# Patient Record
Sex: Female | Born: 1950 | Race: White | Hispanic: No | Smoking: Never smoker
Health system: Southern US, Community
[De-identification: ages and names within clinical notes are randomized; demographics above are authoritative.]

## PROBLEM LIST (undated history)

## (undated) DIAGNOSIS — J329 Chronic sinusitis, unspecified: Secondary | ICD-10-CM

## (undated) DIAGNOSIS — R079 Chest pain, unspecified: Secondary | ICD-10-CM

## (undated) DIAGNOSIS — E785 Hyperlipidemia, unspecified: Secondary | ICD-10-CM

## (undated) DIAGNOSIS — I050 Rheumatic mitral stenosis: Secondary | ICD-10-CM

## (undated) DIAGNOSIS — Z8719 Personal history of other diseases of the digestive system: Secondary | ICD-10-CM

## (undated) DIAGNOSIS — I77810 Thoracic aortic ectasia: Secondary | ICD-10-CM

## (undated) DIAGNOSIS — M797 Fibromyalgia: Secondary | ICD-10-CM

## (undated) DIAGNOSIS — N135 Crossing vessel and stricture of ureter without hydronephrosis: Secondary | ICD-10-CM

## (undated) DIAGNOSIS — M8580 Other specified disorders of bone density and structure, unspecified site: Secondary | ICD-10-CM

## (undated) DIAGNOSIS — M1990 Unspecified osteoarthritis, unspecified site: Secondary | ICD-10-CM

## (undated) DIAGNOSIS — I700 Atherosclerosis of aorta: Secondary | ICD-10-CM

## (undated) DIAGNOSIS — I6529 Occlusion and stenosis of unspecified carotid artery: Secondary | ICD-10-CM

## (undated) DIAGNOSIS — I350 Nonrheumatic aortic (valve) stenosis: Secondary | ICD-10-CM

## (undated) DIAGNOSIS — R011 Cardiac murmur, unspecified: Secondary | ICD-10-CM

## (undated) DIAGNOSIS — E039 Hypothyroidism, unspecified: Secondary | ICD-10-CM

## (undated) DIAGNOSIS — I38 Endocarditis, valve unspecified: Secondary | ICD-10-CM

## (undated) DIAGNOSIS — J189 Pneumonia, unspecified organism: Secondary | ICD-10-CM

## (undated) DIAGNOSIS — I351 Nonrheumatic aortic (valve) insufficiency: Secondary | ICD-10-CM

## (undated) DIAGNOSIS — I77819 Aortic ectasia, unspecified site: Secondary | ICD-10-CM

## (undated) DIAGNOSIS — Z87440 Personal history of urinary (tract) infections: Secondary | ICD-10-CM

## (undated) DIAGNOSIS — I10 Essential (primary) hypertension: Secondary | ICD-10-CM

## (undated) HISTORY — PX: OTHER SURGICAL HISTORY: SHX169

## (undated) HISTORY — DX: Occlusion and stenosis of unspecified carotid artery: I65.29

## (undated) HISTORY — DX: Nonrheumatic aortic (valve) insufficiency: I35.1

## (undated) HISTORY — DX: Thoracic aortic ectasia: I77.810

## (undated) HISTORY — PX: JOINT REPLACEMENT: SHX530

## (undated) HISTORY — DX: Aortic ectasia, unspecified site: I77.819

## (undated) HISTORY — PX: BUNIONECTOMY: SHX129

## (undated) HISTORY — DX: Other specified disorders of bone density and structure, unspecified site: M85.80

## (undated) HISTORY — PX: TUBAL LIGATION: SHX77

## (undated) HISTORY — DX: Rheumatic mitral stenosis: I05.0

## (undated) HISTORY — DX: Atherosclerosis of aorta: I70.0

## (undated) HISTORY — PX: CARDIAC CATHETERIZATION: SHX172

---
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
## 1976-09-04 HISTORY — PX: APPENDECTOMY: SHX54

## 2011-04-23 ENCOUNTER — Emergency Department (HOSPITAL_COMMUNITY)
Admission: EM | Admit: 2011-04-23 | Discharge: 2011-04-23 | Disposition: A | Discharge status: Home / Self Care | Payer: PRIVATE HEALTH INSURANCE | Attending: Emergency Medicine | Admitting: Emergency Medicine

## 2011-04-23 ENCOUNTER — Emergency Department (HOSPITAL_COMMUNITY): Payer: PRIVATE HEALTH INSURANCE

## 2011-04-23 DIAGNOSIS — IMO0002 Reserved for concepts with insufficient information to code with codable children: Secondary | ICD-10-CM | POA: Insufficient documentation

## 2011-04-23 DIAGNOSIS — M549 Dorsalgia, unspecified: Secondary | ICD-10-CM | POA: Insufficient documentation

## 2011-04-23 DIAGNOSIS — I10 Essential (primary) hypertension: Secondary | ICD-10-CM | POA: Insufficient documentation

## 2011-04-23 DIAGNOSIS — E039 Hypothyroidism, unspecified: Secondary | ICD-10-CM | POA: Insufficient documentation

## 2011-04-23 DIAGNOSIS — Z79899 Other long term (current) drug therapy: Secondary | ICD-10-CM | POA: Insufficient documentation

## 2011-04-23 DIAGNOSIS — E119 Type 2 diabetes mellitus without complications: Secondary | ICD-10-CM | POA: Insufficient documentation

## 2016-09-13 DIAGNOSIS — I10 Essential (primary) hypertension: Secondary | ICD-10-CM | POA: Diagnosis not present

## 2016-09-13 DIAGNOSIS — E785 Hyperlipidemia, unspecified: Secondary | ICD-10-CM | POA: Diagnosis not present

## 2016-09-13 DIAGNOSIS — M5412 Radiculopathy, cervical region: Secondary | ICD-10-CM | POA: Diagnosis not present

## 2016-09-13 DIAGNOSIS — Z6833 Body mass index (BMI) 33.0-33.9, adult: Secondary | ICD-10-CM | POA: Diagnosis not present

## 2016-09-13 DIAGNOSIS — E063 Autoimmune thyroiditis: Secondary | ICD-10-CM | POA: Diagnosis not present

## 2016-09-21 DIAGNOSIS — M5023 Other cervical disc displacement, cervicothoracic region: Secondary | ICD-10-CM | POA: Diagnosis not present

## 2016-09-21 DIAGNOSIS — M542 Cervicalgia: Secondary | ICD-10-CM | POA: Diagnosis not present

## 2016-09-22 ENCOUNTER — Other Ambulatory Visit: Payer: Self-pay | Admitting: Neurosurgery

## 2016-09-25 DIAGNOSIS — L821 Other seborrheic keratosis: Secondary | ICD-10-CM | POA: Diagnosis not present

## 2016-09-25 DIAGNOSIS — L219 Seborrheic dermatitis, unspecified: Secondary | ICD-10-CM | POA: Diagnosis not present

## 2016-09-25 DIAGNOSIS — L300 Nummular dermatitis: Secondary | ICD-10-CM | POA: Diagnosis not present

## 2016-09-25 DIAGNOSIS — R233 Spontaneous ecchymoses: Secondary | ICD-10-CM | POA: Diagnosis not present

## 2016-09-26 ENCOUNTER — Encounter (HOSPITAL_COMMUNITY): Payer: Self-pay | Admitting: *Deleted

## 2016-09-26 ENCOUNTER — Encounter (HOSPITAL_COMMUNITY)
Admission: RE | Admit: 2016-09-26 | Discharge: 2016-09-26 | Disposition: A | Discharge status: Home / Self Care | Payer: Medicare Other | Source: Ambulatory Visit | Attending: Neurosurgery | Admitting: Neurosurgery

## 2016-09-26 ENCOUNTER — Encounter (HOSPITAL_COMMUNITY): Payer: Self-pay | Admitting: Vascular Surgery

## 2016-09-26 ENCOUNTER — Other Ambulatory Visit: Payer: Self-pay | Admitting: Neurosurgery

## 2016-09-26 DIAGNOSIS — Z01812 Encounter for preprocedural laboratory examination: Secondary | ICD-10-CM | POA: Diagnosis not present

## 2016-09-26 DIAGNOSIS — Z01810 Encounter for preprocedural cardiovascular examination: Secondary | ICD-10-CM | POA: Diagnosis not present

## 2016-09-26 HISTORY — DX: Personal history of urinary (tract) infections: Z87.440

## 2016-09-26 HISTORY — DX: Hyperlipidemia, unspecified: E78.5

## 2016-09-26 HISTORY — DX: Cardiac murmur, unspecified: R01.1

## 2016-09-26 HISTORY — DX: Pneumonia, unspecified organism: J18.9

## 2016-09-26 HISTORY — DX: Unspecified osteoarthritis, unspecified site: M19.90

## 2016-09-26 HISTORY — DX: Essential (primary) hypertension: I10

## 2016-09-26 HISTORY — DX: Hypothyroidism, unspecified: E03.9

## 2016-09-26 LAB — CBC
HCT: 40.1 % (ref 36.0–46.0)
Hemoglobin: 14.1 g/dL (ref 12.0–15.0)
MCH: 29.6 pg (ref 26.0–34.0)
MCHC: 35.2 g/dL (ref 30.0–36.0)
MCV: 84.2 fL (ref 78.0–100.0)
PLATELETS: 151 10*3/uL (ref 150–400)
RBC: 4.76 MIL/uL (ref 3.87–5.11)
RDW: 12.5 % (ref 11.5–15.5)
WBC: 5.4 10*3/uL (ref 4.0–10.5)

## 2016-09-26 LAB — BASIC METABOLIC PANEL
Anion gap: 9 (ref 5–15)
BUN: 14 mg/dL (ref 6–20)
CALCIUM: 9.5 mg/dL (ref 8.9–10.3)
CO2: 25 mmol/L (ref 22–32)
CREATININE: 0.5 mg/dL (ref 0.44–1.00)
Chloride: 102 mmol/L (ref 101–111)
Glucose, Bld: 117 mg/dL — ABNORMAL HIGH (ref 65–99)
Potassium: 3.7 mmol/L (ref 3.5–5.1)
SODIUM: 136 mmol/L (ref 135–145)

## 2016-09-26 LAB — SURGICAL PCR SCREEN
MRSA, PCR: NEGATIVE
STAPHYLOCOCCUS AUREUS: POSITIVE — AB

## 2016-09-26 NOTE — Progress Notes (Signed)
Anesthesia chart review: Patient is a 66 year old female scheduled for C4-5, C5-6, C6-7 ACDF on 09/27/2016 by Dr. Saintclair Halsted.  History includes never smoker, hypertension, dyslipidemia, murmur, hypothyroidism, arthritis, appendectomy. She reported have right sided chest pressure "behind her heart, but in front of her shoulder blades since last year." This as associated with neck and right shoulder pain. She had an echo on January 2017 that showed normal LVEF without significant valvular abnormality. By August 2017 when visiting her daughter in [place], she was having worsening symptoms and they thought she initially had gallbladder disease, but ultrasound showed unremarkable gallbladder and common bile duct. There was a possible liver lesion with further evaluation recommended. (Of note, patient tells me they told her her spleen was enlarged and should eventually see hematology to rule out lymphoma. I do not see a CT scan reports, but her spleen was not documented as enlarged on U/S, although splenic granulomas noted. She is planning to get this further evaluated after recovery from this surgery.) Her right chest pain is constant since at least August and now associated with more severe neck pain with radiation down both arms (but still worse RUE) with associated paresthesias. Pain is not associated with activity. Mild relief with a heating pad. She is limited with activity due to arthritis in her knees, but was able to walk from the parking deck to the hospital. She denied SOB, palpitations, syncope, edema. She tells me that multiple physicians have told her that they do not feel that her symptoms are coming from her heart. Reportedly, Dr. Saintclair Halsted thinks this surgery will help. No stress test has been recommended. She denied known personal history of CAD, MI, DM, and smoking. There is a family history of CAD in both her parents after the age of 47.   Patient's phone number is [phone].  PCP is Dr. Wende Neighbors. I called  and spoke with Dr. Micheal Likens about surgery plans and symptoms. He knows that patient has had persistent right shoulder/upper chest pain. He did not feel the symptoms she described to him were worrisome for cardiac etiology. He thinks most prominent risk factor is dyslipidemia (reports LDL 254 in August). Other risk factors include HTN and family history. He had not planned on ordering a stress test, but if our anesthesiologist recommended further evaluation then he would be able to assist with arranging whatever was felt needed.   Meds include aspirin 81 mg (on hold), cod liver oil, levothyroxine, losartan-HCTZ, Toprol-XL, tramadol.  BP (!) 154/62   Pulse 60   Temp 36.5 C   Resp 18   Ht 5\' 3"  (1.6 m)   Wt 189 lb (85.7 kg)   SpO2 98%   BMI 33.48 kg/m   EKG 1/23/18P: NSR, cannot rule out inferior infarct (age undetermined). Currently, there are no comparison tracings.   Echo 09/30/15 ([place] [facility]; Care Everywhere): Summary Mild left ventricular hypertrophy. Normal left ventricular systolic function with no appreciable segmental abnormality. Ejection fraction is visually estimated at 55-60% There is mild aortic sclerosis noted, with no evidence of stenosis. Trace aortic regurgitation by color flow doppler examination. Mild mitral annular calcification. Trace mitral regurgitation by color flow doppler examination.  She reported a stress test > 5 years ago at [facility]. Patient says it was ordered more for routine purposes and also told she had a murmur. She was told it was normal except for a few "benign PVCs."   CXR 06/02/16 [facility]; PACS): Findings: The heart size and mediastinal contours are within  normal limits. Both lungs are clear. The visualized skeletal structures are unremarkable. Impression: No acute cardiopulmonary disease.  MRI C-spine 05/16/16 [facility]; PACS): Impression: 1. Multilevel spondylosis, most severe in acute at C6-7 where a central and  rightward disc extrusion along the caudally migrated fragment compresses the right C7 nerve root. 2. Similar less severe changes at C4-5 and C5-6, also worse to the right. 3. Severe facet arthropathy at C2-3 on the right without neural impingement. 4. If strong concern for compressive left-sided lesion, and further investigation desired, consider cervical myelogram and post monogram CT.  Gallbladder U/S 04/25/16 [facility] seen in [place]): FINDINGS: The aorta, IVC, pancreas, gallbladder, common bile duct, kidneys are within normal limits. Spleen with a few granulomas. The liver is heterogeneous. Lobular area seen in the right lobe posteriorly measures 4 cm.Impressions: Possible liver lesion. Recommend CT scan with contrast.  Preoperative labs noted.   Above reviewed with anesthesiologist Dr. Linna Caprice. Since patient has experienced chest symptoms and has multiple CAD risk factors, he would recommend pre-operative cardiology evaluation or stress stress test prior to elective surgery. I have notified Lorriane Shire at Dr. Windy Carina office. She will review with him. He can call Dr. Linna Caprice if he wishes to discuss further.   [facility]/Anesthesiology Phone [phone] 09/26/2016 3:13 PM

## 2016-09-26 NOTE — Progress Notes (Signed)
Allergy to pcn- ancef ordered Called office spoke with vanessa in office. Will order vanco

## 2016-09-26 NOTE — Pre-Procedure Instructions (Addendum)
Erica Calhoun  09/26/2016      [place], [place] [address] Phone: [phone] Fax: [phone]    Your procedure is scheduled on Wednesday, January 24.  Report to [facility] Admitting at 11:00 AM .              Your surgery or procedure is scheduled for 1:00 PM   Call this number if you have problems the morning of surgery: [phone]    Remember:  Do not eat food or drink liquids after midnight.  Take these medicines the morning of surgery with A SIP OF WATER :levothyroxine (SYNTHROID, LEVOTHROID), metoprolol succinate (TOPROL-XL).                Take if needed: traMADol Erica Calhoun).                 STOP taking Aspirin , Aspirin Products (Goody Powder, Excedrin Migraine), Ibuprofen (Advil), Naproxen (Aleve), all Vitamins and Herbal Products (ie Fish Oil)/all supplements                [place]- Preparing For Surgery  Before surgery, you can play an important role. Because skin is not sterile, your skin needs to be as free of germs as possible. You can reduce the number of germs on your skin by washing with CHG (chlorahexidine gluconate) Soap before surgery.  CHG is an antiseptic cleaner which kills germs and bonds with the skin to continue killing germs even after washing.  Please do not use if you have an allergy to CHG or antibacterial soaps. If your skin becomes reddened/irritated stop using the CHG.  Do not shave (including legs and underarms) for at least 48 hours prior to first CHG shower. It is OK to shave your face.  Please follow these instructions carefully.   1. Shower the NIGHT BEFORE SURGERY and the MORNING OF SURGERY with CHG.   2. If you chose to wash your hair, wash your hair first as usual with your normal shampoo.  3. After you shampoo, rinse your hair and body thoroughly to remove the shampoo.  4. Use CHG as you would any other liquid soap. You can apply CHG  directly to the skin and wash gently with a scrungie or a clean washcloth.   5. Apply the CHG Soap to your body ONLY FROM THE NECK DOWN.  Do not use on open wounds or open sores. Avoid contact with your eyes, ears, mouth and genitals (private parts). Wash genitals (private parts) with your normal soap.  6. Wash thoroughly, paying special attention to the area where your surgery will be performed.  7. Thoroughly rinse your body with warm water from the neck down.  8. DO NOT shower/wash with your normal soap after using and rinsing off the CHG Soap.  9. Pat yourself dry with a CLEAN TOWEL.   10. Wear CLEAN PAJAMAS   11. Place CLEAN SHEETS on your bed the night of your first shower and DO NOT SLEEP WITH PETS.  Day of Surgery: Do not apply any deodorants/lotions. Please wear clean clothes to the hospital/surgery center.   Do not wear jewelry, make-up or nail polish.  Do not wear lotions, powders, or perfumes, or deodorant.  Do not shave 48 hours prior to surgery.   Do not bring valuables to the hospital.  [facility] is not responsible for any belongings or valuables.  Contacts, dentures or bridgework may not be worn into surgery.  Leave your suitcase in the car.  After surgery it may be brought to your room.  For patients admitted to the hospital, discharge time will be determined by your treatment team.  Patients discharged the day of surgery will not be allowed to drive home.   Name and phone number of your driver:     Special instructions:  -  Please read over the  fact sheets that you were given:

## 2016-09-27 ENCOUNTER — Encounter (HOSPITAL_COMMUNITY): Admission: RE | Payer: Self-pay | Source: Ambulatory Visit

## 2016-09-27 ENCOUNTER — Inpatient Hospital Stay (HOSPITAL_COMMUNITY): Admission: RE | Admit: 2016-09-27 | Payer: Medicare Other | Source: Ambulatory Visit | Admitting: Neurosurgery

## 2016-09-27 SURGERY — ANTERIOR CERVICAL DECOMPRESSION/DISCECTOMY FUSION 3 LEVELS
Anesthesia: General

## 2016-09-28 DIAGNOSIS — E039 Hypothyroidism, unspecified: Secondary | ICD-10-CM | POA: Diagnosis not present

## 2016-09-28 DIAGNOSIS — I10 Essential (primary) hypertension: Secondary | ICD-10-CM | POA: Diagnosis not present

## 2016-09-28 DIAGNOSIS — E785 Hyperlipidemia, unspecified: Secondary | ICD-10-CM | POA: Diagnosis not present

## 2016-09-28 DIAGNOSIS — Z01810 Encounter for preprocedural cardiovascular examination: Secondary | ICD-10-CM | POA: Diagnosis not present

## 2016-10-02 ENCOUNTER — Inpatient Hospital Stay (HOSPITAL_COMMUNITY): Payer: Medicare Other

## 2016-10-02 ENCOUNTER — Encounter (HOSPITAL_COMMUNITY)
Admission: RE | Disposition: A | Discharge status: Home / Self Care | Payer: Self-pay | Source: Ambulatory Visit | Attending: Neurosurgery

## 2016-10-02 ENCOUNTER — Inpatient Hospital Stay (HOSPITAL_COMMUNITY): Payer: Medicare Other | Admitting: Vascular Surgery

## 2016-10-02 ENCOUNTER — Encounter (HOSPITAL_COMMUNITY): Payer: Self-pay | Admitting: *Deleted

## 2016-10-02 ENCOUNTER — Inpatient Hospital Stay (HOSPITAL_COMMUNITY)
Admission: RE | Admit: 2016-10-02 | Discharge: 2016-10-03 | DRG: 473 | Disposition: A | Discharge status: Home / Self Care | Payer: Medicare Other | Source: Ambulatory Visit | Attending: Neurosurgery | Admitting: Neurosurgery

## 2016-10-02 DIAGNOSIS — Z7982 Long term (current) use of aspirin: Secondary | ICD-10-CM

## 2016-10-02 DIAGNOSIS — M50222 Other cervical disc displacement at C5-C6 level: Secondary | ICD-10-CM | POA: Diagnosis not present

## 2016-10-02 DIAGNOSIS — Z886 Allergy status to analgesic agent status: Secondary | ICD-10-CM | POA: Diagnosis not present

## 2016-10-02 DIAGNOSIS — Z79899 Other long term (current) drug therapy: Secondary | ICD-10-CM

## 2016-10-02 DIAGNOSIS — E669 Obesity, unspecified: Secondary | ICD-10-CM | POA: Diagnosis present

## 2016-10-02 DIAGNOSIS — Z6833 Body mass index (BMI) 33.0-33.9, adult: Secondary | ICD-10-CM

## 2016-10-02 DIAGNOSIS — Z880 Allergy status to penicillin: Secondary | ICD-10-CM | POA: Diagnosis not present

## 2016-10-02 DIAGNOSIS — M5022 Other cervical disc displacement, mid-cervical region, unspecified level: Secondary | ICD-10-CM | POA: Diagnosis present

## 2016-10-02 DIAGNOSIS — M4802 Spinal stenosis, cervical region: Secondary | ICD-10-CM | POA: Diagnosis present

## 2016-10-02 DIAGNOSIS — I10 Essential (primary) hypertension: Secondary | ICD-10-CM | POA: Diagnosis present

## 2016-10-02 DIAGNOSIS — M50121 Cervical disc disorder at C4-C5 level with radiculopathy: Secondary | ICD-10-CM | POA: Diagnosis present

## 2016-10-02 DIAGNOSIS — M4722 Other spondylosis with radiculopathy, cervical region: Principal | ICD-10-CM | POA: Diagnosis present

## 2016-10-02 DIAGNOSIS — E039 Hypothyroidism, unspecified: Secondary | ICD-10-CM | POA: Diagnosis not present

## 2016-10-02 DIAGNOSIS — M50221 Other cervical disc displacement at C4-C5 level: Secondary | ICD-10-CM | POA: Diagnosis not present

## 2016-10-02 DIAGNOSIS — M4322 Fusion of spine, cervical region: Secondary | ICD-10-CM | POA: Diagnosis not present

## 2016-10-02 DIAGNOSIS — Z791 Long term (current) use of non-steroidal anti-inflammatories (NSAID): Secondary | ICD-10-CM

## 2016-10-02 DIAGNOSIS — Z885 Allergy status to narcotic agent status: Secondary | ICD-10-CM | POA: Diagnosis not present

## 2016-10-02 DIAGNOSIS — M4803 Spinal stenosis, cervicothoracic region: Secondary | ICD-10-CM | POA: Diagnosis not present

## 2016-10-02 DIAGNOSIS — E785 Hyperlipidemia, unspecified: Secondary | ICD-10-CM | POA: Diagnosis present

## 2016-10-02 DIAGNOSIS — Z91048 Other nonmedicinal substance allergy status: Secondary | ICD-10-CM | POA: Diagnosis not present

## 2016-10-02 DIAGNOSIS — Z419 Encounter for procedure for purposes other than remedying health state, unspecified: Secondary | ICD-10-CM

## 2016-10-02 DIAGNOSIS — M50223 Other cervical disc displacement at C6-C7 level: Secondary | ICD-10-CM | POA: Diagnosis not present

## 2016-10-02 DIAGNOSIS — M5020 Other cervical disc displacement, unspecified cervical region: Secondary | ICD-10-CM | POA: Diagnosis present

## 2016-10-02 HISTORY — PX: ANTERIOR CERVICAL DECOMP/DISCECTOMY FUSION: SHX1161

## 2016-10-02 SURGERY — ANTERIOR CERVICAL DECOMPRESSION/DISCECTOMY FUSION 3 LEVELS
Anesthesia: General

## 2016-10-02 MED ORDER — SODIUM CHLORIDE 0.9 % IV SOLN: 250.0000 mL | INTRAVENOUS | Status: DC

## 2016-10-02 MED ORDER — 0.9 % SODIUM CHLORIDE (POUR BTL) OPTIME
TOPICAL | Status: DC | PRN
  Administered 2016-10-02: 1000 mL

## 2016-10-02 MED ORDER — COD LIVER OIL 1000 MG PO CAPS: 1000.0000 mg | ORAL_CAPSULE | Freq: Two times a day (BID) | ORAL | Status: DC

## 2016-10-02 MED ORDER — FENTANYL CITRATE (PF) 100 MCG/2ML IJ SOLN
INTRAMUSCULAR | Status: DC | PRN
  Administered 2016-10-02 (×3): 50 ug via INTRAVENOUS
  Administered 2016-10-02: 100 ug via INTRAVENOUS

## 2016-10-02 MED ORDER — OXYCODONE-ACETAMINOPHEN 5-325 MG PO TABS
1.0000 | ORAL_TABLET | ORAL | Status: DC | PRN
  Administered 2016-10-02 – 2016-10-03 (×5): 2 via ORAL
  Filled 2016-10-02 (×4): qty 2

## 2016-10-02 MED ORDER — MIDAZOLAM HCL 5 MG/5ML IJ SOLN
INTRAMUSCULAR | Status: DC | PRN
  Administered 2016-10-02: 2 mg via INTRAVENOUS

## 2016-10-02 MED ORDER — SODIUM CHLORIDE 0.9% FLUSH: 3.0000 mL | Freq: Two times a day (BID) | INTRAVENOUS | Status: DC

## 2016-10-02 MED ORDER — ROCURONIUM BROMIDE 100 MG/10ML IV SOLN
INTRAVENOUS | Status: DC | PRN
Start: 2016-10-02 — End: 2016-10-02
  Administered 2016-10-02: 50 mg via INTRAVENOUS
  Administered 2016-10-02: 20 mg via INTRAVENOUS

## 2016-10-02 MED ORDER — ACETAMINOPHEN 325 MG PO TABS: 650.0000 mg | ORAL_TABLET | ORAL | Status: DC | PRN

## 2016-10-02 MED ORDER — FENTANYL CITRATE (PF) 100 MCG/2ML IJ SOLN
INTRAMUSCULAR | Status: AC
  Filled 2016-10-02: qty 2

## 2016-10-02 MED ORDER — HYPROMELLOSE (GONIOSCOPIC) 2.5 % OP SOLN
1.0000 [drp] | Freq: Three times a day (TID) | OPHTHALMIC | Status: DC | PRN
  Filled 2016-10-02: qty 15

## 2016-10-02 MED ORDER — SODIUM CHLORIDE 0.9% FLUSH: 3.0000 mL | INTRAVENOUS | Status: DC | PRN

## 2016-10-02 MED ORDER — THROMBIN 20000 UNITS EX SOLR
CUTANEOUS | Status: AC
  Filled 2016-10-02: qty 20000

## 2016-10-02 MED ORDER — LEVOTHYROXINE SODIUM 88 MCG PO TABS
88.0000 ug | ORAL_TABLET | Freq: Every day | ORAL | Status: DC
  Administered 2016-10-03: 88 ug via ORAL
  Filled 2016-10-02: qty 1

## 2016-10-02 MED ORDER — DEXTROSE 5 % IV SOLN
INTRAVENOUS | Status: DC | PRN
  Administered 2016-10-02: 25 ug/min via INTRAVENOUS

## 2016-10-02 MED ORDER — HYDROCHLOROTHIAZIDE 12.5 MG PO CAPS
12.5000 mg | ORAL_CAPSULE | Freq: Every day | ORAL | Status: DC
  Administered 2016-10-02 – 2016-10-03 (×2): 12.5 mg via ORAL
  Filled 2016-10-02 (×2): qty 1

## 2016-10-02 MED ORDER — PROPOFOL 10 MG/ML IV BOLUS
INTRAVENOUS | Status: AC
  Filled 2016-10-02: qty 20

## 2016-10-02 MED ORDER — ADULT MULTIVITAMIN W/MINERALS CH
1.0000 | ORAL_TABLET | Freq: Every day | ORAL | Status: DC
  Administered 2016-10-02: 1 via ORAL
  Filled 2016-10-02: qty 1

## 2016-10-02 MED ORDER — GLUCOSAMINE CHONDR 1500 COMPLX PO CAPS: 1.0000 | ORAL_CAPSULE | Freq: Two times a day (BID) | ORAL | Status: DC

## 2016-10-02 MED ORDER — EPHEDRINE SULFATE 50 MG/ML IJ SOLN
INTRAMUSCULAR | Status: DC | PRN
  Administered 2016-10-02 (×2): 5 mg via INTRAVENOUS

## 2016-10-02 MED ORDER — HYDROMORPHONE HCL 1 MG/ML IJ SOLN
0.2500 mg | INTRAMUSCULAR | Status: DC | PRN
  Administered 2016-10-02 (×4): 0.5 mg via INTRAVENOUS

## 2016-10-02 MED ORDER — THROMBIN 5000 UNITS EX SOLR
OROMUCOSAL | Status: DC | PRN
  Administered 2016-10-02 (×2): via TOPICAL

## 2016-10-02 MED ORDER — FENTANYL CITRATE (PF) 100 MCG/2ML IJ SOLN
INTRAMUSCULAR | Status: AC
  Filled 2016-10-02: qty 4

## 2016-10-02 MED ORDER — PANTOPRAZOLE SODIUM 40 MG IV SOLR
40.0000 mg | Freq: Every day | INTRAVENOUS | Status: DC
  Administered 2016-10-02: 40 mg via INTRAVENOUS
  Filled 2016-10-02: qty 40

## 2016-10-02 MED ORDER — ONDANSETRON HCL 4 MG/2ML IJ SOLN: 4.0000 mg | INTRAMUSCULAR | Status: DC | PRN

## 2016-10-02 MED ORDER — CYCLOBENZAPRINE HCL 10 MG PO TABS
ORAL_TABLET | ORAL | Status: AC
  Filled 2016-10-02: qty 1

## 2016-10-02 MED ORDER — LACTATED RINGERS IV SOLN
INTRAVENOUS | Status: DC
  Administered 2016-10-02 (×2): via INTRAVENOUS

## 2016-10-02 MED ORDER — ACETAMINOPHEN 650 MG RE SUPP: 650.0000 mg | RECTAL | Status: DC | PRN

## 2016-10-02 MED ORDER — LOSARTAN POTASSIUM 50 MG PO TABS
100.0000 mg | ORAL_TABLET | Freq: Every day | ORAL | Status: DC
  Administered 2016-10-02 – 2016-10-03 (×2): 100 mg via ORAL
  Filled 2016-10-02 (×2): qty 2

## 2016-10-02 MED ORDER — METOPROLOL SUCCINATE ER 25 MG PO TB24
25.0000 mg | ORAL_TABLET | Freq: Every day | ORAL | Status: DC
  Administered 2016-10-03: 25 mg via ORAL
  Filled 2016-10-02: qty 1

## 2016-10-02 MED ORDER — THROMBIN 20000 UNITS EX SOLR
OROMUCOSAL | Status: DC | PRN
  Administered 2016-10-02: 16:00:00 via TOPICAL

## 2016-10-02 MED ORDER — ONDANSETRON HCL 4 MG/2ML IJ SOLN
INTRAMUSCULAR | Status: AC
  Filled 2016-10-02: qty 2

## 2016-10-02 MED ORDER — VANCOMYCIN HCL IN DEXTROSE 1-5 GM/200ML-% IV SOLN
1000.0000 mg | Freq: Two times a day (BID) | INTRAVENOUS | Status: DC
  Administered 2016-10-03: 1000 mg via INTRAVENOUS
  Filled 2016-10-02: qty 200

## 2016-10-02 MED ORDER — HYDROMORPHONE HCL 1 MG/ML IJ SOLN: 0.5000 mg | INTRAMUSCULAR | Status: DC | PRN

## 2016-10-02 MED ORDER — MUPIROCIN 2 % EX OINT
1.0000 "application " | TOPICAL_OINTMENT | Freq: Two times a day (BID) | CUTANEOUS | Status: DC
  Administered 2016-10-02 – 2016-10-03 (×2): 1 via NASAL
  Filled 2016-10-02: qty 22

## 2016-10-02 MED ORDER — LOSARTAN POTASSIUM-HCTZ 100-12.5 MG PO TABS: 1.0000 | ORAL_TABLET | Freq: Every evening | ORAL | Status: DC

## 2016-10-02 MED ORDER — MIDAZOLAM HCL 2 MG/2ML IJ SOLN
INTRAMUSCULAR | Status: AC
  Filled 2016-10-02: qty 2

## 2016-10-02 MED ORDER — CYCLOBENZAPRINE HCL 10 MG PO TABS
10.0000 mg | ORAL_TABLET | Freq: Three times a day (TID) | ORAL | Status: DC | PRN
Start: 2016-10-02 — End: 2016-10-03
  Administered 2016-10-02 – 2016-10-03 (×2): 10 mg via ORAL
  Filled 2016-10-02: qty 1

## 2016-10-02 MED ORDER — PROMETHAZINE HCL 25 MG/ML IJ SOLN: 6.2500 mg | INTRAMUSCULAR | Status: DC | PRN

## 2016-10-02 MED ORDER — MENTHOL 3 MG MT LOZG: 1.0000 | LOZENGE | OROMUCOSAL | Status: DC | PRN

## 2016-10-02 MED ORDER — THROMBIN 5000 UNITS EX SOLR
CUTANEOUS | Status: AC
  Filled 2016-10-02: qty 5000

## 2016-10-02 MED ORDER — VANCOMYCIN HCL 1000 MG IV SOLR
INTRAVENOUS | Status: DC | PRN
  Administered 2016-10-02: 1000 mg via INTRAVENOUS

## 2016-10-02 MED ORDER — OXYCODONE-ACETAMINOPHEN 5-325 MG PO TABS
ORAL_TABLET | ORAL | Status: AC
  Filled 2016-10-02: qty 2

## 2016-10-02 MED ORDER — ASPIRIN EC 81 MG PO TBEC
81.0000 mg | DELAYED_RELEASE_TABLET | Freq: Every day | ORAL | Status: DC
  Administered 2016-10-03: 81 mg via ORAL
  Filled 2016-10-02: qty 1

## 2016-10-02 MED ORDER — ALUM & MAG HYDROXIDE-SIMETH 200-200-20 MG/5ML PO SUSP: 30.0000 mL | Freq: Four times a day (QID) | ORAL | Status: DC | PRN

## 2016-10-02 MED ORDER — HYDROMORPHONE HCL 1 MG/ML IJ SOLN
INTRAMUSCULAR | Status: AC
  Filled 2016-10-02: qty 1

## 2016-10-02 MED ORDER — PROPOFOL 10 MG/ML IV BOLUS
INTRAVENOUS | Status: DC | PRN
  Administered 2016-10-02: 200 mg via INTRAVENOUS

## 2016-10-02 MED ORDER — TRAMADOL HCL 50 MG PO TABS: 50.0000 mg | ORAL_TABLET | Freq: Three times a day (TID) | ORAL | Status: DC | PRN

## 2016-10-02 MED ORDER — DEXAMETHASONE SODIUM PHOSPHATE 4 MG/ML IJ SOLN
INTRAMUSCULAR | Status: DC | PRN
  Administered 2016-10-02: 10 mg via INTRAVENOUS

## 2016-10-02 MED ORDER — SUGAMMADEX SODIUM 200 MG/2ML IV SOLN
INTRAVENOUS | Status: DC | PRN
  Administered 2016-10-02: 200 mg via INTRAVENOUS

## 2016-10-02 MED ORDER — LIDOCAINE HCL (CARDIAC) 20 MG/ML IV SOLN
INTRAVENOUS | Status: DC | PRN
  Administered 2016-10-02: 100 mg via INTRAVENOUS

## 2016-10-02 MED ORDER — ONDANSETRON HCL 4 MG/2ML IJ SOLN
INTRAMUSCULAR | Status: DC | PRN
  Administered 2016-10-02: 4 mg via INTRAVENOUS

## 2016-10-02 MED ORDER — PHENOL 1.4 % MT LIQD: 1.0000 | OROMUCOSAL | Status: DC | PRN

## 2016-10-02 MED ORDER — MEPERIDINE HCL 25 MG/ML IJ SOLN: 6.2500 mg | INTRAMUSCULAR | Status: DC | PRN

## 2016-10-02 MED ORDER — SODIUM CHLORIDE 0.9 % IR SOLN
Status: DC | PRN
  Administered 2016-10-02: 16:00:00

## 2016-10-02 SURGICAL SUPPLY — 66 items
ADH SKN CLS APL DERMABOND .7 (GAUZE/BANDAGES/DRESSINGS) ×1
ALLOGRAFT CA 6X14X11 (Bone Implant) ×2 IMPLANT
ALLOGRAFT CA 6X14X11MM (Bone Implant) ×2 IMPLANT
APL SKNCLS STERI-STRIP NONHPOA (GAUZE/BANDAGES/DRESSINGS) ×1
BAG DECANTER FOR FLEXI CONT (MISCELLANEOUS) ×3 IMPLANT
BENZOIN TINCTURE PRP APPL 2/3 (GAUZE/BANDAGES/DRESSINGS) ×3 IMPLANT
BIT DRILL 13 (BIT) ×1 IMPLANT
BIT DRILL 13MM (BIT) ×1
BONE CA 8X14X11MM (Bone Implant) ×1 IMPLANT
BUR MATCHSTICK NEURO 3.0 LAGG (BURR) ×3 IMPLANT
CANISTER SUCT 3000ML PPV (MISCELLANEOUS) ×3 IMPLANT
CARTRIDGE OIL MAESTRO DRILL (MISCELLANEOUS) ×1 IMPLANT
CLOSURE WOUND 1/2 X4 (GAUZE/BANDAGES/DRESSINGS) ×1
DECANTER SPIKE VIAL GLASS SM (MISCELLANEOUS) ×3 IMPLANT
DERMABOND ADVANCED (GAUZE/BANDAGES/DRESSINGS) ×2
DERMABOND ADVANCED .7 DNX12 (GAUZE/BANDAGES/DRESSINGS) ×1 IMPLANT
DIFFUSER DRILL AIR PNEUMATIC (MISCELLANEOUS) ×3 IMPLANT
DRAIN SNY WOU 7FLT (WOUND CARE) ×4 IMPLANT
DRAPE C-ARM 42X72 X-RAY (DRAPES) ×6 IMPLANT
DRAPE LAPAROTOMY 100X72 PEDS (DRAPES) ×3 IMPLANT
DRAPE MICROSCOPE LEICA (MISCELLANEOUS) ×3 IMPLANT
DRAPE POUCH INSTRU U-SHP 10X18 (DRAPES) ×3 IMPLANT
DRSG OPSITE 4X5.5 SM (GAUZE/BANDAGES/DRESSINGS) ×2 IMPLANT
DRSG OPSITE POSTOP 4X6 (GAUZE/BANDAGES/DRESSINGS) ×2 IMPLANT
DURAPREP 6ML APPLICATOR 50/CS (WOUND CARE) ×3 IMPLANT
ELECT COATED BLADE 2.86 ST (ELECTRODE) ×3 IMPLANT
ELECT REM PT RETURN 9FT ADLT (ELECTROSURGICAL) ×3
ELECTRODE REM PT RTRN 9FT ADLT (ELECTROSURGICAL) ×1 IMPLANT
EVACUATOR SILICONE 100CC (DRAIN) ×2 IMPLANT
GAUZE SPONGE 4X4 12PLY STRL (GAUZE/BANDAGES/DRESSINGS) ×3 IMPLANT
GAUZE SPONGE 4X4 16PLY XRAY LF (GAUZE/BANDAGES/DRESSINGS) IMPLANT
GLOVE BIO SURGEON STRL SZ8 (GLOVE) ×3 IMPLANT
GLOVE EXAM NITRILE LRG STRL (GLOVE) IMPLANT
GLOVE EXAM NITRILE XL STR (GLOVE) IMPLANT
GLOVE EXAM NITRILE XS STR PU (GLOVE) IMPLANT
GLOVE INDICATOR 8.5 STRL (GLOVE) ×3 IMPLANT
GOWN STRL REUS W/ TWL LRG LVL3 (GOWN DISPOSABLE) IMPLANT
GOWN STRL REUS W/ TWL XL LVL3 (GOWN DISPOSABLE) ×1 IMPLANT
GOWN STRL REUS W/TWL 2XL LVL3 (GOWN DISPOSABLE) IMPLANT
GOWN STRL REUS W/TWL LRG LVL3 (GOWN DISPOSABLE)
GOWN STRL REUS W/TWL XL LVL3 (GOWN DISPOSABLE) ×12
GRAFT CORT CANC 14X8.25X11 5D (Bone Implant) ×1 IMPLANT
HALTER HD/CHIN CERV TRACTION D (MISCELLANEOUS) ×3 IMPLANT
HEMOSTAT POWDER KIT SURGIFOAM (HEMOSTASIS) ×2 IMPLANT
KIT BASIN OR (CUSTOM PROCEDURE TRAY) ×3 IMPLANT
KIT ROOM TURNOVER OR (KITS) ×3 IMPLANT
NDL SPNL 20GX3.5 QUINCKE YW (NEEDLE) ×1 IMPLANT
NEEDLE SPNL 20GX3.5 QUINCKE YW (NEEDLE) ×3 IMPLANT
NS IRRIG 1000ML POUR BTL (IV SOLUTION) ×3 IMPLANT
OIL CARTRIDGE MAESTRO DRILL (MISCELLANEOUS) ×3
PACK LAMINECTOMY NEURO (CUSTOM PROCEDURE TRAY) ×3 IMPLANT
PLATE ANT CERVICAL 52.5 (Plate) ×2 IMPLANT
RUBBERBAND STERILE (MISCELLANEOUS) ×6 IMPLANT
SCREW ST 13X4XST VA NS SPNE (Screw) IMPLANT
SCREW ST FIX 4 ATL 3120213 (Screw) ×12 IMPLANT
SCREW ST VAR 4 ATL (Screw) ×6 IMPLANT
SPONGE INTESTINAL PEANUT (DISPOSABLE) ×3 IMPLANT
SPONGE SURGIFOAM ABS GEL 100 (HEMOSTASIS) ×3 IMPLANT
STRIP CLOSURE SKIN 1/2X4 (GAUZE/BANDAGES/DRESSINGS) ×2 IMPLANT
SUT VIC AB 3-0 SH 8-18 (SUTURE) ×5 IMPLANT
SUT VICRYL 4-0 PS2 18IN ABS (SUTURE) ×3 IMPLANT
TAPE CLOTH 4X10 WHT NS (GAUZE/BANDAGES/DRESSINGS) ×2 IMPLANT
TOWEL OR 17X24 6PK STRL BLUE (TOWEL DISPOSABLE) ×3 IMPLANT
TOWEL OR 17X26 10 PK STRL BLUE (TOWEL DISPOSABLE) ×3 IMPLANT
TRAP SPECIMEN MUCOUS 40CC (MISCELLANEOUS) ×3 IMPLANT
WATER STERILE IRR 1000ML POUR (IV SOLUTION) ×3 IMPLANT

## 2016-10-02 NOTE — Transfer of Care (Signed)
Immediate Anesthesia Transfer of Care Note  Patient: Erica Calhoun  Procedure(s) Performed: Procedure(s): Anterior Cervical Diskectomy and Fusion - Cervical four- Cervical five - Cervical five-Cervical six - Cervical six -Cervical seven (N/A)  Patient Location: PACU  Anesthesia Type:General  Level of Consciousness: awake, alert  and oriented  Airway & Oxygen Therapy: Patient Spontanous Breathing and Patient connected to nasal cannula oxygen  Post-op Assessment: Report given to RN and Post -op Vital signs reviewed and stable  Post vital signs: Reviewed and stable  Last Vitals:  Vitals:   10/02/16 1114 10/02/16 1738  BP: (!) 146/57 (!) 146/80  Pulse: 65 77  Resp: 18 (!) 4  Temp: 36.7 C 36.1 C    Last Pain:  Vitals:   10/02/16 1738  TempSrc:   PainSc: 5       Patients Stated Pain Goal: 8 ([id number])  Complications: No apparent anesthesia complications

## 2016-10-02 NOTE — Anesthesia Procedure Notes (Signed)
Procedure Name: Intubation Date/Time: 10/02/2016 2:34 PM Performed by: Clearnce Sorrel Pre-anesthesia Checklist: Patient identified, Emergency Drugs available, Suction available, Patient being monitored and Timeout performed Patient Re-evaluated:Patient Re-evaluated prior to inductionOxygen Delivery Method: Circle system utilized Preoxygenation: Pre-oxygenation with 100% oxygen Intubation Type: IV induction Ventilation: Mask ventilation without difficulty and Oral airway inserted - appropriate to patient size Laryngoscope Size: Mac and 3 Grade View: Grade II Tube type: Oral Tube size: 7.0 mm Number of attempts: 1 Airway Equipment and Method: Stylet Placement Confirmation: ETT inserted through vocal cords under direct vision,  positive ETCO2 and breath sounds checked- equal and bilateral Secured at: 22 cm Tube secured with: Tape Dental Injury: Teeth and Oropharynx as per pre-operative assessment

## 2016-10-02 NOTE — Progress Notes (Signed)
Anesthesia Follow-up: See my anesthesia preoperative note from 09/26/16. Patient was scheduled for C4-5, C5-6, C6-7 ACDF on 09/27/2016 by Dr. Saintclair Halsted. Surgery was postponed to allow for either cardiology evaluation and/or a stress test. Patient was seen by cardiologist Dr. Wynonia Lawman on 09/28/16. He wrote, "At this point I think she is stable to undergo the planned surgery. EKG today personally reviewed by me shows an lV conduction delay, there is a QS pattern in lead 3 but she has a normal echocardiogram at the same time that this was done so I don't really see any change in her EKG or anything that I think needs further testing. I would be interested to see if her symptoms persist or improve after her cervical disc surgery. She should continue her antihypertensive medicines during the time of surgery. She may discontinue aspirin or other anticoagulants if needed in preparation for surgery."  George Hugh [facility]/Anesthesiology Phone [phone] 10/02/2016 11:35 AM

## 2016-10-02 NOTE — H&P (Signed)
Erica Calhoun is an 66 y.o. female.   Chief Complaint: Neck and bilateral shoulder and arm pain right greater than left HPI: 66 year old female with long-standing neck pain bilateral shoulder and arm pain with radiation to her thumb and the first fingers of both hands worse on the right workup has revealed severe severe cervical spondylosis and stenosis C4-5, C5-6, C6-7. And due to patient's failure conservative treatment imaging findings and progressive clinical syndrome I recommended anterior cervical discectomies and fusion at those levels. I extensively reviewed the risks and benefits of the operation the patient as well as perioperative course expectations of outcome and alternatives of surgery and she understood and agreed to proceed forward.  Past Medical History:  Diagnosis Date  . Anginal pain ([place])    cp wkup echo 09/30/15 hp in epic  . Arthritis   . Dyslipidemia   . Heart murmur   . History of recurrent UTIs   . Hypertension   . Hypothyroidism   . Pneumonia    hx/1/17    Past Surgical History:  Procedure Laterality Date  . APPENDECTOMY  1978  . BUNIONECTOMY Bilateral   . TUBAL LIGATION    . w[place] teeth      History reviewed. No pertinent family history. Social History:  reports that she has never smoked. She has never used smokeless tobacco. She reports that she does not drink alcohol or use drugs.  Allergies:  Allergies  Allergen Reactions  . Penicillins Shortness Of Breath, Swelling and Rash    Has patient had a PCN reaction causing immediate rash, facial/tongue/throat swelling, SOB or lightheadedness with hypotension:Yes Has patient had a PCN reaction causing severe rash involving mucus membranes or skin necrosis: No Has patient had a PCN reaction that required hospitalization No Has patient had a PCN reaction occurring within the last 10 years: No If all of the above answers are "NO", then may proceed with Cephalosporin use.   . Adhesive [Tape]     Blisters  skin  MUST USE PAPER TAPE  . Aspirin Other (See Comments)    Ears rings   . Codeine Other (See Comments)    headache  . Morphine Other (See Comments)    headache    Medications Prior to Admission  Medication Sig Dispense Refill  . aspirin EC 81 MG tablet Take 81 mg by mouth daily.    . carboxymethylcellulose (REFRESH PLUS) 0.5 % SOLN Apply 1-2 drops to eye 3 (three) times daily as needed (for dry eyes).    Marland Kitchen Cod Liver Oil 1000 MG CAPS Take 1,000 mg by mouth 2 (two) times daily.    . Glucosamine-Chondroit-Vit C-Mn (GLUCOSAMINE CHONDR 1500 COMPLX) CAPS Take 1 capsule by mouth 2 (two) times daily.    Marland Kitchen ibuprofen (ADVIL,MOTRIN) 200 MG tablet Take 400 mg by mouth daily.    Marland Kitchen levothyroxine (SYNTHROID, LEVOTHROID) 88 MCG tablet Take 88 mcg by mouth daily before breakfast.  12  . losartan-hydrochlorothiazide (HYZAAR) 100-12.5 MG tablet Take 1 tablet by mouth every evening.  3  . metoprolol succinate (TOPROL-XL) 25 MG 24 hr tablet Take 25 mg by mouth daily.    . Multiple Vitamin (MULTIVITAMIN WITH MINERALS) TABS tablet Take 1 tablet by mouth daily.    Marland Kitchen OVER THE COUNTER MEDICATION THYROID ENERGY (vitamin b-6/folate/vitamin b-12/iodine/zinc/selenium/copper)    . traMADol (ULTRAM) 50 MG tablet Take 50 mg by mouth 3 (three) times daily as needed for pain.    Marland Kitchen triamcinolone cream (KENALOG) 0.1 % Apply 1 application topically daily.  No results found for this or any previous visit (from the past 48 hour(s)). No results found.  Review of Systems  Musculoskeletal: Positive for joint pain, myalgias and neck pain.    Blood pressure (!) 146/57, pulse 65, temperature 98 F (36.7 C), temperature source Oral, resp. rate 18, height 5\' 3"  (1.6 m), weight 85.7 kg (189 lb), SpO2 97 %. Physical Exam  Constitutional: She is oriented to person, place, and time. She appears well-developed and well-nourished.  HENT:  Head: Normocephalic.  Eyes: Pupils are equal, round, and reactive to light.  Neck:  Normal range of motion.  Cardiovascular: Normal rate.   Respiratory: Effort normal.  GI: Soft.  Musculoskeletal: Normal range of motion.  Neurological: She is oriented to person, place, and time. She has normal strength. GCS eye subscore is 4. GCS verbal subscore is 5. GCS motor subscore is 6.  She has right-sided chronic tricep weakness at 4+ out of 5 otherwise 5 out of 5 deltoid, bicep, tricep, wrist flexion, wrist extension, hand intrinsics.  Skin: Skin is warm and dry.     Assessment/Plan 66 year old female presents for an ACDF C4-C7  Damarri Rampy P, MD 10/02/2016, 1:49 PM

## 2016-10-02 NOTE — Progress Notes (Signed)
Assessment completed at 1340

## 2016-10-02 NOTE — Anesthesia Preprocedure Evaluation (Signed)
Anesthesia Evaluation  Patient identified by MRN, date of birth, ID band Patient awake    Reviewed: Allergy & Precautions, NPO status , Patient's Chart, lab work & pertinent test results  Airway Mallampati: II       Dental no notable dental hx.    Pulmonary    Pulmonary exam normal breath sounds clear to auscultation       Cardiovascular hypertension, Pt. on medications and Pt. on home beta blockers Normal cardiovascular exam Rhythm:Regular Rate:Normal     Neuro/Psych negative neurological ROS  negative psych ROS   GI/Hepatic negative GI ROS, Neg liver ROS,   Endo/Other  Hypothyroidism   Renal/GU negative Renal ROS  negative genitourinary   Musculoskeletal   Abdominal (+) + obese,   Peds negative pediatric ROS (+)  Hematology negative hematology ROS (+)   Anesthesia Other Findings preoperative note from 09/26/16. Patient was scheduled for C4-5, C5-6, C6-7 ACDF on 09/27/2016 by Dr. Saintclair Halsted. Surgery was postponed to allow for either cardiology evaluation and/or a stress test. Patient was seen by cardiologist Dr. Wynonia Lawman on 09/28/16. He wrote, "At this point I think she is stable to undergo the planned surgery. EKG today personally reviewed by me shows an lV conduction delay, there is a QS pattern in lead 3 but she has a normal echocardiogram at the same time that this was done so I don't really see any change in her EKG or anything that I think needs further testing. I would be interested to see if her symptoms persist or improve after her cervical disc surgery. She should continue her antihypertensive medicines during the time of surgery. She may discontinue aspirin or other anticoagulants if needed in preparation for surgery."  George Hugh [facility] Short Stay Center/Anesthesiology Phone [phone] 10/02/2016 11:35 AM  Reproductive/Obstetrics negative OB ROS                              Anesthesia Physical Anesthesia Plan  ASA: II  Anesthesia Plan: General   Post-op Pain Management:    Induction: Intravenous  Airway Management Planned: Oral ETT  Additional Equipment:   Intra-op Plan:   Post-operative Plan: Extubation in OR  Informed Consent: I have reviewed the patients History and Physical, chart, labs and discussed the procedure including the risks, benefits and alternatives for the proposed anesthesia with the patient or authorized representative who has indicated his/her understanding and acceptance.     Plan Discussed with: CRNA and Surgeon  Anesthesia Plan Comments:         Anesthesia Quick Evaluation

## 2016-10-02 NOTE — Progress Notes (Signed)
Pharmacy Antibiotic Note  Erica Calhoun is a 66 y.o. female admitted on 10/02/2016 with Anterior cervical discectomies and fusion at C4-5 C5-6 C6-7 utilizing allograft structural wedges and the [employer] Atlantis translational plating system.  Pharmacy has been consulted for vancomycin dosing for surgical prophylaxis while pt has drain in place. Wt 85.7 kg. AF. Vanc 1 gm given preop at 1436  Bacitracin irrigation given at 1544. Creat 0.5. MRSA PCR neg, SA positive. PCN allergy SOB, swelling, rash.   Plan: Vancomycin 1000 mg IV every 12 hours.  Goal trough 10-15 mcg/mL.  No levels planned as vancomycin to be stopped when drain out.  Height: 5\' 3"  (160 cm) Weight: 189 lb (85.7 kg) IBW/kg (Calculated) : 52.4  Temp (24hrs), Avg:97.5 F (36.4 C), Min:97 F (36.1 C), Max:98 F (36.7 C)   Recent Labs Lab 09/26/16 1032  WBC 5.4  CREATININE 0.50    Estimated Creatinine Clearance: 72.7 mL/min (by C-G formula based on SCr of 0.5 mg/dL).    Allergies  Allergen Reactions  . Penicillins Shortness Of Breath, Swelling and Rash    Has patient had a PCN reaction causing immediate rash, facial/tongue/throat swelling, SOB or lightheadedness with hypotension:Yes Has patient had a PCN reaction causing severe rash involving mucus membranes or skin necrosis: No Has patient had a PCN reaction that required hospitalization No Has patient had a PCN reaction occurring within the last 10 years: No If all of the above answers are "NO", then may proceed with Cephalosporin use.   . Adhesive [Tape]     Blisters skin  MUST USE PAPER TAPE  . Aspirin Other (See Comments)    Ears rings   . Codeine Other (See Comments)    headache  . Morphine Other (See Comments)    headache    Eudelia Bunch, Pharm.D. [id number] 10/02/2016 7:12 PM

## 2016-10-02 NOTE — Anesthesia Postprocedure Evaluation (Addendum)
Anesthesia Post Note  Patient: Erica Calhoun  Procedure(s) Performed: Procedure(s) (LRB): Anterior Cervical Diskectomy and Fusion - Cervical four- Cervical five - Cervical five-Cervical six - Cervical six -Cervical seven (N/A)  Patient location during evaluation: PACU Anesthesia Type: General Level of consciousness: awake and sedated Pain management: pain level controlled Vital Signs Assessment: post-procedure vital signs reviewed and stable Cardiovascular status: stable Postop Assessment: no signs of nausea or vomiting Anesthetic complications: no        Last Vitals:  Vitals:   10/02/16 1755 10/02/16 1810  BP: 139/84 (!) 144/64  Pulse: 69 69  Resp: 14 12  Temp:      Last Pain:  Vitals:   10/02/16 1802  TempSrc:   PainSc: 5    Pain Goal: Patients Stated Pain Goal: 8 (10/02/16 1134)               [place]

## 2016-10-02 NOTE — Op Note (Signed)
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  Postoperative diagnosis: Same  Procedure: Anterior cervical discectomies and fusion at C4-5 C5-6 C6-7 utilizing allograft structural wedges and the [employer] Atlantis translational plating system  Surgeon: Dominica Severin Kiernan Atkerson  Asst.: Jonni Sanger pool  Anesthesia: Gen.  EBL: Minimal  History of present illness: Patient is a very pleasant 66 year old female is a long-standing neck pain bilateral shoulder and arm pain worse in the right with radiation down the C7 and C6 nerve root pattern. Workup revealed severe spondylosis at C4-5 and C5-6 as well as spondylosis and a disc herniation at C6-7. Due to patient's failure conservative treatment imaging findings and progression of clinical syndrome I recommended anterior cervical discectomies and fusion at those levels. I extensively reviewed the risks and benefits of the operation with the patient as well as perioperative course expectations of outcome and alternatives of surgery and she understood and agreed to proceed forward.  Operative procedure: Patient brought into the or was induced under general anesthesia positioned supine the neck in slight extension in 5 pounds of halter traction the right-sided neck was prepped and draped in routine sterile fashion preoperative x-ray localize the appropriate level so a curvilinear into his incision was made just off midline to the anteversion, mastoid superficially platysmas dissected and divided longitudinally the avascular plane to sternomastoid and strap muscles was developed down to the prevertebral fascia and this was dissected away with Kitners. Interoperative x-ray confirmed the location appropriate level so than the longus close reflected laterally and self-retaining retractors were placed. Large anterior aspect of did not Leksell rongeur and a 3 mm Kerrison punch disc spaces were incised and all 3 disc space were  drilled down the posterior annulus and posterior complex. First working at C6-7 under microscopic illumination further drilling down the disc space allowed indication of posterior annulus and posterior lodged sling. There wasn't any kind annular rent and defect in the ligament with a large free fragment that migrated subligamentous causing severe thecal sac and spinal cord compression. This was teased with a nerve hook and pituitary rongeurs. Continued removal of posterior longitudinal limb and allow me to remove several other large fragments of disc the dura was markedly densely adherent and scarred to the chronic inflammatory process. However I freed all this up removed PLL aggressively under bitten both vertebral bodies marched laterally both C7 pedicles were identified and both C7 nerve roots were skeletonized flush with pedicle I disarticulated the uncinate process on the right ensure adequate decompression of the right C7 nerve root with a disc fragment was. Then identified at C5-6 and did a similar fashion drilled down the disc space aggressively under bitten both endplates and both C6 nerve roots were identified and skeletonized flush with pedicle. After adequate decompression been achieved at all 3 disc spaces and C4-5 and also then decompressed with removing large spur off the C4 vertebral body and removing soft disc herniation and adequate foraminal decompression also been achieved endplates were prepared to receive the allografts. 6 mm allografts were inserted at C4-5 and C5-6 8 mm C6-7 and a 52-1/2 Atlantis translational plate was placed. All screws excellent purchase locking mechanism was engaged the wounds and copiously irrigated meticulous hemostasis was maintained I placed a JP drain and closed with layers with interrupted Vicryl and a running 4 subcuticular Dermabond benzo and Steri-Strips and sterile dressings applied patient recovered in stable condition. At the end the case all needle counts  sponge counts were correct.

## 2016-10-03 ENCOUNTER — Encounter (HOSPITAL_COMMUNITY): Payer: Self-pay | Admitting: Neurosurgery

## 2016-10-03 MED ORDER — CYCLOBENZAPRINE HCL 10 MG PO TABS: 10.0000 mg | ORAL_TABLET | Freq: Three times a day (TID) | ORAL | 0 refills | Status: DC | PRN

## 2016-10-03 MED FILL — Thrombin For Soln 5000 Unit: CUTANEOUS | Qty: 5000 | Status: AC

## 2016-10-03 NOTE — Progress Notes (Signed)
Pt doing well. Pt and husband given D/C instructions with verbal understanding. Pt's incision is clean and dry with no sign of infection. Pt's IV and JP drain were removed prior to D/C. Pt D/C'd home via wheelchair @ 1040 per MD order. Pt is stable @ D/C and has no other needs at this time. Holli Humbles, RN

## 2016-10-03 NOTE — Discharge Summary (Signed)
Physician Discharge Summary  Patient ID: Erica Calhoun MRN: [id number] DOB/AGE: Nov 11, 1950 66 y.o.  Admit date: 10/02/2016 Discharge date: 10/03/2016  Admission Diagnoses:Cervical spondylosis and HNP C4-5 C5-6 C6-7  Discharge Diagnoses: Same Active Problems:   HNP (herniated nucleus pulposus), cervical   Discharged Condition: good  Hospital Course: She is also underwent ACDF C4-5 C5-6 C6-7 postoperatively patient did very well recovered in the floor on the floor was angling and voiding spontaneous he tolerating regular diet stable for discharge home.  Consults: Significant Diagnostic Studies: Treatments: ACDF C4-C7 Discharge Exam: Blood pressure 129/73, pulse 65, temperature 97.7 F (36.5 C), temperature source Oral, resp. rate 18, height 5\' 3"  (1.6 m), weight 85.7 kg (189 lb), SpO2 97 %. Strength out of 5 wound clean dry and intact  Disposition: Home   Allergies as of 10/03/2016      Reactions   Penicillins Shortness Of Breath, Swelling, Rash   Has patient had a PCN reaction causing immediate rash, facial/tongue/throat swelling, SOB or lightheadedness with hypotension:Yes Has patient had a PCN reaction causing severe rash involving mucus membranes or skin necrosis: No Has patient had a PCN reaction that required hospitalization No Has patient had a PCN reaction occurring within the last 10 years: No If all of the above answers are "NO", then may proceed with Cephalosporin use.   Adhesive [tape]    Blisters skin  MUST USE PAPER TAPE   Aspirin Other (See Comments)   Ears rings   Codeine Other (See Comments)   headache   Morphine Other (See Comments)   headache      Medication List    TAKE these medications   aspirin EC 81 MG tablet Take 81 mg by mouth daily.   carboxymethylcellulose 0.5 % Soln Commonly known as:  REFRESH PLUS Apply 1-2 drops to eye 3 (three) times daily as needed (for dry eyes).   Cod Liver Oil 1000 MG Caps Take 1,000 mg by mouth 2 (two)  times daily.   cyclobenzaprine 10 MG tablet Commonly known as:  FLEXERIL Take 1 tablet (10 mg total) by mouth 3 (three) times daily as needed for muscle spasms.   GLUCOSAMINE CHONDR 1500 COMPLX Caps Take 1 capsule by mouth 2 (two) times daily.   ibuprofen 200 MG tablet Commonly known as:  ADVIL,MOTRIN Take 400 mg by mouth daily.   levothyroxine 88 MCG tablet Commonly known as:  SYNTHROID, LEVOTHROID Take 88 mcg by mouth daily before breakfast.   losartan-hydrochlorothiazide 100-12.5 MG tablet Commonly known as:  HYZAAR Take 1 tablet by mouth every evening.   metoprolol succinate 25 MG 24 hr tablet Commonly known as:  TOPROL-XL Take 25 mg by mouth daily.   multivitamin with minerals Tabs tablet Take 1 tablet by mouth daily.   OVER THE COUNTER MEDICATION THYROID ENERGY (vitamin b-6/folate/vitamin b-12/iodine/zinc/selenium/copper)   traMADol 50 MG tablet Commonly known as:  ULTRAM Take 50 mg by mouth 3 (three) times daily as needed for pain.   triamcinolone cream 0.1 % Commonly known as:  KENALOG Apply 1 application topically daily.      Follow-up Information    Brogan England P, MD Follow up in 2 day(s).   Specialty:  Neurosurgery Contact information: [address] [place] [address] [phone]        Elaina Hoops, MD .   Specialty:  Neurosurgery Contact information: [address] [place] [address] [place] [address] [phone]           Signed: Elaina Hoops 10/03/2016, 8:10 AM

## 2016-10-03 NOTE — Discharge Instructions (Signed)
No lifting no bending no twisting no driving a riding a car older to come back and forth and see me. Keep the incision clean dry and intact. May remove the outer dressing in 2-3 days leave the Steri-Strips on and intact.  Wound Care Keep incision covered and dry for 3 days.  If you shower prior to then, cover incision with plastic wrap after dressing comes off.  You may remove outer bandage after 3 days and shower.  Do not put any creams, lotions, or ointments on incision. Leave steri-strips on neck.  They will fall off by themselves. Activity Walk each and every day, increasing distance each day. No lifting greater than 5 lbs.  Avoid excessive neck motion. No driving for 2 weeks; may ride as a passenger locally. Wear neck brace at all times except when showering or otherwise instructed. Diet Resume your normal diet.  Return to Work Will be discussed at you follow up appointment. Call Your Doctor If Any of These Occur Redness, drainage, or swelling at the wound.  Temperature greater than 101 degrees. Severe pain not relieved by pain medication. Increased difficulty swallowing.  Incision starts to come apart. Follow Up Appt Call today for appointment in 1-2 weeks [id number]) or for problems.  If you have any hardware placed in your spine, you will need an x-ray before your appointment.

## 2016-10-30 DIAGNOSIS — R0789 Other chest pain: Secondary | ICD-10-CM | POA: Diagnosis not present

## 2016-10-30 DIAGNOSIS — Z01810 Encounter for preprocedural cardiovascular examination: Secondary | ICD-10-CM | POA: Diagnosis not present

## 2016-10-30 DIAGNOSIS — E668 Other obesity: Secondary | ICD-10-CM | POA: Diagnosis not present

## 2016-10-30 DIAGNOSIS — E785 Hyperlipidemia, unspecified: Secondary | ICD-10-CM | POA: Diagnosis not present

## 2016-10-30 DIAGNOSIS — I10 Essential (primary) hypertension: Secondary | ICD-10-CM | POA: Diagnosis not present

## 2016-10-30 DIAGNOSIS — E039 Hypothyroidism, unspecified: Secondary | ICD-10-CM | POA: Diagnosis not present

## 2016-11-06 DIAGNOSIS — M5137 Other intervertebral disc degeneration, lumbosacral region: Secondary | ICD-10-CM | POA: Diagnosis not present

## 2016-11-06 DIAGNOSIS — M542 Cervicalgia: Secondary | ICD-10-CM | POA: Diagnosis not present

## 2016-11-06 DIAGNOSIS — M5416 Radiculopathy, lumbar region: Secondary | ICD-10-CM | POA: Diagnosis not present

## 2016-11-09 DIAGNOSIS — M5416 Radiculopathy, lumbar region: Secondary | ICD-10-CM | POA: Diagnosis not present

## 2016-11-09 DIAGNOSIS — M545 Low back pain: Secondary | ICD-10-CM | POA: Diagnosis not present

## 2016-11-16 DIAGNOSIS — E668 Other obesity: Secondary | ICD-10-CM | POA: Diagnosis not present

## 2016-11-16 DIAGNOSIS — E039 Hypothyroidism, unspecified: Secondary | ICD-10-CM | POA: Diagnosis not present

## 2016-11-16 DIAGNOSIS — R0789 Other chest pain: Secondary | ICD-10-CM | POA: Diagnosis not present

## 2016-11-16 DIAGNOSIS — I10 Essential (primary) hypertension: Secondary | ICD-10-CM | POA: Diagnosis not present

## 2016-11-16 DIAGNOSIS — E785 Hyperlipidemia, unspecified: Secondary | ICD-10-CM | POA: Diagnosis not present

## 2016-11-21 ENCOUNTER — Encounter: Payer: Self-pay | Admitting: Cardiology

## 2016-11-21 DIAGNOSIS — I10 Essential (primary) hypertension: Secondary | ICD-10-CM | POA: Diagnosis not present

## 2016-11-21 DIAGNOSIS — E668 Other obesity: Secondary | ICD-10-CM | POA: Diagnosis not present

## 2016-11-21 DIAGNOSIS — E039 Hypothyroidism, unspecified: Secondary | ICD-10-CM | POA: Diagnosis not present

## 2016-11-21 DIAGNOSIS — R0789 Other chest pain: Secondary | ICD-10-CM | POA: Diagnosis not present

## 2016-11-21 DIAGNOSIS — E785 Hyperlipidemia, unspecified: Secondary | ICD-10-CM | POA: Diagnosis not present

## 2016-11-21 NOTE — H&P (Addendum)
Erica Calhoun    Date of visit:  11/21/2016 DOB:  1951/05/10    Age:  66 yrs. Medical record number:  [id number]     Account number:  [id number] Primary Care Provider: Wende Neighbors ____________________________ CURRENT DIAGNOSES  1. Chest pain  2. Hyperlipidemia  3. Hypertensive heart disease without heart failure  4. Aortic valve stenosis  5. Hypothyroidism  6. Obesity ____________________________ ALLERGIES  Penicillins, Rash ____________________________ MEDICATIONS  1. tramadol 50 mg tablet, 1 p.o. daily  2. losartan 100 mg-hydrochlorothiazide 12.5 mg tablet, 1 p.o. daily  3. metoprolol succinate ER 25 mg tablet,extended release 24 hr, 1 p.o. daily  4. levothyroxine 88 mcg tablet, 1 p.o. daily  5. triamcinolone acetonide 0.1 % topical cream, PRN ____________________________ CHIEF COMPLAINTS  discuss abnormal stress test ____________________________ HISTORY OF PRESENT ILLNESS Patient returns for cardiac followup. She was originally seen for preoperative evaluation for cervical disc surgery and was having good deal of chest pain at that time. She thought her chest pain was due to the cervical disc issues and she went ahead and tolerated the surgery well but has continued to have pressure-like feelings in her chest. She returned afterwards and had a myocardial perfusion scan on March 15 that showed a moderate reversible lateral wall defect consistent with ischemia. She also had jaw pain with Lexiscan infusion. She is fairly limited in what she can do because of low back pain. She has hyperlipidemia that she will not take statins for and in addition has a strong family history of heart disease as well as hypertension. A previous echo has shown moderate asymmetric septal hypertrophy with normal global wall motion and a mildly thickened aortic valve with mild aortic stenosis and mild-to-moderate aortic regurgitation. She was seen back today to further discuss her ongoing chest pain and potential  surgical evaluation for lumbar back surgery. ____________________________ PAST HISTORY  Past Medical Illnesses:  hypertension, hyperlipidemia, hypothyroidism, surgery on rt ureter, obesity;  Cardiovascular Illnesses:  aortic stenosis;  Infectious Diseases:  no previous history of significant infectious diseases;  Surgical Procedures:  appendectomy, tubal ligation, bunion surgery, surgery on right ureter, cervical laminectomy;  Trauma History:  no previous history of significant trauma;  NYHA Classification:  I;  Canadian Angina Classification:  Class 0: Asymptomatic;  Cardiology Procedures-Invasive:  no previous interventional or invasive cardiology procedures;  Cardiology Procedures-Noninvasive:  echocardiogram January 2017, echocardiogram March 2018, lexiscan Myoview March 2018;  Peripheral Vascular Procedures:  no previous invasive peripheral vascular procedures.;  LVEF of 65% documented via nuclear study on 11/16/2016,   ____________________________ CARDIO-PULMONARY TEST DATES EKG Date:  09/28/2016;   Cardiac Cath Date:  11/16/2016;  Echocardiography Date: 10/30/2016;   ____________________________ FAMILY HISTORY Brother -- Brother alive with problem, Hypertension Father -- Coronary Artery Disease, Father dead Mother -- Coronary Artery Disease, Mother dead Sister -- Sister alive and well Sister -- Sister alive with problem, Carcinoma of breast Sister -- Sister dead ____________________________ SOCIAL HISTORY Alcohol Use:  does not use alcohol;  Smoking:  never smoked;  Diet:  regular diet;  Lifestyle:  married;  Exercise:  no regular exercise;  Residence:  lives with husband;   ____________________________ REVIEW OF SYSTEMS General:  obesity  Integumentary:rash on right arm seen by dermatologist and treated with triamcinalone cream Eyes: wears eye glasses/contact lenses Respiratory: denies dyspnea, cough, wheezing or hemoptysis. Cardiovascular:  please review HPI Abdominal: denies dyspepsia,  GI bleeding, constipation, or diarrheaGenitourinary-Female: no dysuria, urgency, frequency, UTIs, or stress incontinence Musculoskeletal:  arthritis of the knees, chronic low back  pain Neurological:  headaches  ____________________________ PHYSICAL EXAMINATION VITAL SIGNS  Blood Pressure:  150/90 Sitting, Left arm, large cuff  , 150/80 Standing, Left arm and large cuff   Pulse:  72/min. Weight:  181.00 lbs. Height:  63"BMI: 32  Constitutional:  pleasant white female, in no acute distress, mildly obese Skin:  warm and dry to touch, no apparent skin lesions, or masses noted. Head:  normocephalic, normal hair pattern, no masses or tenderness Neck:  healed surgical scar anterior neck, no JVD, no bruits, no masses Chest:  normal symmetry, clear to auscultation. Cardiac:  regular rhythm, normal S1 and S2, no S3 or S4, grade 2/6 systolic murmur at aortic area radiating to neck Abdomen:  abdomen soft,non-tender, no masses, no hepatospenomegaly, or aneurysm noted Peripheral Pulses:  the femoral,dorsalis pedis, and posterior tibial pulses are full and equal bilaterally with no bruits auscultated. Extremities & Back:  no deformities, clubbing, cyanosis, erythema or edema observed. Normal muscle strength and tone. Neurological:  no gross motor or sensory deficits noted, affect appropriate, oriented x3. ___________________________ IMPRESSIONS/PLAN  1. Ongoing chest discomfort with some atypical features with multiple risk factors 2. Abnormal myocardial perfusion scan with lateral wall ischemia 3. Untreated hyperlipidemia 4. Mild aortic valve disease 5. Hypertension 6. Family history of cardiac disease 7. Significant lumbar disc disease  Recommendations:  I've recommended cardiac catheterization to the patient. Her husband was with her today and we discussed this extensively. Cardiac catheterization was discussed with the patient including risks of myocardial infarction, death, stroke, bleeding,  arrhythmia, dye allergy, or renal insufficiency. She understands and is willing to proceed. Possibility of percutaneous intervention at the same setting was also discussed with the patient including risks.  She understands that she will need to take dual antiplatelet therapy if she does have stenting and this may postpone any lumbar surgery. Her questions were fully answered today. ____________________________ TODAYS ORDERS  1. Comprehensive Metabolic Panel: Today  2. Complete Blood Count: Today  3. Draw PT/INR: Today  4. PTT: Today                       ____________________________ Cardiology Physician:  Kerry Hough MD [facility]

## 2016-11-28 ENCOUNTER — Encounter (HOSPITAL_COMMUNITY)
Admission: RE | Disposition: A | Discharge status: Home / Self Care | Payer: Self-pay | Source: Ambulatory Visit | Attending: Cardiovascular Disease

## 2016-11-28 ENCOUNTER — Encounter (HOSPITAL_COMMUNITY): Payer: Self-pay | Admitting: Cardiovascular Disease

## 2016-11-28 ENCOUNTER — Ambulatory Visit (HOSPITAL_COMMUNITY)
Admission: RE | Admit: 2016-11-28 | Discharge: 2016-11-28 | Disposition: A | Discharge status: Home / Self Care | Payer: Medicare Other | Source: Ambulatory Visit | Attending: Cardiovascular Disease | Admitting: Cardiovascular Disease

## 2016-11-28 DIAGNOSIS — E785 Hyperlipidemia, unspecified: Secondary | ICD-10-CM | POA: Insufficient documentation

## 2016-11-28 DIAGNOSIS — I25110 Atherosclerotic heart disease of native coronary artery with unstable angina pectoris: Secondary | ICD-10-CM | POA: Diagnosis not present

## 2016-11-28 DIAGNOSIS — Z880 Allergy status to penicillin: Secondary | ICD-10-CM | POA: Diagnosis not present

## 2016-11-28 DIAGNOSIS — E039 Hypothyroidism, unspecified: Secondary | ICD-10-CM | POA: Diagnosis not present

## 2016-11-28 DIAGNOSIS — G8929 Other chronic pain: Secondary | ICD-10-CM | POA: Insufficient documentation

## 2016-11-28 DIAGNOSIS — R9439 Abnormal result of other cardiovascular function study: Secondary | ICD-10-CM | POA: Diagnosis not present

## 2016-11-28 DIAGNOSIS — Z6832 Body mass index (BMI) 32.0-32.9, adult: Secondary | ICD-10-CM | POA: Insufficient documentation

## 2016-11-28 DIAGNOSIS — M519 Unspecified thoracic, thoracolumbar and lumbosacral intervertebral disc disorder: Secondary | ICD-10-CM | POA: Diagnosis not present

## 2016-11-28 DIAGNOSIS — I350 Nonrheumatic aortic (valve) stenosis: Secondary | ICD-10-CM | POA: Insufficient documentation

## 2016-11-28 DIAGNOSIS — E669 Obesity, unspecified: Secondary | ICD-10-CM | POA: Insufficient documentation

## 2016-11-28 DIAGNOSIS — Z8249 Family history of ischemic heart disease and other diseases of the circulatory system: Secondary | ICD-10-CM | POA: Insufficient documentation

## 2016-11-28 DIAGNOSIS — I119 Hypertensive heart disease without heart failure: Secondary | ICD-10-CM | POA: Insufficient documentation

## 2016-11-28 HISTORY — PX: LEFT HEART CATH AND CORONARY ANGIOGRAPHY: CATH118249

## 2016-11-28 SURGERY — LEFT HEART CATH AND CORONARY ANGIOGRAPHY
Anesthesia: LOCAL

## 2016-11-28 MED ORDER — SODIUM CHLORIDE 0.9 % WEIGHT BASED INFUSION: 1.0000 mL/kg/h | INTRAVENOUS | Status: DC

## 2016-11-28 MED ORDER — VERAPAMIL HCL 2.5 MG/ML IV SOLN
INTRAVENOUS | Status: DC | PRN
  Administered 2016-11-28: 10 mL via INTRA_ARTERIAL

## 2016-11-28 MED ORDER — SODIUM CHLORIDE 0.9 % IV SOLN: 250.0000 mL | INTRAVENOUS | Status: DC | PRN

## 2016-11-28 MED ORDER — ONDANSETRON HCL 4 MG/2ML IJ SOLN: 4.0000 mg | Freq: Four times a day (QID) | INTRAMUSCULAR | Status: DC | PRN

## 2016-11-28 MED ORDER — ACETAMINOPHEN 325 MG PO TABS: 650.0000 mg | ORAL_TABLET | ORAL | Status: DC | PRN

## 2016-11-28 MED ORDER — ASPIRIN 81 MG PO CHEW
81.0000 mg | CHEWABLE_TABLET | ORAL | Status: AC
  Administered 2016-11-28: 81 mg via ORAL

## 2016-11-28 MED ORDER — SODIUM CHLORIDE 0.9% FLUSH: 3.0000 mL | INTRAVENOUS | Status: DC | PRN

## 2016-11-28 MED ORDER — SODIUM CHLORIDE 0.9 % WEIGHT BASED INFUSION
3.0000 mL/kg/h | INTRAVENOUS | Status: AC
  Administered 2016-11-28: 3 mL/kg/h via INTRAVENOUS

## 2016-11-28 MED ORDER — SODIUM CHLORIDE 0.9% FLUSH: 3.0000 mL | Freq: Two times a day (BID) | INTRAVENOUS | Status: DC

## 2016-11-28 MED ORDER — MIDAZOLAM HCL 2 MG/2ML IJ SOLN
INTRAMUSCULAR | Status: AC
  Filled 2016-11-28: qty 2

## 2016-11-28 MED ORDER — FENTANYL CITRATE (PF) 100 MCG/2ML IJ SOLN
INTRAMUSCULAR | Status: AC
  Filled 2016-11-28: qty 2

## 2016-11-28 MED ORDER — IOPAMIDOL (ISOVUE-370) INJECTION 76%
INTRAVENOUS | Status: DC | PRN
  Administered 2016-11-28: 65 mL via INTRA_ARTERIAL

## 2016-11-28 MED ORDER — HEPARIN SODIUM (PORCINE) 1000 UNIT/ML IJ SOLN
INTRAMUSCULAR | Status: DC | PRN
  Administered 2016-11-28: 4000 [IU] via INTRAVENOUS

## 2016-11-28 MED ORDER — HEPARIN (PORCINE) IN NACL 2-0.9 UNIT/ML-% IJ SOLN
INTRAMUSCULAR | Status: AC
  Filled 2016-11-28: qty 1000

## 2016-11-28 MED ORDER — LIDOCAINE HCL (PF) 1 % IJ SOLN
INTRAMUSCULAR | Status: DC | PRN
  Administered 2016-11-28: 2 mL

## 2016-11-28 MED ORDER — VERAPAMIL HCL 2.5 MG/ML IV SOLN
INTRAVENOUS | Status: AC
  Filled 2016-11-28: qty 2

## 2016-11-28 MED ORDER — SODIUM CHLORIDE 0.9 % IV SOLN: INTRAVENOUS | Status: DC

## 2016-11-28 MED ORDER — ASPIRIN 81 MG PO CHEW
CHEWABLE_TABLET | ORAL | Status: AC
  Filled 2016-11-28: qty 1

## 2016-11-28 MED ORDER — FENTANYL CITRATE (PF) 100 MCG/2ML IJ SOLN
INTRAMUSCULAR | Status: DC | PRN
  Administered 2016-11-28 (×2): 25 ug via INTRAVENOUS

## 2016-11-28 MED ORDER — HEPARIN SODIUM (PORCINE) 1000 UNIT/ML IJ SOLN
INTRAMUSCULAR | Status: AC
  Filled 2016-11-28: qty 1

## 2016-11-28 MED ORDER — DIAZEPAM 5 MG PO TABS: 5.0000 mg | ORAL_TABLET | Freq: Four times a day (QID) | ORAL | Status: DC | PRN

## 2016-11-28 MED ORDER — HEPARIN (PORCINE) IN NACL 2-0.9 UNIT/ML-% IJ SOLN
INTRAMUSCULAR | Status: DC | PRN
  Administered 2016-11-28: 1000 mL

## 2016-11-28 MED ORDER — IOPAMIDOL (ISOVUE-370) INJECTION 76%
INTRAVENOUS | Status: AC
  Filled 2016-11-28: qty 100

## 2016-11-28 MED ORDER — LIDOCAINE HCL (PF) 1 % IJ SOLN
INTRAMUSCULAR | Status: AC
  Filled 2016-11-28: qty 30

## 2016-11-28 MED ORDER — MIDAZOLAM HCL 2 MG/2ML IJ SOLN
INTRAMUSCULAR | Status: DC | PRN
  Administered 2016-11-28: 1 mg via INTRAVENOUS
  Administered 2016-11-28: 2 mg via INTRAVENOUS

## 2016-11-28 SURGICAL SUPPLY — 12 items
CATH INFINITI 5FR ANG PIGTAIL (CATHETERS) ×1 IMPLANT
CATH INFINITI JR4 5F (CATHETERS) ×1 IMPLANT
CATH OPTITORQUE TIG 4.0 5F (CATHETERS) ×1 IMPLANT
DEVICE RAD COMP TR BAND LRG (VASCULAR PRODUCTS) ×1 IMPLANT
GLIDESHEATH SLEND SS 6F .021 (SHEATH) ×1 IMPLANT
GUIDEWIRE INQWIRE 1.5J.035X260 (WIRE) IMPLANT
INQWIRE 1.5J .035X260CM (WIRE) ×2
KIT HEART LEFT (KITS) ×2 IMPLANT
PACK CARDIAC CATHETERIZATION (CUSTOM PROCEDURE TRAY) ×2 IMPLANT
SYR MEDRAD MARK V 150ML (SYRINGE) ×2 IMPLANT
TRANSDUCER W/STOPCOCK (MISCELLANEOUS) ×2 IMPLANT
TUBING CIL FLEX 10 FLL-RA (TUBING) ×2 IMPLANT

## 2016-11-28 NOTE — Discharge Instructions (Signed)
Radial Site Care °Refer to this sheet in the next few weeks. These instructions provide you with information about caring for yourself after your procedure. Your health care provider may also give you more specific instructions. Your treatment has been planned according to current medical practices, but problems sometimes occur. Call your health care provider if you have any problems or questions after your procedure. °What can I expect after the procedure? °After your procedure, it is typical to have the following: °· Bruising at the radial site that usually fades within 1-2 weeks. °· Blood collecting in the tissue (hematoma) that may be painful to the touch. It should usually decrease in size and tenderness within 1-2 weeks. °Follow these instructions at home: °· Take medicines only as directed by your health care provider. °· You may shower 24-48 hours after the procedure or as directed by your health care provider. Remove the bandage (dressing) and gently wash the site with plain soap and water. Pat the area dry with a clean towel. Do not rub the site, because this may cause bleeding. °· Do not take baths, swim, or use a hot tub until your health care provider approves. °· Check your insertion site every day for redness, swelling, or drainage. °· Do not apply powder or lotion to the site. °· Do not flex or bend the affected arm for 24 hours or as directed by your health care provider. °· Do not push or pull heavy objects with the affected arm for 24 hours or as directed by your health care provider. °· Do not lift over 10 lb (4.5 kg) for 5 days after your procedure or as directed by your health care provider. °· Ask your health care provider when it is okay to: °¨ Return to work or school. °¨ Resume usual physical activities or sports. °¨ Resume sexual activity. °· Do not drive home if you are discharged the same day as the procedure. Have someone else drive you. °· You may drive 24 hours after the procedure  unless otherwise instructed by your health care provider. °· Do not operate machinery or power tools for 24 hours after the procedure. °· If your procedure was done as an outpatient procedure, which means that you went home the same day as your procedure, a responsible adult should be with you for the first 24 hours after you arrive home. °· Keep all follow-up visits as directed by your health care provider. This is important. °Contact a health care provider if: °· You have a fever. °· You have chills. °· You have increased bleeding from the radial site. Hold pressure on the site. °Get help right away if: °· You have unusual pain at the radial site. °· You have redness, warmth, or swelling at the radial site. °· You have drainage (other than a small amount of blood on the dressing) from the radial site. °· The radial site is bleeding, and the bleeding does not stop after 30 minutes of holding steady pressure on the site. °· Your arm or hand becomes pale, cool, tingly, or numb. °This information is not intended to replace advice given to you by your health care provider. Make sure you discuss any questions you have with your health care provider. °Document Released: 09/23/2010 Document Revised: 01/27/2016 Document Reviewed: 03/09/2014 °[employer] Interactive Patient Education © 2017 [employer] ° °

## 2016-11-28 NOTE — H&P (View-Only) (Signed)
Erica Calhoun    Date of visit:  11/21/2016 DOB:  1951-05-28    Age:  66 yrs. Medical record number:  [id number]     Account number:  [id number] Primary Care Provider: Wende Neighbors ____________________________ CURRENT DIAGNOSES  1. Chest pain  2. Hyperlipidemia  3. Hypertensive heart disease without heart failure  4. Aortic valve stenosis  5. Hypothyroidism  6. Obesity ____________________________ ALLERGIES  Penicillins, Rash ____________________________ MEDICATIONS  1. tramadol 50 mg tablet, 1 p.o. daily  2. losartan 100 mg-hydrochlorothiazide 12.5 mg tablet, 1 p.o. daily  3. metoprolol succinate ER 25 mg tablet,extended release 24 hr, 1 p.o. daily  4. levothyroxine 88 mcg tablet, 1 p.o. daily  5. triamcinolone acetonide 0.1 % topical cream, PRN ____________________________ CHIEF COMPLAINTS  discuss abnormal stress test ____________________________ HISTORY OF PRESENT ILLNESS Patient returns for cardiac followup. She was originally seen for preoperative evaluation for cervical disc surgery over 71 good deal of chest pain at that time. She thought her chest pain was due to the cervical disc issues and she went ahead and tolerated the surgery well but has continued to have pressure-like feelings in her chest. She returned after her words and had a myocardial perfusion scan on March 15 that showed a moderate reversible lateral wall defect consistent with ischemia. She also had jaw pain with Lexis scan infusion. She is fairly limited in what she can do because of low back pain. She has hyperlipidemia that she will take statins for and in addition has a strong family history of heart disease as well as hypertension. A previous echo has shown moderate asymmetric septal hypertrophy with normal global wall motion and a mildly thickened aortic valve with mild aortic stenosis and mild-to-moderate were regurgitation. She was seen back today to further discuss her ongoing chest pain and potential surgical  evaluation for lumbar back surgery. ____________________________ PAST HISTORY  Past Medical Illnesses:  hypertension, hyperlipidemia, hypothyroidism, surgery on rt ureter, obesity;  Cardiovascular Illnesses:  aortic stenosis;  Infectious Diseases:  no previous history of significant infectious diseases;  Surgical Procedures:  appendectomy, tubal ligation, bunion surgery, surgery on right ureter, cervical laminectomy;  Trauma History:  no previous history of significant trauma;  NYHA Classification:  I;  Canadian Angina Classification:  Class 0: Asymptomatic;  Cardiology Procedures-Invasive:  no previous interventional or invasive cardiology procedures;  Cardiology Procedures-Noninvasive:  echocardiogram January 2017, echocardiogram March 2018, lexiscan Myoview March 2018;  Peripheral Vascular Procedures:  no previous invasive peripheral vascular procedures.;  LVEF of 65% documented via nuclear study on 11/16/2016,   ____________________________ CARDIO-PULMONARY TEST DATES EKG Date:  09/28/2016;   Cardiac Cath Date:  11/16/2016;  Echocardiography Date: 10/30/2016;   ____________________________ FAMILY HISTORY Brother -- Brother alive with problem, Hypertension Father -- Coronary Artery Disease, Father dead Mother -- Coronary Artery Disease, Mother dead Sister -- Sister alive and well Sister -- Sister alive with problem, Carcinoma of breast Sister -- Sister dead ____________________________ SOCIAL HISTORY Alcohol Use:  does not use alcohol;  Smoking:  never smoked;  Diet:  regular diet;  Lifestyle:  married;  Exercise:  no regular exercise;  Residence:  lives with husband;   ____________________________ REVIEW OF SYSTEMS General:  obesity  Integumentary:rash on right arm seen by dermatologist and treated with triamcinalone cream Eyes: wears eye glasses/contact lenses Respiratory: denies dyspnea, cough, wheezing or hemoptysis. Cardiovascular:  please review HPI Abdominal: denies dyspepsia, GI  bleeding, constipation, or diarrheaGenitourinary-Female: no dysuria, urgency, frequency, UTIs, or stress incontinence Musculoskeletal:  arthritis of the knees, chronic low  back pain Neurological:  headaches  ____________________________ PHYSICAL EXAMINATION VITAL SIGNS  Blood Pressure:  150/90 Sitting, Left arm, large cuff  , 150/80 Standing, Left arm and large cuff   Pulse:  72/min. Weight:  181.00 lbs. Height:  63"BMI: 32  Constitutional:  pleasant white female, in no acute distress, mildly obese Skin:  warm and dry to touch, no apparent skin lesions, or masses noted. Head:  normocephalic, normal hair pattern, no masses or tenderness Neck:  healed surgical scar anterior neck, no JVD, no bruits, no masses Chest:  normal symmetry, clear to auscultation. Cardiac:  regular rhythm, normal S1 and S2, no S3 or S4, grade 2/6 systolic murmur at aortic area radiating to neck Abdomen:  abdomen soft,non-tender, no masses, no hepatospenomegaly, or aneurysm noted Peripheral Pulses:  the femoral,dorsalis pedis, and posterior tibial pulses are full and equal bilaterally with no bruits auscultated. Extremities & Back:  no deformities, clubbing, cyanosis, erythema or edema observed. Normal muscle strength and tone. Neurological:  no gross motor or sensory deficits noted, affect appropriate, oriented x3. ___________________________ IMPRESSIONS/PLAN  1. Ongoing chest discomfort with some atypical features with multiple risk factors 2. Abnormal myocardial perfusion scan with lateral wall ischemia 3. Untreated hyperlipidemia 4. Mild aortic valve disease 5. Hypertension 6. Family history of cardiac disease 7. Significant lumbar disc disease  Recommendations:  I've recommended cardiac catheterization to the patient. Her husband was with her today we discussed this extensively. Cardiac catheterization was discussed with the patient including risks of myocardial infarction, death, stroke, bleeding, arrhythmia,  dye allergy, or renal insufficiency. He understands and is willing to proceed. Possibility of percutaneous intervention at the same setting was also discussed with the patient including risks.  She understands that she will need to take dual antiplatelet therapy if she does have stenting and this may postpone any lumbar surgery. Her questions were fully answered today. ____________________________ TODAYS ORDERS  1. Comprehensive Metabolic Panel: Today  2. Complete Blood Count: Today  3. Draw PT/INR: Today  4. PTT: Today                       ____________________________ Cardiology Physician:  Kerry Hough MD [facility]

## 2016-11-28 NOTE — Interval H&P Note (Signed)
Cath Lab Visit (complete for each Cath Lab visit)  Clinical Evaluation Leading to the Procedure:   ACS: No.  Non-ACS:    Anginal Classification: CCS III  Anti-ischemic medical therapy: Minimal Therapy (1 class of medications)  Non-Invasive Test Results: Intermediate-risk stress test findings: cardiac mortality 1-3%/year  Prior CABG: No previous CABG      History and Physical Interval Note:  11/28/2016 7:49 AM  Erica Calhoun  has presented today for surgery, with the diagnosis of unstable angina  The various methods of treatment have been discussed with the patient and family. After consideration of risks, benefits and other options for treatment, the patient has consented to  Procedure(s): Left Heart Cath and Coronary Angiography (N/A) as a surgical intervention .  The patient's history has been reviewed, patient examined, no change in status, stable for surgery.  I have reviewed the patient's chart and labs.  Questions were answered to the patient's satisfaction.     Erica Calhoun

## 2016-11-30 DIAGNOSIS — M545 Low back pain: Secondary | ICD-10-CM | POA: Diagnosis not present

## 2016-11-30 DIAGNOSIS — M5126 Other intervertebral disc displacement, lumbar region: Secondary | ICD-10-CM | POA: Diagnosis not present

## 2016-12-13 DIAGNOSIS — E669 Obesity, unspecified: Secondary | ICD-10-CM | POA: Diagnosis not present

## 2016-12-13 DIAGNOSIS — E063 Autoimmune thyroiditis: Secondary | ICD-10-CM | POA: Diagnosis not present

## 2016-12-13 DIAGNOSIS — R590 Localized enlarged lymph nodes: Secondary | ICD-10-CM | POA: Diagnosis not present

## 2016-12-13 DIAGNOSIS — Z6831 Body mass index (BMI) 31.0-31.9, adult: Secondary | ICD-10-CM | POA: Diagnosis not present

## 2016-12-13 DIAGNOSIS — I10 Essential (primary) hypertension: Secondary | ICD-10-CM | POA: Diagnosis not present

## 2016-12-18 DIAGNOSIS — M5136 Other intervertebral disc degeneration, lumbar region: Secondary | ICD-10-CM | POA: Diagnosis not present

## 2016-12-18 DIAGNOSIS — M47817 Spondylosis without myelopathy or radiculopathy, lumbosacral region: Secondary | ICD-10-CM | POA: Diagnosis not present

## 2016-12-20 DIAGNOSIS — R590 Localized enlarged lymph nodes: Secondary | ICD-10-CM | POA: Diagnosis not present

## 2016-12-20 DIAGNOSIS — N134 Hydroureter: Secondary | ICD-10-CM | POA: Diagnosis not present

## 2016-12-20 DIAGNOSIS — D739 Disease of spleen, unspecified: Secondary | ICD-10-CM | POA: Diagnosis not present

## 2016-12-20 DIAGNOSIS — N1330 Unspecified hydronephrosis: Secondary | ICD-10-CM | POA: Diagnosis not present

## 2016-12-27 DIAGNOSIS — Z111 Encounter for screening for respiratory tuberculosis: Secondary | ICD-10-CM | POA: Diagnosis not present

## 2016-12-27 DIAGNOSIS — Z1231 Encounter for screening mammogram for malignant neoplasm of breast: Secondary | ICD-10-CM | POA: Diagnosis not present

## 2016-12-27 DIAGNOSIS — E063 Autoimmune thyroiditis: Secondary | ICD-10-CM | POA: Diagnosis not present

## 2016-12-27 DIAGNOSIS — E669 Obesity, unspecified: Secondary | ICD-10-CM | POA: Diagnosis not present

## 2016-12-27 DIAGNOSIS — Z6831 Body mass index (BMI) 31.0-31.9, adult: Secondary | ICD-10-CM | POA: Diagnosis not present

## 2016-12-27 DIAGNOSIS — R590 Localized enlarged lymph nodes: Secondary | ICD-10-CM | POA: Diagnosis not present

## 2016-12-27 DIAGNOSIS — I10 Essential (primary) hypertension: Secondary | ICD-10-CM | POA: Diagnosis not present

## 2016-12-27 DIAGNOSIS — M5412 Radiculopathy, cervical region: Secondary | ICD-10-CM | POA: Diagnosis not present

## 2017-01-01 DIAGNOSIS — M5137 Other intervertebral disc degeneration, lumbosacral region: Secondary | ICD-10-CM | POA: Diagnosis not present

## 2017-01-02 DIAGNOSIS — Z1231 Encounter for screening mammogram for malignant neoplasm of breast: Secondary | ICD-10-CM | POA: Diagnosis not present

## 2017-01-08 DIAGNOSIS — M5416 Radiculopathy, lumbar region: Secondary | ICD-10-CM | POA: Diagnosis not present

## 2017-01-08 DIAGNOSIS — M5136 Other intervertebral disc degeneration, lumbar region: Secondary | ICD-10-CM | POA: Diagnosis not present

## 2017-02-07 DIAGNOSIS — I10 Essential (primary) hypertension: Secondary | ICD-10-CM | POA: Diagnosis not present

## 2017-02-07 DIAGNOSIS — Z6831 Body mass index (BMI) 31.0-31.9, adult: Secondary | ICD-10-CM | POA: Diagnosis not present

## 2017-02-07 DIAGNOSIS — M545 Low back pain: Secondary | ICD-10-CM | POA: Diagnosis not present

## 2017-02-07 DIAGNOSIS — Z1389 Encounter for screening for other disorder: Secondary | ICD-10-CM | POA: Diagnosis not present

## 2017-02-07 DIAGNOSIS — E669 Obesity, unspecified: Secondary | ICD-10-CM | POA: Diagnosis not present

## 2017-02-07 DIAGNOSIS — E785 Hyperlipidemia, unspecified: Secondary | ICD-10-CM | POA: Diagnosis not present

## 2017-02-09 NOTE — Addendum Note (Signed)
Addendum  created 02/09/17 0937 by Nisaiah Bechtol, MD   Sign clinical note    

## 2017-02-12 DIAGNOSIS — M5440 Lumbago with sciatica, unspecified side: Secondary | ICD-10-CM | POA: Diagnosis not present

## 2017-04-10 DIAGNOSIS — M47817 Spondylosis without myelopathy or radiculopathy, lumbosacral region: Secondary | ICD-10-CM | POA: Diagnosis not present

## 2017-04-18 DIAGNOSIS — E785 Hyperlipidemia, unspecified: Secondary | ICD-10-CM | POA: Diagnosis not present

## 2017-04-18 DIAGNOSIS — I10 Essential (primary) hypertension: Secondary | ICD-10-CM | POA: Diagnosis not present

## 2017-04-18 DIAGNOSIS — E119 Type 2 diabetes mellitus without complications: Secondary | ICD-10-CM | POA: Diagnosis not present

## 2017-04-18 DIAGNOSIS — Z6830 Body mass index (BMI) 30.0-30.9, adult: Secondary | ICD-10-CM | POA: Diagnosis not present

## 2017-04-18 DIAGNOSIS — M818 Other osteoporosis without current pathological fracture: Secondary | ICD-10-CM | POA: Diagnosis not present

## 2017-04-18 DIAGNOSIS — E063 Autoimmune thyroiditis: Secondary | ICD-10-CM | POA: Diagnosis not present

## 2017-04-18 DIAGNOSIS — M545 Low back pain: Secondary | ICD-10-CM | POA: Diagnosis not present

## 2017-04-23 DIAGNOSIS — M47817 Spondylosis without myelopathy or radiculopathy, lumbosacral region: Secondary | ICD-10-CM | POA: Diagnosis not present

## 2017-05-08 DIAGNOSIS — M533 Sacrococcygeal disorders, not elsewhere classified: Secondary | ICD-10-CM | POA: Diagnosis not present

## 2017-05-08 DIAGNOSIS — R591 Generalized enlarged lymph nodes: Secondary | ICD-10-CM | POA: Diagnosis not present

## 2017-05-08 DIAGNOSIS — M47817 Spondylosis without myelopathy or radiculopathy, lumbosacral region: Secondary | ICD-10-CM | POA: Diagnosis not present

## 2017-05-16 DIAGNOSIS — D259 Leiomyoma of uterus, unspecified: Secondary | ICD-10-CM | POA: Diagnosis not present

## 2017-05-16 DIAGNOSIS — M533 Sacrococcygeal disorders, not elsewhere classified: Secondary | ICD-10-CM | POA: Diagnosis not present

## 2017-06-12 DIAGNOSIS — M47817 Spondylosis without myelopathy or radiculopathy, lumbosacral region: Secondary | ICD-10-CM | POA: Diagnosis not present

## 2017-06-26 ENCOUNTER — Other Ambulatory Visit (HOSPITAL_COMMUNITY): Payer: Self-pay | Admitting: Neurosurgery

## 2017-06-26 DIAGNOSIS — M533 Sacrococcygeal disorders, not elsewhere classified: Secondary | ICD-10-CM | POA: Diagnosis not present

## 2017-06-26 DIAGNOSIS — D259 Leiomyoma of uterus, unspecified: Secondary | ICD-10-CM | POA: Diagnosis not present

## 2017-07-04 ENCOUNTER — Ambulatory Visit (HOSPITAL_COMMUNITY)
Admission: RE | Admit: 2017-07-04 | Discharge: 2017-07-04 | Disposition: A | Discharge status: Home / Self Care | Payer: Medicare Other | Source: Ambulatory Visit | Attending: Neurosurgery | Admitting: Neurosurgery

## 2017-07-04 ENCOUNTER — Encounter (HOSPITAL_COMMUNITY)
Admission: RE | Admit: 2017-07-04 | Discharge: 2017-07-04 | Disposition: A | Discharge status: Home / Self Care | Payer: Medicare Other | Source: Ambulatory Visit | Attending: Neurosurgery | Admitting: Neurosurgery

## 2017-07-04 DIAGNOSIS — M47812 Spondylosis without myelopathy or radiculopathy, cervical region: Secondary | ICD-10-CM | POA: Insufficient documentation

## 2017-07-04 DIAGNOSIS — M47814 Spondylosis without myelopathy or radiculopathy, thoracic region: Secondary | ICD-10-CM | POA: Diagnosis not present

## 2017-07-04 DIAGNOSIS — M533 Sacrococcygeal disorders, not elsewhere classified: Secondary | ICD-10-CM

## 2017-07-04 DIAGNOSIS — M47816 Spondylosis without myelopathy or radiculopathy, lumbar region: Secondary | ICD-10-CM | POA: Insufficient documentation

## 2017-07-04 MED ORDER — TECHNETIUM TC 99M MEDRONATE IV KIT
25.0000 | PACK | Freq: Once | INTRAVENOUS | Status: AC | PRN
  Administered 2017-07-04: 25 via INTRAVENOUS

## 2017-07-13 DIAGNOSIS — M899 Disorder of bone, unspecified: Secondary | ICD-10-CM | POA: Diagnosis not present

## 2017-07-13 DIAGNOSIS — M5440 Lumbago with sciatica, unspecified side: Secondary | ICD-10-CM | POA: Diagnosis not present

## 2017-07-19 DIAGNOSIS — I10 Essential (primary) hypertension: Secondary | ICD-10-CM | POA: Diagnosis not present

## 2017-07-19 DIAGNOSIS — Z6832 Body mass index (BMI) 32.0-32.9, adult: Secondary | ICD-10-CM | POA: Diagnosis not present

## 2017-07-19 DIAGNOSIS — M47817 Spondylosis without myelopathy or radiculopathy, lumbosacral region: Secondary | ICD-10-CM | POA: Diagnosis not present

## 2017-07-23 DIAGNOSIS — E785 Hyperlipidemia, unspecified: Secondary | ICD-10-CM | POA: Diagnosis not present

## 2017-07-23 DIAGNOSIS — I10 Essential (primary) hypertension: Secondary | ICD-10-CM | POA: Diagnosis not present

## 2017-07-23 DIAGNOSIS — Z6832 Body mass index (BMI) 32.0-32.9, adult: Secondary | ICD-10-CM | POA: Diagnosis not present

## 2017-07-23 DIAGNOSIS — E063 Autoimmune thyroiditis: Secondary | ICD-10-CM | POA: Diagnosis not present

## 2017-07-23 DIAGNOSIS — Z79899 Other long term (current) drug therapy: Secondary | ICD-10-CM | POA: Diagnosis not present

## 2017-07-23 DIAGNOSIS — E559 Vitamin D deficiency, unspecified: Secondary | ICD-10-CM | POA: Diagnosis not present

## 2017-07-23 DIAGNOSIS — E119 Type 2 diabetes mellitus without complications: Secondary | ICD-10-CM | POA: Diagnosis not present

## 2017-07-23 DIAGNOSIS — M545 Low back pain: Secondary | ICD-10-CM | POA: Diagnosis not present

## 2017-07-24 ENCOUNTER — Other Ambulatory Visit: Payer: Self-pay | Admitting: Neurosurgery

## 2017-07-24 DIAGNOSIS — M899 Disorder of bone, unspecified: Secondary | ICD-10-CM

## 2017-07-30 DIAGNOSIS — D123 Benign neoplasm of transverse colon: Secondary | ICD-10-CM | POA: Diagnosis not present

## 2017-07-30 DIAGNOSIS — D126 Benign neoplasm of colon, unspecified: Secondary | ICD-10-CM | POA: Diagnosis not present

## 2017-07-30 DIAGNOSIS — Z1211 Encounter for screening for malignant neoplasm of colon: Secondary | ICD-10-CM | POA: Diagnosis not present

## 2017-07-30 DIAGNOSIS — D124 Benign neoplasm of descending colon: Secondary | ICD-10-CM | POA: Diagnosis not present

## 2017-07-30 DIAGNOSIS — K6389 Other specified diseases of intestine: Secondary | ICD-10-CM | POA: Diagnosis not present

## 2017-07-30 DIAGNOSIS — D128 Benign neoplasm of rectum: Secondary | ICD-10-CM | POA: Diagnosis not present

## 2017-08-01 ENCOUNTER — Ambulatory Visit
Admission: RE | Admit: 2017-08-01 | Discharge: 2017-08-01 | Disposition: A | Discharge status: Home / Self Care | Payer: Medicare Other | Source: Ambulatory Visit | Attending: Neurosurgery | Admitting: Neurosurgery

## 2017-08-01 DIAGNOSIS — R42 Dizziness and giddiness: Secondary | ICD-10-CM | POA: Diagnosis not present

## 2017-08-01 DIAGNOSIS — R51 Headache: Secondary | ICD-10-CM | POA: Diagnosis not present

## 2017-08-01 DIAGNOSIS — M899 Disorder of bone, unspecified: Secondary | ICD-10-CM

## 2017-08-09 DIAGNOSIS — M47817 Spondylosis without myelopathy or radiculopathy, lumbosacral region: Secondary | ICD-10-CM | POA: Diagnosis not present

## 2017-08-16 DIAGNOSIS — M47816 Spondylosis without myelopathy or radiculopathy, lumbar region: Secondary | ICD-10-CM | POA: Diagnosis not present

## 2017-09-06 DIAGNOSIS — M48062 Spinal stenosis, lumbar region with neurogenic claudication: Secondary | ICD-10-CM | POA: Diagnosis not present

## 2017-09-10 DIAGNOSIS — M47816 Spondylosis without myelopathy or radiculopathy, lumbar region: Secondary | ICD-10-CM | POA: Diagnosis not present

## 2017-09-14 DIAGNOSIS — Z1389 Encounter for screening for other disorder: Secondary | ICD-10-CM | POA: Diagnosis not present

## 2017-09-14 DIAGNOSIS — R140 Abdominal distension (gaseous): Secondary | ICD-10-CM | POA: Diagnosis not present

## 2017-09-14 DIAGNOSIS — R102 Pelvic and perineal pain: Secondary | ICD-10-CM | POA: Diagnosis not present

## 2017-09-14 DIAGNOSIS — R1909 Other intra-abdominal and pelvic swelling, mass and lump: Secondary | ICD-10-CM | POA: Diagnosis not present

## 2017-09-14 DIAGNOSIS — N858 Other specified noninflammatory disorders of uterus: Secondary | ICD-10-CM | POA: Diagnosis not present

## 2017-09-14 DIAGNOSIS — N840 Polyp of corpus uteri: Secondary | ICD-10-CM | POA: Diagnosis not present

## 2017-09-14 DIAGNOSIS — Z124 Encounter for screening for malignant neoplasm of cervix: Secondary | ICD-10-CM | POA: Diagnosis not present

## 2017-09-20 DIAGNOSIS — M47816 Spondylosis without myelopathy or radiculopathy, lumbar region: Secondary | ICD-10-CM | POA: Diagnosis not present

## 2017-10-16 DIAGNOSIS — M5440 Lumbago with sciatica, unspecified side: Secondary | ICD-10-CM | POA: Diagnosis not present

## 2017-10-23 ENCOUNTER — Other Ambulatory Visit: Payer: Self-pay | Admitting: Neurosurgery

## 2017-10-23 DIAGNOSIS — M47816 Spondylosis without myelopathy or radiculopathy, lumbar region: Secondary | ICD-10-CM | POA: Diagnosis not present

## 2017-10-23 DIAGNOSIS — M545 Low back pain: Secondary | ICD-10-CM | POA: Diagnosis not present

## 2017-10-23 DIAGNOSIS — M5440 Lumbago with sciatica, unspecified side: Secondary | ICD-10-CM | POA: Diagnosis not present

## 2017-10-23 DIAGNOSIS — M48062 Spinal stenosis, lumbar region with neurogenic claudication: Secondary | ICD-10-CM | POA: Diagnosis not present

## 2017-10-29 DIAGNOSIS — M545 Low back pain: Secondary | ICD-10-CM | POA: Diagnosis not present

## 2017-10-29 DIAGNOSIS — E669 Obesity, unspecified: Secondary | ICD-10-CM | POA: Diagnosis not present

## 2017-10-29 DIAGNOSIS — Z79899 Other long term (current) drug therapy: Secondary | ICD-10-CM | POA: Diagnosis not present

## 2017-10-29 DIAGNOSIS — E559 Vitamin D deficiency, unspecified: Secondary | ICD-10-CM | POA: Diagnosis not present

## 2017-10-29 DIAGNOSIS — I10 Essential (primary) hypertension: Secondary | ICD-10-CM | POA: Diagnosis not present

## 2017-10-29 DIAGNOSIS — E063 Autoimmune thyroiditis: Secondary | ICD-10-CM | POA: Diagnosis not present

## 2017-10-29 DIAGNOSIS — E119 Type 2 diabetes mellitus without complications: Secondary | ICD-10-CM | POA: Diagnosis not present

## 2017-10-29 DIAGNOSIS — Z6833 Body mass index (BMI) 33.0-33.9, adult: Secondary | ICD-10-CM | POA: Diagnosis not present

## 2017-10-31 NOTE — Pre-Procedure Instructions (Signed)
Erica Calhoun  10/31/2017      [place], [place] [address] Phone: [phone] Fax: [phone]    Your procedure is scheduled on : Monday, March 4th   Report to [facility] Admitting at  10:00 AM.             (posted surgery time 12n - 3:52p)   Call this number if you have problems the morning of surgery:  [phone]   Remember:               4-5 days prior to surgery, STOP TAKING any Blood thinners, Vitamins, Herbal Supplements, Anti-inflammatories.   Do not eat food or drink liquids after midnight, Sunday.   Take these medicines the morning of surgery with A SIP OF WATER : Levothyroxine, Metoprolol, Tramadol.   Do not wear jewelry, make-up or nail polish.  Do not wear lotions, powders, perfumes, or deodorant.  Do not shave 48 hours prior to surgery.   Do not bring valuables to the hospital.  [facility] is not responsible for any belongings or valuables.  Contacts, dentures or bridgework may not be worn into surgery.  Leave your suitcase in the car.  After surgery it may be brought to your room.  For patients admitted to the hospital, discharge time will be determined by your treatment team.  Please read over the following fact sheets that you were given. Pain Booklet, MRSA Information and Surgical Site Infection Prevention      [place]- Preparing For Surgery  Before surgery, you can play an important role. Because skin is not sterile, your skin needs to be as free of germs as possible. You can reduce the number of germs on your skin by washing with CHG (chlorahexidine gluconate) Soap before surgery.  CHG is an antiseptic cleaner which kills germs and bonds with the skin to continue killing germs even after washing.  Please do not use if you have an allergy to CHG or antibacterial soaps. If your skin becomes reddened/irritated stop using the CHG.  Do not shave  (including legs and underarms) for at least 48 hours prior to first CHG shower. It is OK to shave your face.  Please follow these instructions carefully.   1. Shower the NIGHT BEFORE SURGERY and the MORNING OF SURGERY with CHG.   2. If you chose to wash your hair, wash your hair first as usual with your normal shampoo.  3. After you shampoo, rinse your hair and body thoroughly to remove the shampoo.  4. Use CHG as you would any other liquid soap. You can apply CHG directly to the skin and wash gently with a scrungie or a clean washcloth.   5. Apply the CHG Soap to your body ONLY FROM THE NECK DOWN.  Do not use on open wounds or open sores. Avoid contact with your eyes, ears, mouth and genitals (private parts). Wash Face and genitals (private parts)  with your normal soap.  6. Wash thoroughly, paying special attention to the area where your surgery will be performed.  7. Thoroughly rinse your body with warm water from the neck down.  8. DO NOT shower/wash with your normal soap after using and rinsing off the CHG Soap.  9. Pat yourself dry with a CLEAN TOWEL.  10. Wear CLEAN PAJAMAS to bed the night before surgery, wear comfortable clothes the morning of surgery  11.  Place CLEAN SHEETS on your bed the night of your first shower and DO NOT SLEEP WITH PETS.    Day of Surgery: Do not apply any deodorants/lotions. Please wear clean clothes to the hospital/surgery center.

## 2017-10-31 NOTE — Pre-Procedure Instructions (Signed)
Erica Calhoun  10/31/2017      [place], [place] [address] Phone: [phone] Fax: [phone]    Your procedure is scheduled on  Monday March 4 .  Report to [facility] Admitting at 1000  A.M.  Call this number if you have problems the morning of surgery:  [phone]   Remember:  Do not eat food or drink liquids after midnight.  Take these medicines the morning of surgery with A SIP OF WATER Refresh eye drops if needed, Flonase spray if needed, Levothyroxine (Synthroid), Loratadine (Claritin) if needed, Metoprolol Succinate (Toprol-XL), Tramadol (Ultram) if needed  Stop taking aspirin as directed by your Dr.  Stop taking BC's, Goody's, herbal medications, Fish Oil, Ibuprofen, Advil, Motrin, Aleve, Vitamins    Do not wear jewelry, make-up or nail polish.  Do not wear lotions, powders, or perfumes, or deodorant.  Do not shave 48 hours prior to surgery.  Men may shave face and neck.  Do not bring valuables to the hospital.  [facility] is not responsible for any belongings or valuables.  Contacts, dentures or bridgework may not be worn into surgery.  Leave your suitcase in the car.  After surgery it may be brought to your room.  For patients admitted to the hospital, discharge time will be determined by your treatment team.  Patients discharged the day of surgery will not be allowed to drive home.   Special instructions:  [place] - Preparing for Surgery  Before surgery, you can play an important role.  Because skin is not sterile, your skin needs to be as free of germs as possible.  You can reduce the number of germs on you skin by washing with CHG (chlorahexidine gluconate) soap before surgery.  CHG is an antiseptic cleaner which kills germs and bonds with the skin to continue killing germs even after washing.  Please DO NOT use if you have an allergy to CHG or antibacterial  soaps.  If your skin becomes reddened/irritated stop using the CHG and inform your nurse when you arrive at Short Stay.  Do not shave (including legs and underarms) for at least 48 hours prior to the first CHG shower.  You may shave your face.  Please follow these instructions carefully:   1.  Shower with CHG Soap the night before surgery and the  morning of Surgery.  2.  If you choose to wash your hair, wash your hair first as usual with your   normal shampoo.  3.  After you shampoo, rinse your hair and body thoroughly to remove the Shampoo.  4.  Use CHG as you would any other liquid soap.  You can apply chg directly to the skin and wash gently with scrungie or a clean washcloth.  5.  Apply the CHG Soap to your body ONLY FROM THE NECK DOWN.  Do not use on open wounds or open sores.  Avoid contact with your eyes, ears, mouth and genitals (private parts).  Wash genitals (private parts) with your normal soap.  6.  Wash thoroughly, paying special attention to the area where your surgery will be performed.  7.  Thoroughly rinse your body with warm water from the neck down.  8.  DO NOT shower/wash with your normal soap after using and rinsing off   the CHG Soap.  9.  Pat yourself dry with a clean towel.  10.  Wear clean pajamas.            11.  Place clean sheets on your bed the night of your first shower and do not sleep with pets.  Day of Surgery  Do not apply any lotions/deoderants the morning of surgery.  Please wear clean clothes to the hospital/surgery center.     Please read over the following fact sheets that you were given. Pain Booklet, Coughing and Deep Breathing and Surgical Site Infection Prevention

## 2017-11-01 ENCOUNTER — Encounter (HOSPITAL_COMMUNITY)
Admission: RE | Admit: 2017-11-01 | Discharge: 2017-11-01 | Disposition: A | Discharge status: Home / Self Care | Payer: Medicare Other | Source: Ambulatory Visit | Attending: Neurosurgery | Admitting: Neurosurgery

## 2017-11-01 ENCOUNTER — Other Ambulatory Visit: Payer: Self-pay

## 2017-11-01 ENCOUNTER — Encounter (HOSPITAL_COMMUNITY): Payer: Self-pay

## 2017-11-01 DIAGNOSIS — Z01812 Encounter for preprocedural laboratory examination: Secondary | ICD-10-CM | POA: Insufficient documentation

## 2017-11-01 DIAGNOSIS — E039 Hypothyroidism, unspecified: Secondary | ICD-10-CM | POA: Insufficient documentation

## 2017-11-01 DIAGNOSIS — I2510 Atherosclerotic heart disease of native coronary artery without angina pectoris: Secondary | ICD-10-CM | POA: Diagnosis not present

## 2017-11-01 DIAGNOSIS — I10 Essential (primary) hypertension: Secondary | ICD-10-CM | POA: Diagnosis not present

## 2017-11-01 DIAGNOSIS — R011 Cardiac murmur, unspecified: Secondary | ICD-10-CM | POA: Diagnosis not present

## 2017-11-01 DIAGNOSIS — E785 Hyperlipidemia, unspecified: Secondary | ICD-10-CM | POA: Insufficient documentation

## 2017-11-01 HISTORY — DX: Crossing vessel and stricture of ureter without hydronephrosis: N13.5

## 2017-11-01 HISTORY — DX: Chest pain, unspecified: R07.9

## 2017-11-01 LAB — BASIC METABOLIC PANEL
Anion gap: 10 (ref 5–15)
BUN: 9 mg/dL (ref 6–20)
CHLORIDE: 101 mmol/L (ref 101–111)
CO2: 25 mmol/L (ref 22–32)
Calcium: 9.5 mg/dL (ref 8.9–10.3)
Creatinine, Ser: 0.56 mg/dL (ref 0.44–1.00)
Glucose, Bld: 105 mg/dL — ABNORMAL HIGH (ref 65–99)
POTASSIUM: 4.7 mmol/L (ref 3.5–5.1)
SODIUM: 136 mmol/L (ref 135–145)

## 2017-11-01 LAB — CBC
HCT: 40.1 % (ref 36.0–46.0)
HEMOGLOBIN: 14.5 g/dL (ref 12.0–15.0)
MCH: 30.9 pg (ref 26.0–34.0)
MCHC: 36.2 g/dL — ABNORMAL HIGH (ref 30.0–36.0)
MCV: 85.5 fL (ref 78.0–100.0)
PLATELETS: 194 10*3/uL (ref 150–400)
RBC: 4.69 MIL/uL (ref 3.87–5.11)
RDW: 12.6 % (ref 11.5–15.5)
WBC: 6.2 10*3/uL (ref 4.0–10.5)

## 2017-11-01 LAB — TYPE AND SCREEN
ABO/RH(D): O NEG
ANTIBODY SCREEN: NEGATIVE

## 2017-11-01 LAB — ABO/RH: ABO/RH(D): O NEG

## 2017-11-01 LAB — SURGICAL PCR SCREEN
MRSA, PCR: NEGATIVE
STAPHYLOCOCCUS AUREUS: POSITIVE — AB

## 2017-11-01 NOTE — Progress Notes (Signed)
H/O of heart murmur.  Denis any sob, cp at this time Has been off aspirin x 1 week so far. Did have other surgery back in 2018.  At that time, she went to see her cardio Dr. Thurman Coyer.  Had stress test & heart cath both in 2018.  LOV 11/2016.  Patient states that she was told that if she has any problems or concerns to go back to see him. PCP is Dr. Wende Neighbors  LOV 10/2017

## 2017-11-02 ENCOUNTER — Encounter (HOSPITAL_COMMUNITY): Payer: Self-pay

## 2017-11-02 NOTE — Progress Notes (Signed)
Anesthesia chart review: Patient is a 67 year old female scheduled for XLIF L2-3 lateral plate, left sided approach on 11/05/17 by Dr. Kary Kos.  History includes never smoker, hypertension, dyslipidemia, murmur (mild AS 11/2016), chest pain (s/p LHC showing minimal CAD 11/2016), hypothyroidism, arthritis, appendectomy '78, C4-7 ACDF 10/02/16.   - PCP is Dr. Wende Neighbors. - Cardiologist is Dr. Tollie Eth. She was initially sent to Dr. Wynonia Lawman prior to ACDF last January for preoperative evaluation given history of chest pain. Also with history of dyslipidemia and family history of CAD (parents with CAD, diagnosed > age 49 years). It was thought that he symptoms may be coming from her neck issues, so no further testing ordered at that time; however, following surgery she continued to have symptoms, so a myocardial scan on 11/16/16 was done and showed "a moderate reversible lateral wall defect consistent with ischemia." Cardiac cath was ordered which which showed only minimal CAD, mild AS. He cleared her for back surgery following 12/13/16 office visit. One year follow-up recommended.    Meds include aspirin 81 mg (holding X 1 week), on hold), Flonase, levothyroxine, Claritin, losartan-HCTZ, Toprol XL, tramadol.   BP (!) 152/72   Pulse (!) 58   Temp 36.5 C   Resp 20   Ht 5\' 2"  (1.575 m)   Wt 187 lb 14.4 oz (85.2 kg)   SpO2 99%   BMI 34.37 kg/m   EKG 11/28/16 (done prior to  [facility]): NSR, possible LAE, incomplete right BBB, cannot rule out inferior infarct (age undetermined).   Cardiac cath 11/28/16:  Mid LAD lesion, 10 %stenosed.  Dist LAD lesion, 10 %stenosed.  Mid Cx lesion, 10 %stenosed.  The left ventricular systolic function is normal.  LV end diastolic pressure is normal.  There is mild aortic valve stenosis. - Normal LV function with an ejection fraction of 60%.  There is evidence for mild LVH. - The aortic valve is not significantly calcified.  There is mild aortic stenosis with a  peak to peak gradient of 16 mm. - No significant coronary obstructive disease with a 10% narrowings in the mid LAD, and distal circumflex.  Normal dominant RCA. RECOMMENDATION: The present study does not demonstrate any significant coronary obstructive disease. There is very mild aortic valve stenosis.  The patient will return to the cardiology care of Dr. Wynonia Lawman.  Nuclear stress test 11/16/16 (Dr. Wynonia Lawman): Impression: 1.  Abnormal Lexiscan Myoview scan with evidence of lateral wall ischemia. 2.  Normal quantitative gated SPECT ejection fraction 73% with normal wall motion and wall thickening. Recommendations.  This is abnormal intermediate risk scan suggestive of ischemia.  Further evaluation for the extent of CAD is recommended. (s/p cath 11/28/16)  Echo 10/30/16 (Dr. Wynonia Lawman): Conclusion: 1.  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  Doppler evidence of grade 1 (impaired) diastolic dysfunction.  Estimated EF 60%. 2.  Mildly thickened aortic valve with mild aortic stenosis and mild to moderate regurgitation. AV pk vel 2.23 m/s, AV pk grad 19.9 mmHg, AV mean grad 10.0 mmHg, AVA Vmax 1.36 cm2. 3.  Mild tricuspid regurgitation.  Preoperative labs noted.   Patient had cardiac cath and echo ~ 1 year ago. She denied chest pain, SOB. Asked PAT RN to encourage her to continue routine follow-up with Dr. Wynonia Lawman, but with recent testing and no CV symptoms I anticipate that she can proceed as planned.  George Hugh [facility] Short Stay Center/Anesthesiology Phone [phone] 11/02/2017 2:51 PM

## 2017-11-05 ENCOUNTER — Inpatient Hospital Stay (HOSPITAL_COMMUNITY): Payer: Medicare Other | Admitting: Vascular Surgery

## 2017-11-05 ENCOUNTER — Encounter (HOSPITAL_COMMUNITY): Payer: Self-pay | Admitting: *Deleted

## 2017-11-05 ENCOUNTER — Inpatient Hospital Stay (HOSPITAL_COMMUNITY)
Admission: RE | Admit: 2017-11-05 | Discharge: 2017-11-06 | DRG: 460 | Disposition: A | Discharge status: Home / Self Care | Payer: Medicare Other | Source: Ambulatory Visit | Attending: Neurosurgery | Admitting: Neurosurgery

## 2017-11-05 ENCOUNTER — Inpatient Hospital Stay (HOSPITAL_COMMUNITY)
Admission: RE | Disposition: A | Discharge status: Home / Self Care | Payer: Self-pay | Source: Ambulatory Visit | Attending: Neurosurgery

## 2017-11-05 ENCOUNTER — Inpatient Hospital Stay (HOSPITAL_COMMUNITY): Payer: Medicare Other | Admitting: Anesthesiology

## 2017-11-05 ENCOUNTER — Observation Stay (HOSPITAL_COMMUNITY): Payer: Medicare Other

## 2017-11-05 ENCOUNTER — Other Ambulatory Visit: Payer: Self-pay

## 2017-11-05 DIAGNOSIS — Z880 Allergy status to penicillin: Secondary | ICD-10-CM | POA: Diagnosis not present

## 2017-11-05 DIAGNOSIS — Z886 Allergy status to analgesic agent status: Secondary | ICD-10-CM | POA: Diagnosis not present

## 2017-11-05 DIAGNOSIS — M419 Scoliosis, unspecified: Secondary | ICD-10-CM | POA: Diagnosis not present

## 2017-11-05 DIAGNOSIS — M4326 Fusion of spine, lumbar region: Secondary | ICD-10-CM | POA: Diagnosis not present

## 2017-11-05 DIAGNOSIS — E785 Hyperlipidemia, unspecified: Secondary | ICD-10-CM | POA: Diagnosis not present

## 2017-11-05 DIAGNOSIS — Z981 Arthrodesis status: Secondary | ICD-10-CM

## 2017-11-05 DIAGNOSIS — Z91048 Other nonmedicinal substance allergy status: Secondary | ICD-10-CM

## 2017-11-05 DIAGNOSIS — Z79890 Hormone replacement therapy (postmenopausal): Secondary | ICD-10-CM

## 2017-11-05 DIAGNOSIS — E039 Hypothyroidism, unspecified: Secondary | ICD-10-CM | POA: Diagnosis present

## 2017-11-05 DIAGNOSIS — M48061 Spinal stenosis, lumbar region without neurogenic claudication: Secondary | ICD-10-CM | POA: Diagnosis not present

## 2017-11-05 DIAGNOSIS — M1990 Unspecified osteoarthritis, unspecified site: Secondary | ICD-10-CM | POA: Diagnosis not present

## 2017-11-05 DIAGNOSIS — Z7982 Long term (current) use of aspirin: Secondary | ICD-10-CM | POA: Diagnosis not present

## 2017-11-05 DIAGNOSIS — I10 Essential (primary) hypertension: Secondary | ICD-10-CM | POA: Diagnosis not present

## 2017-11-05 DIAGNOSIS — M5136 Other intervertebral disc degeneration, lumbar region: Secondary | ICD-10-CM | POA: Diagnosis present

## 2017-11-05 DIAGNOSIS — Z6834 Body mass index (BMI) 34.0-34.9, adult: Secondary | ICD-10-CM

## 2017-11-05 DIAGNOSIS — R402142 Coma scale, eyes open, spontaneous, at arrival to emergency department: Secondary | ICD-10-CM | POA: Diagnosis present

## 2017-11-05 DIAGNOSIS — Z885 Allergy status to narcotic agent status: Secondary | ICD-10-CM

## 2017-11-05 DIAGNOSIS — Z419 Encounter for procedure for purposes other than remedying health state, unspecified: Secondary | ICD-10-CM

## 2017-11-05 DIAGNOSIS — M4186 Other forms of scoliosis, lumbar region: Secondary | ICD-10-CM | POA: Diagnosis not present

## 2017-11-05 DIAGNOSIS — M5020 Other cervical disc displacement, unspecified cervical region: Secondary | ICD-10-CM | POA: Diagnosis not present

## 2017-11-05 DIAGNOSIS — Z7951 Long term (current) use of inhaled steroids: Secondary | ICD-10-CM

## 2017-11-05 DIAGNOSIS — M4316 Spondylolisthesis, lumbar region: Secondary | ICD-10-CM | POA: Diagnosis not present

## 2017-11-05 DIAGNOSIS — R402362 Coma scale, best motor response, obeys commands, at arrival to emergency department: Secondary | ICD-10-CM | POA: Diagnosis present

## 2017-11-05 DIAGNOSIS — R402252 Coma scale, best verbal response, oriented, at arrival to emergency department: Secondary | ICD-10-CM | POA: Diagnosis present

## 2017-11-05 DIAGNOSIS — E669 Obesity, unspecified: Secondary | ICD-10-CM | POA: Diagnosis present

## 2017-11-05 DIAGNOSIS — M4180 Other forms of scoliosis, site unspecified: Secondary | ICD-10-CM | POA: Diagnosis present

## 2017-11-05 HISTORY — PX: ANTERIOR LAT LUMBAR FUSION: SHX1168

## 2017-11-05 SURGERY — ANTERIOR LATERAL LUMBAR FUSION 1 LEVEL
Anesthesia: General | Site: Spine Lumbar | Laterality: Left

## 2017-11-05 MED ORDER — HYDROMORPHONE HCL 1 MG/ML IJ SOLN
0.5000 mg | INTRAMUSCULAR | Status: DC | PRN
  Administered 2017-11-05: 0.5 mg via INTRAVENOUS
  Filled 2017-11-05: qty 0.5

## 2017-11-05 MED ORDER — ACETAMINOPHEN 10 MG/ML IV SOLN
1000.0000 mg | Freq: Once | INTRAVENOUS | Status: AC
  Administered 2017-11-05: 1000 mg via INTRAVENOUS

## 2017-11-05 MED ORDER — SODIUM CHLORIDE 0.9 % IR SOLN
Status: DC | PRN
  Administered 2017-11-05: 15:00:00

## 2017-11-05 MED ORDER — PROPOFOL 1000 MG/100ML IV EMUL
INTRAVENOUS | Status: AC
  Filled 2017-11-05: qty 100

## 2017-11-05 MED ORDER — CHLORHEXIDINE GLUCONATE CLOTH 2 % EX PADS: 6.0000 | MEDICATED_PAD | Freq: Once | CUTANEOUS | Status: DC

## 2017-11-05 MED ORDER — ONDANSETRON HCL 4 MG/2ML IJ SOLN
INTRAMUSCULAR | Status: AC
  Filled 2017-11-05: qty 2

## 2017-11-05 MED ORDER — PROPOFOL 10 MG/ML IV BOLUS
INTRAVENOUS | Status: DC | PRN
  Administered 2017-11-05: 150 mg via INTRAVENOUS

## 2017-11-05 MED ORDER — DEXAMETHASONE SODIUM PHOSPHATE 10 MG/ML IJ SOLN
INTRAMUSCULAR | Status: AC
  Administered 2017-11-05: 10 mg via INTRAVENOUS
  Filled 2017-11-05: qty 1

## 2017-11-05 MED ORDER — MIDAZOLAM HCL 2 MG/2ML IJ SOLN
INTRAMUSCULAR | Status: AC
  Filled 2017-11-05: qty 2

## 2017-11-05 MED ORDER — LIDOCAINE-EPINEPHRINE 1 %-1:100000 IJ SOLN
INTRAMUSCULAR | Status: DC | PRN
  Administered 2017-11-05: 9 mL

## 2017-11-05 MED ORDER — GLYCOPYRROLATE 0.2 MG/ML IJ SOLN
INTRAMUSCULAR | Status: DC | PRN
  Administered 2017-11-05: 0.2 mg via INTRAVENOUS

## 2017-11-05 MED ORDER — PROPOFOL 500 MG/50ML IV EMUL
INTRAVENOUS | Status: DC | PRN
  Administered 2017-11-05: 50 ug/kg/min via INTRAVENOUS
  Administered 2017-11-05: 75 ug/kg/min via INTRAVENOUS

## 2017-11-05 MED ORDER — ROCURONIUM BROMIDE 10 MG/ML (PF) SYRINGE
PREFILLED_SYRINGE | INTRAVENOUS | Status: AC
  Filled 2017-11-05: qty 5

## 2017-11-05 MED ORDER — OXYCODONE HCL 5 MG PO TABS
10.0000 mg | ORAL_TABLET | ORAL | Status: DC | PRN
  Administered 2017-11-05 – 2017-11-06 (×5): 10 mg via ORAL
  Filled 2017-11-05 (×5): qty 2

## 2017-11-05 MED ORDER — ACETAMINOPHEN 325 MG PO TABS
650.0000 mg | ORAL_TABLET | ORAL | Status: DC | PRN
  Administered 2017-11-05 – 2017-11-06 (×2): 650 mg via ORAL
  Filled 2017-11-05 (×2): qty 2

## 2017-11-05 MED ORDER — LIDOCAINE-EPINEPHRINE 1 %-1:100000 IJ SOLN
INTRAMUSCULAR | Status: AC
  Filled 2017-11-05: qty 1

## 2017-11-05 MED ORDER — SENNA 8.6 MG PO TABS
1.0000 | ORAL_TABLET | Freq: Two times a day (BID) | ORAL | Status: DC
  Administered 2017-11-05 – 2017-11-06 (×2): 8.6 mg via ORAL
  Filled 2017-11-05 (×2): qty 1

## 2017-11-05 MED ORDER — DEXAMETHASONE SODIUM PHOSPHATE 10 MG/ML IJ SOLN
10.0000 mg | INTRAMUSCULAR | Status: AC
  Administered 2017-11-05: 10 mg via INTRAVENOUS
  Filled 2017-11-05: qty 1

## 2017-11-05 MED ORDER — OXYCODONE HCL 5 MG/5ML PO SOLN: 5.0000 mg | Freq: Once | ORAL | Status: DC | PRN

## 2017-11-05 MED ORDER — LIDOCAINE 2% (20 MG/ML) 5 ML SYRINGE
INTRAMUSCULAR | Status: AC
  Filled 2017-11-05: qty 5

## 2017-11-05 MED ORDER — DEXAMETHASONE SODIUM PHOSPHATE 10 MG/ML IJ SOLN
INTRAMUSCULAR | Status: AC
  Filled 2017-11-05: qty 1

## 2017-11-05 MED ORDER — PANTOPRAZOLE SODIUM 40 MG IV SOLR: 40.0000 mg | Freq: Every day | INTRAVENOUS | Status: DC

## 2017-11-05 MED ORDER — POLYETHYLENE GLYCOL 3350 17 G PO PACK
17.0000 g | PACK | Freq: Every day | ORAL | Status: DC | PRN
  Administered 2017-11-05: 17 g via ORAL
  Filled 2017-11-05: qty 1

## 2017-11-05 MED ORDER — FENTANYL CITRATE (PF) 100 MCG/2ML IJ SOLN
INTRAMUSCULAR | Status: AC
  Administered 2017-11-05: 50 ug via INTRAVENOUS
  Filled 2017-11-05: qty 2

## 2017-11-05 MED ORDER — SUGAMMADEX SODIUM 200 MG/2ML IV SOLN
INTRAVENOUS | Status: AC
  Filled 2017-11-05: qty 2

## 2017-11-05 MED ORDER — ONDANSETRON HCL 4 MG PO TABS: 4.0000 mg | ORAL_TABLET | Freq: Four times a day (QID) | ORAL | Status: DC | PRN

## 2017-11-05 MED ORDER — FENTANYL CITRATE (PF) 100 MCG/2ML IJ SOLN
25.0000 ug | INTRAMUSCULAR | Status: DC | PRN
  Administered 2017-11-05 (×3): 50 ug via INTRAVENOUS

## 2017-11-05 MED ORDER — METHOCARBAMOL 500 MG PO TABS
500.0000 mg | ORAL_TABLET | Freq: Four times a day (QID) | ORAL | Status: DC | PRN
  Administered 2017-11-05 – 2017-11-06 (×2): 500 mg via ORAL
  Filled 2017-11-05 (×3): qty 1

## 2017-11-05 MED ORDER — PHENYLEPHRINE HCL 10 MG/ML IJ SOLN
INTRAVENOUS | Status: DC | PRN
  Administered 2017-11-05: 10 ug/min via INTRAVENOUS

## 2017-11-05 MED ORDER — DOCUSATE SODIUM 100 MG PO CAPS
100.0000 mg | ORAL_CAPSULE | Freq: Two times a day (BID) | ORAL | Status: DC
  Administered 2017-11-05 – 2017-11-06 (×2): 100 mg via ORAL
  Filled 2017-11-05 (×2): qty 1

## 2017-11-05 MED ORDER — FENTANYL CITRATE (PF) 250 MCG/5ML IJ SOLN
INTRAMUSCULAR | Status: AC
  Filled 2017-11-05: qty 5

## 2017-11-05 MED ORDER — LIDOCAINE 2% (20 MG/ML) 5 ML SYRINGE
INTRAMUSCULAR | Status: DC | PRN
  Administered 2017-11-05: 80 mg via INTRAVENOUS

## 2017-11-05 MED ORDER — DEXAMETHASONE SODIUM PHOSPHATE 10 MG/ML IJ SOLN
10.0000 mg | Freq: Once | INTRAMUSCULAR | Status: AC
  Administered 2017-11-05: 10 mg via INTRAVENOUS

## 2017-11-05 MED ORDER — THROMBIN (RECOMBINANT) 5000 UNITS EX SOLR
OROMUCOSAL | Status: DC | PRN
  Administered 2017-11-05: 15:00:00 via TOPICAL

## 2017-11-05 MED ORDER — SODIUM CHLORIDE 0.9 % IV SOLN: INTRAVENOUS | Status: DC

## 2017-11-05 MED ORDER — SUCCINYLCHOLINE CHLORIDE 20 MG/ML IJ SOLN
INTRAMUSCULAR | Status: AC
  Filled 2017-11-05: qty 1

## 2017-11-05 MED ORDER — MIDAZOLAM HCL 2 MG/2ML IJ SOLN
INTRAMUSCULAR | Status: DC | PRN
  Administered 2017-11-05: 2 mg via INTRAVENOUS

## 2017-11-05 MED ORDER — FENTANYL CITRATE (PF) 250 MCG/5ML IJ SOLN
INTRAMUSCULAR | Status: AC
Start: 2017-11-05 — End: 2017-11-05
  Filled 2017-11-05: qty 5

## 2017-11-05 MED ORDER — SUCCINYLCHOLINE CHLORIDE 200 MG/10ML IV SOSY
PREFILLED_SYRINGE | INTRAVENOUS | Status: DC | PRN
  Administered 2017-11-05: 120 mg via INTRAVENOUS

## 2017-11-05 MED ORDER — ACETAMINOPHEN 10 MG/ML IV SOLN
INTRAVENOUS | Status: AC
  Administered 2017-11-05: 1000 mg via INTRAVENOUS
  Filled 2017-11-05: qty 100

## 2017-11-05 MED ORDER — FENTANYL CITRATE (PF) 100 MCG/2ML IJ SOLN
50.0000 ug | INTRAMUSCULAR | Status: DC | PRN
  Administered 2017-11-05: 50 ug via INTRAVENOUS

## 2017-11-05 MED ORDER — SODIUM CHLORIDE 0.9% FLUSH: 3.0000 mL | Freq: Two times a day (BID) | INTRAVENOUS | Status: DC

## 2017-11-05 MED ORDER — 0.9 % SODIUM CHLORIDE (POUR BTL) OPTIME
TOPICAL | Status: DC | PRN
  Administered 2017-11-05: 1000 mL

## 2017-11-05 MED ORDER — ONDANSETRON HCL 4 MG/2ML IJ SOLN
INTRAMUSCULAR | Status: DC | PRN
  Administered 2017-11-05: 4 mg via INTRAVENOUS

## 2017-11-05 MED ORDER — SODIUM CHLORIDE 0.9 % IV SOLN: 250.0000 mL | INTRAVENOUS | Status: DC

## 2017-11-05 MED ORDER — ACETAMINOPHEN 650 MG RE SUPP: 650.0000 mg | RECTAL | Status: DC | PRN

## 2017-11-05 MED ORDER — MENTHOL 3 MG MT LOZG: 1.0000 | LOZENGE | OROMUCOSAL | Status: DC | PRN

## 2017-11-05 MED ORDER — METHOCARBAMOL 1000 MG/10ML IJ SOLN
1000.0000 mg | Freq: Once | INTRAVENOUS | Status: AC
  Administered 2017-11-05: 1000 mg via INTRAVENOUS
  Filled 2017-11-05: qty 10

## 2017-11-05 MED ORDER — LACTATED RINGERS IV SOLN
INTRAVENOUS | Status: DC
  Administered 2017-11-05 (×3): via INTRAVENOUS

## 2017-11-05 MED ORDER — PHENYLEPHRINE 40 MCG/ML (10ML) SYRINGE FOR IV PUSH (FOR BLOOD PRESSURE SUPPORT)
PREFILLED_SYRINGE | INTRAVENOUS | Status: AC
  Filled 2017-11-05: qty 10

## 2017-11-05 MED ORDER — VANCOMYCIN HCL 10 G IV SOLR
1500.0000 mg | Freq: Once | INTRAVENOUS | Status: AC
  Administered 2017-11-05: 1500 mg via INTRAVENOUS
  Filled 2017-11-05: qty 1500

## 2017-11-05 MED ORDER — ONDANSETRON HCL 4 MG/2ML IJ SOLN: 4.0000 mg | Freq: Four times a day (QID) | INTRAMUSCULAR | Status: DC | PRN

## 2017-11-05 MED ORDER — PHENYLEPHRINE 40 MCG/ML (10ML) SYRINGE FOR IV PUSH (FOR BLOOD PRESSURE SUPPORT)
PREFILLED_SYRINGE | INTRAVENOUS | Status: DC | PRN
  Administered 2017-11-05 (×2): 80 ug via INTRAVENOUS

## 2017-11-05 MED ORDER — ONDANSETRON HCL 4 MG/2ML IJ SOLN: 4.0000 mg | Freq: Once | INTRAMUSCULAR | Status: DC | PRN

## 2017-11-05 MED ORDER — THROMBIN 5000 UNITS EX SOLR
CUTANEOUS | Status: AC
  Filled 2017-11-05: qty 5000

## 2017-11-05 MED ORDER — SODIUM CHLORIDE 0.9% FLUSH: 3.0000 mL | INTRAVENOUS | Status: DC | PRN

## 2017-11-05 MED ORDER — METHOCARBAMOL 1000 MG/10ML IJ SOLN
500.0000 mg | Freq: Four times a day (QID) | INTRAVENOUS | Status: DC | PRN
  Filled 2017-11-05: qty 5

## 2017-11-05 MED ORDER — FENTANYL CITRATE (PF) 100 MCG/2ML IJ SOLN
INTRAMUSCULAR | Status: AC
  Filled 2017-11-05: qty 2

## 2017-11-05 MED ORDER — ALUM & MAG HYDROXIDE-SIMETH 200-200-20 MG/5ML PO SUSP
30.0000 mL | Freq: Four times a day (QID) | ORAL | Status: DC | PRN
Start: 2017-11-05 — End: 2017-11-06

## 2017-11-05 MED ORDER — PHENOL 1.4 % MT LIQD: 1.0000 | OROMUCOSAL | Status: DC | PRN

## 2017-11-05 MED ORDER — OXYCODONE HCL 5 MG PO TABS: 5.0000 mg | ORAL_TABLET | Freq: Once | ORAL | Status: DC | PRN

## 2017-11-05 MED ORDER — PANTOPRAZOLE SODIUM 40 MG PO TBEC
40.0000 mg | DELAYED_RELEASE_TABLET | Freq: Every day | ORAL | Status: DC
  Administered 2017-11-05: 40 mg via ORAL
  Filled 2017-11-05: qty 1

## 2017-11-05 MED ORDER — FENTANYL CITRATE (PF) 100 MCG/2ML IJ SOLN
INTRAMUSCULAR | Status: DC | PRN
  Administered 2017-11-05: 50 ug via INTRAVENOUS
  Administered 2017-11-05: 100 ug via INTRAVENOUS
  Administered 2017-11-05 (×4): 50 ug via INTRAVENOUS

## 2017-11-05 MED ORDER — VANCOMYCIN HCL IN DEXTROSE 1-5 GM/200ML-% IV SOLN
1000.0000 mg | INTRAVENOUS | Status: AC
  Administered 2017-11-05: 1000 mg via INTRAVENOUS
  Filled 2017-11-05: qty 200

## 2017-11-05 MED ORDER — HYDROCODONE-ACETAMINOPHEN 5-325 MG PO TABS: 1.0000 | ORAL_TABLET | ORAL | Status: DC | PRN

## 2017-11-05 SURGICAL SUPPLY — 61 items
ADH SKN CLS APL DERMABOND .7 (GAUZE/BANDAGES/DRESSINGS) ×1
APL SKNCLS STERI-STRIP NONHPOA (GAUZE/BANDAGES/DRESSINGS) ×1
BAG DECANTER FOR FLEXI CONT (MISCELLANEOUS) ×3 IMPLANT
BENZOIN TINCTURE PRP APPL 2/3 (GAUZE/BANDAGES/DRESSINGS) ×3 IMPLANT
BOLT 5.0X45 SPINAL (Bolt) ×4 IMPLANT
BOLT SPNL LRG 45X5.5XPLAT NS (Screw) IMPLANT
BONE MATRIX OSTEOCEL PRO MED (Bone Implant) ×2 IMPLANT
CARTRIDGE OIL MAESTRO DRILL (MISCELLANEOUS) ×1 IMPLANT
CLOSURE WOUND 1/2 X4 (GAUZE/BANDAGES/DRESSINGS) ×1
COVER BACK TABLE 24X17X13 BIG (DRAPES) ×2 IMPLANT
DERMABOND ADVANCED (GAUZE/BANDAGES/DRESSINGS) ×2
DERMABOND ADVANCED .7 DNX12 (GAUZE/BANDAGES/DRESSINGS) ×2 IMPLANT
DIFFUSER DRILL AIR PNEUMATIC (MISCELLANEOUS) ×3 IMPLANT
DRAPE C-ARM 42X72 X-RAY (DRAPES) ×5 IMPLANT
DRAPE C-ARMOR (DRAPES) ×3 IMPLANT
DRAPE LAPAROTOMY 100X72X124 (DRAPES) ×3 IMPLANT
DRAPE POUCH INSTRU U-SHP 10X18 (DRAPES) ×3 IMPLANT
DRAPE SURG 17X23 STRL (DRAPES) ×6 IMPLANT
DRSG OPSITE POSTOP 3X4 (GAUZE/BANDAGES/DRESSINGS) ×2 IMPLANT
DRSG OPSITE POSTOP 4X6 (GAUZE/BANDAGES/DRESSINGS) ×2 IMPLANT
ELECT BLADE 6.5 EXT (BLADE) ×2 IMPLANT
ELECT REM PT RETURN 9FT ADLT (ELECTROSURGICAL) ×3
ELECTRODE REM PT RTRN 9FT ADLT (ELECTROSURGICAL) ×1 IMPLANT
GAUZE SPONGE 4X4 16PLY XRAY LF (GAUZE/BANDAGES/DRESSINGS) IMPLANT
GLOVE BIO SURGEON STRL SZ7 (GLOVE) IMPLANT
GLOVE BIO SURGEON STRL SZ8 (GLOVE) ×5 IMPLANT
GLOVE BIOGEL PI IND STRL 7.0 (GLOVE) IMPLANT
GLOVE BIOGEL PI IND STRL 7.5 (GLOVE) IMPLANT
GLOVE BIOGEL PI INDICATOR 7.0 (GLOVE) ×4
GLOVE BIOGEL PI INDICATOR 7.5 (GLOVE) ×4
GLOVE INDICATOR 8.5 STRL (GLOVE) ×5 IMPLANT
GOWN STRL REUS W/ TWL LRG LVL3 (GOWN DISPOSABLE) IMPLANT
GOWN STRL REUS W/ TWL XL LVL3 (GOWN DISPOSABLE) ×1 IMPLANT
GOWN STRL REUS W/TWL 2XL LVL3 (GOWN DISPOSABLE) ×4 IMPLANT
GOWN STRL REUS W/TWL LRG LVL3 (GOWN DISPOSABLE) ×3
GOWN STRL REUS W/TWL XL LVL3 (GOWN DISPOSABLE) ×3
HEMOSTAT POWDER KIT SURGIFOAM (HEMOSTASIS) ×2 IMPLANT
KIT BASIN OR (CUSTOM PROCEDURE TRAY) ×3 IMPLANT
KIT DILATOR XLIF 5 (KITS) ×1 IMPLANT
KIT ROOM TURNOVER OR (KITS) ×3 IMPLANT
KIT SURGICAL ACCESS MAXCESS 4 (KITS) ×2 IMPLANT
KIT XLIF (KITS) ×1
MODULE EMG NDL SSEP NVM5 (NEEDLE) IMPLANT
MODULE EMG NEEDLE SSEP NVM5 (NEEDLE) ×3 IMPLANT
MODULE NVM5 NEXT GEN EMG (NEEDLE) ×2 IMPLANT
MODULUS XLW 8X22X45MM 10 DEG (Cage) ×2 IMPLANT
NDL HYPO 25X1 1.5 SAFETY (NEEDLE) ×1 IMPLANT
NEEDLE HYPO 25X1 1.5 SAFETY (NEEDLE) ×3 IMPLANT
NS IRRIG 1000ML POUR BTL (IV SOLUTION) ×3 IMPLANT
OIL CARTRIDGE MAESTRO DRILL (MISCELLANEOUS) ×3
PACK LAMINECTOMY NEURO (CUSTOM PROCEDURE TRAY) ×3 IMPLANT
PLATE DECADE 4H SZ8 LUMBAR (Plate) ×2 IMPLANT
SCREW 45MM (Screw) ×6 IMPLANT
SPONGE LAP 4X18 X RAY DECT (DISPOSABLE) IMPLANT
STRIP CLOSURE SKIN 1/2X4 (GAUZE/BANDAGES/DRESSINGS) ×2 IMPLANT
SUT VIC AB 2-0 CT1 18 (SUTURE) ×3 IMPLANT
SUT VICRYL 4-0 PS2 18IN ABS (SUTURE) ×4 IMPLANT
TOWEL GREEN STERILE (TOWEL DISPOSABLE) ×3 IMPLANT
TOWEL GREEN STERILE FF (TOWEL DISPOSABLE) ×3 IMPLANT
TRAY FOLEY W/METER SILVER 16FR (SET/KITS/TRAYS/PACK) ×3 IMPLANT
WATER STERILE IRR 1000ML POUR (IV SOLUTION) ×3 IMPLANT

## 2017-11-05 NOTE — Progress Notes (Signed)
Pharmacy Antibiotic Note  Erica Calhoun is a 67 y.o. female admitted on 11/05/2017 with surgical prophylaxis.  Pharmacy has been consulted for vancomycin dosing. Patient received pre-op vancomycin 1g at 1004 this morning. No drain in place.  Plan: Vancomycin 1500mg  IV x1 to be given at 2130 Pharmacy to sign off  Height: 5\' 2"  (157.5 cm) Weight: 187 lb (84.8 kg) IBW/kg (Calculated) : 50.1  Temp (24hrs), Avg:97.7 F (36.5 C), Min:97.5 F (36.4 C), Max:97.8 F (36.6 C)  Recent Labs  Lab 11/01/17 0949  WBC 6.2  CREATININE 0.56    Estimated Creatinine Clearance: 69.9 mL/min (by C-G formula based on SCr of 0.56 mg/dL).    Allergies  Allergen Reactions  . Penicillins Shortness Of Breath, Swelling and Rash    Has patient had a PCN reaction causing immediate rash, facial/tongue/throat swelling, SOB or lightheadedness with hypotension:Yes Has patient had a PCN reaction causing severe rash involving mucus membranes or skin necrosis: No Has patient had a PCN reaction that required hospitalization No Has patient had a PCN reaction occurring within the last 10 years: No If all of the above answers are "NO", then may proceed with Cephalosporin use.   . Adhesive [Tape]     Blisters skin  MUST USE PAPER TAPE  . Aspirin Other (See Comments)    In high doses causes ears rings   . Codeine Other (See Comments)    headache  . Morphine Other (See Comments)    headache      Thank you for allowing pharmacy to be a part of this patient's care.  Erica Calhoun 11/05/2017 5:36 PM

## 2017-11-05 NOTE — Transfer of Care (Signed)
Immediate Anesthesia Transfer of Care Note  Patient: Erica Calhoun  Procedure(s) Performed: LUMBAR TWO-THREE ANTERIOR LATERAL LUMBAR FUSION WITH LATERAL PLATE (Left Spine Lumbar)  Patient Location: PACU  Anesthesia Type:General  Level of Consciousness: awake, alert  and oriented  Airway & Oxygen Therapy: Patient Spontanous Breathing and Patient connected to nasal cannula oxygen  Post-op Assessment: Report given to RN, Post -op Vital signs reviewed and stable and Patient moving all extremities X 4  Post vital signs: Reviewed and stable  Last Vitals:  Vitals:   11/05/17 0947  BP: (!) 164/70  Pulse: 64  Resp: 20  Temp: 36.5 C  SpO2: 97%    Last Pain:  Vitals:   11/05/17 1005  PainSc: 7       Patients Stated Pain Goal: 3 (43/88/87 5797)  Complications: No apparent anesthesia complications

## 2017-11-05 NOTE — Op Note (Signed)
Preoperative diagnosis: Degenerative scoliosis degenerative disc disease spinal stenosis and a grade 1 spondylolisthesis L2-3  Postoperative diagnosis: Same  Procedure: Anterior lateral interbody fusion L2-3 utilizing the new invasive titanium interbody cage packed with Osteocel pro utilizing intraoperative neural monitoring  #2 lateral plating utilizing the invasive 4 hole lateral plate with 366 mm fixed angle screws  Surgeon: Dominica Severin Erol Flanagin  Asst.: Kristeen Miss  Anesthesia: Gen.  EBL: Minimal  History of present illness: 67 year old female with long-standing back pain and bilateral hip and leg pain workup has revealed severe degenerative disease L2-3 as well as mild spondylosis at L5-S1. Most recent pathology was upper back radiating into her anterior medial thigh preoperative workup to include physical therapy and facet blocks and steroid injections all showed some symptomatic relief with facet blocks at L2-3. This was a level that had a significant spinal listhesis severe degenerative collapse and lateral listhesis as well. Due to patient's failure conservative treatment imaging findings and progression of clinical syndrome I recommended anterior lateral interbody fusion at L2-3. I extensively went over the risks and benefits of the operation with her as well as perioperative course expectations of outcome and alternatives of surgery and she understood and agreed to proceed forward.  Operative procedure: Patient brought into the or was induced under general anesthesia without muscle relaxation was positioned in the left lateral decubitus position left side up and bed was slightly flexed utilizing fluoroscopy I selected the entry point for 2 incisions to gain access and large director peroneal space at L2-3. And due to the 2 incision technique I guided a dilator down to would identified and fluoroscopy as the posterior one third aspect of the L2-3 disc space. Stimulated with the neuro monitor and  never achieved a rate reading less than 20. Place the Steinmann pin under fluoroscopy sequentially dilated and placed a retractor. Then under fluoroscopy position retractor appropriately opened up the both anterior and lateral arms of the retractor stimulated to 360 orientation both from the retractor as well as from direct visualization of the disc space. I then placed the shim under fluoroscopy open the retractor some more repositioned it orthogonal to the disc space. Then attention was begun with cleaning up the disc space cleaned out with due to rongeurs utilizing Cobb curettes I prepared the endplates and released the contralateral annulus. Prepared the disc space with rasps and upgoing curettes. Utilizing paddles I opened the disc space up and then with sequentially and 8 x 18 and 8 x 22 trial I selected an 8 x 22 implant. This was packed with the ostia cell and inserted under fluoroscopy. This confirmed good position of the 8 x 22 implant. This implant was also 45 mm in length. Then I selected a lateral plate had to reposition retractor and clean off some lateral osteophytes the plate with seat flush. Then under fluoroscopy cannulated the holes within all in place for 45 mm screws. Both AP and lateral fluoroscopy confirmed good position of all the implants. Wounds and cover see her get meticulous in space was maintained the wounds closed in layers with after Vicryl running 4 subcuticular in the skin and sterile dressings were applied patient recovered in stable condition. At the end the case all needle counts sponge counts were correct.

## 2017-11-05 NOTE — Anesthesia Preprocedure Evaluation (Addendum)
Anesthesia Evaluation  Patient identified by MRN, date of birth, ID band Patient awake    Reviewed: Allergy & Precautions, NPO status , Patient's Chart, lab work & pertinent test results  Airway Mallampati: III   Neck ROM: Full    Dental  (+) Dental Advisory Given, Teeth Intact   Pulmonary neg pulmonary ROS,    Pulmonary exam normal breath sounds clear to auscultation       Cardiovascular hypertension, Pt. on medications and Pt. on home beta blockers Normal cardiovascular exam+ Valvular Problems/Murmurs AS and AI  Rhythm:Regular Rate:Normal + Systolic murmurs '18 Cath - Mid LAD lesion, 10 %stenosed. Dist LAD lesion, 10 %stenosed. Mid Cx lesion, 10 %stenosed. The left ventricular systolic function is normal. LV end diastolic pressure is normal. There is mild aortic valve stenosis.  EKG - Inc RBBB  '17 TTE (care everywhere) - Mild left ventricular hypertrophy. EF is visually estimated at 55-60%. There is mild aortic sclerosis noted, with no evidence of stenosis. Trace AI by color flow doppler examination. Trace MR by color flow doppler examination.   Neuro/Psych negative neurological ROS  negative psych ROS   GI/Hepatic negative GI ROS, Neg liver ROS,   Endo/Other  Hypothyroidism Obesity; hx DM which resolved after discontinuing statins  Renal/GU negative Renal ROS  negative genitourinary   Musculoskeletal  (+) Arthritis ,   Abdominal (+) + obese,   Peds negative pediatric ROS (+)  Hematology negative hematology ROS (+)   Anesthesia Other Findings Anaphylaxis to PCN  Reproductive/Obstetrics negative OB ROS                           Anesthesia Physical  Anesthesia Plan  ASA: II  Anesthesia Plan: General   Post-op Pain Management:    Induction: Intravenous  PONV Risk Score and Plan: Treatment may vary due to age or medical condition, Ondansetron, Dexamethasone and  Midazolam  Airway Management Planned: Oral ETT  Additional Equipment: None  Intra-op Plan:   Post-operative Plan: Extubation in OR  Informed Consent: I have reviewed the patients History and Physical, chart, labs and discussed the procedure including the risks, benefits and alternatives for the proposed anesthesia with the patient or authorized representative who has indicated his/her understanding and acceptance.   Dental advisory given  Plan Discussed with: CRNA  Anesthesia Plan Comments:        Anesthesia Quick Evaluation

## 2017-11-05 NOTE — Anesthesia Procedure Notes (Signed)
Procedure Name: Intubation Date/Time: 11/05/2017 12:58 PM Performed by: Leonor Liv, CRNA Pre-anesthesia Checklist: Patient identified, Emergency Drugs available, Suction available and Patient being monitored Patient Re-evaluated:Patient Re-evaluated prior to induction Oxygen Delivery Method: Circle System Utilized Preoxygenation: Pre-oxygenation with 100% oxygen Induction Type: IV induction Ventilation: Mask ventilation without difficulty Laryngoscope Size: Mac and 3 Grade View: Grade II Tube type: Oral Tube size: 7.0 mm Number of attempts: 1 Airway Equipment and Method: Stylet and Oral airway Placement Confirmation: ETT inserted through vocal cords under direct vision,  positive ETCO2 and breath sounds checked- equal and bilateral Secured at: 21 cm Tube secured with: Tape Dental Injury: Teeth and Oropharynx as per pre-operative assessment

## 2017-11-05 NOTE — H&P (Signed)
Erica Calhoun is an 67 y.o. female.   Chief Complaint: Back and bilateral hip and leg pain HPI: 67 year old female with long-standing back and bilateral hip and leg pain workup has revealed severe degenerative collapse at L2-3 marked foraminal stenosis the L2 and L3 nerve roots from the degenerative collapse and the slight listhesis. Patient responded very well to facet blocks at L2-3 and epidural steroid injections at that level. Patient had no significant central canal stenosis most of it was all foraminal from collapse. And due to the anatomy clinical presentation failed conservative treatment I recommended anterior lateral interbody fusion at L2-3. I've extensively gone over the risks and benefits of that operation with her as well as perioperative course expectations of outcome and alternatives of surgery and she understands and agrees to proceed forward.  Past Medical History:  Diagnosis Date  . Arthritis   . Chest pain    s/p LHC 11/28/16: 10% mLAd, dLAD, mCx, EF 60%, very mild AS (peak grad 16) - cardiologist Dr. Wynonia Lawman  . Dyslipidemia   . Heart murmur    mild AS 11/2016 cath  . History of recurrent UTIs   . Hypertension   . Hypothyroidism   . Pneumonia    hx/1/17  . Ureter obstruction    right side, back in 1980's    No problems since    Past Surgical History:  Procedure Laterality Date  . ANTERIOR CERVICAL DECOMP/DISCECTOMY FUSION N/A 10/02/2016   Procedure: Anterior Cervical Diskectomy and Fusion - Cervical four- Cervical five - Cervical five-Cervical six - Cervical six -Cervical seven;  Surgeon: Kary Kos, MD;  Location: [facility];  Service: Neurosurgery;  Laterality: N/A;  . APPENDECTOMY  1978  . BUNIONECTOMY Bilateral   . CARDIAC CATHETERIZATION     2018 with Dr. Wynonia Lawman  . cyst on ovary     left ovary..  . LEFT HEART CATH AND CORONARY ANGIOGRAPHY N/A 11/28/2016   Procedure: Left Heart Cath and Coronary Angiography;  Surgeon: Troy Sine, MD;  Location: [facility];   Service: Cardiovascular;  Laterality: N/A;  . TUBAL LIGATION    . wisdon teeth      History reviewed. No pertinent family history. Social History:  reports that  has never smoked. she has never used smokeless tobacco. She reports that she does not drink alcohol or use drugs.  Allergies:  Allergies  Allergen Reactions  . Penicillins Shortness Of Breath, Swelling and Rash    Has patient had a PCN reaction causing immediate rash, facial/tongue/throat swelling, SOB or lightheadedness with hypotension:Yes Has patient had a PCN reaction causing severe rash involving mucus membranes or skin necrosis: No Has patient had a PCN reaction that required hospitalization No Has patient had a PCN reaction occurring within the last 10 years: No If all of the above answers are "NO", then may proceed with Cephalosporin use.   . Adhesive [Tape]     Blisters skin  MUST USE PAPER TAPE  . Aspirin Other (See Comments)    In high doses causes ears rings   . Codeine Other (See Comments)    headache  . Morphine Other (See Comments)    headache    Medications Prior to Admission  Medication Sig Dispense Refill  . Ascorbic Acid (VITAMIN C) 1000 MG tablet Take 1,000 mg by mouth 2 (two) times daily.    Marland Kitchen aspirin EC 81 MG tablet Take 81 mg by mouth every evening.     . Calcium Carbonate-Vitamin D (CALTRATE 600+D PO)  Take 1 tablet by mouth daily.    . carboxymethylcellulose (REFRESH PLUS) 0.5 % SOLN Apply 1-2 drops to eye 3 (three) times daily as needed (for dry eyes).    Marland Kitchen levothyroxine (SYNTHROID, LEVOTHROID) 88 MCG tablet Take 88 mcg by mouth daily before breakfast.  12  . loratadine (CLARITIN) 10 MG tablet Take 10 mg by mouth daily as needed for allergies.    Marland Kitchen losartan-hydrochlorothiazide (HYZAAR) 100-12.5 MG tablet Take 1 tablet by mouth every evening.  3  . metoprolol succinate (TOPROL-XL) 25 MG 24 hr tablet Take 25 mg by mouth daily.    . Multiple Vitamin (MULTIVITAMIN WITH MINERALS) TABS tablet Take 1  tablet by mouth daily.    . polyethylene glycol (MIRALAX / GLYCOLAX) packet Take 17 g by mouth daily.    . traMADol (ULTRAM) 50 MG tablet Take 50 mg by mouth every 6 (six) hours as needed for severe pain.     . Vitamin D, Ergocalciferol, (DRISDOL) 50000 units CAPS capsule Take 50,000 Units by mouth every Friday.    . Wheat Dextrin (BENEFIBER) POWD Take 1 Scoop by mouth daily. Mixed with miralax    . fluticasone (FLONASE) 50 MCG/ACT nasal spray Place 1 spray into both nostrils daily as needed for allergies or rhinitis.    Marland Kitchen triamcinolone cream (KENALOG) 0.1 % Apply 1 application topically daily as needed (rash).       No results found for this or any previous visit (from the past 48 hour(s)). No results found.  Review of Systems  Musculoskeletal: Positive for back pain and myalgias.  Neurological: Positive for tingling and sensory change.    Blood pressure (!) 164/70, pulse 64, temperature 97.7 F (36.5 C), resp. rate 20, height 5\' 2"  (1.575 m), weight 84.8 kg (187 lb), SpO2 97 %. Physical Exam  Constitutional: She is oriented to person, place, and time. She appears well-developed.  HENT:  Head: Normocephalic.  Eyes: Pupils are equal, round, and reactive to light.  Neck: Normal range of motion.  Respiratory: Effort normal.  GI: Soft. Bowel sounds are normal.  Neurological: She is alert and oriented to person, place, and time. She has normal strength. GCS eye subscore is 4. GCS verbal subscore is 5. GCS motor subscore is 6.  Strength is 5 of 5 iliopsoas, quads, hip she's, gastric, into tibialis, EHL.  Skin: Skin is warm and dry.     Assessment/Plan 67 year old gentleman presents for anterolateral interbody fusion L2-3.  Midori Dado P, MD 11/05/2017, 12:34 PM

## 2017-11-06 ENCOUNTER — Encounter (HOSPITAL_COMMUNITY): Payer: Self-pay | Admitting: Neurosurgery

## 2017-11-06 DIAGNOSIS — Z79890 Hormone replacement therapy (postmenopausal): Secondary | ICD-10-CM | POA: Diagnosis not present

## 2017-11-06 DIAGNOSIS — M1990 Unspecified osteoarthritis, unspecified site: Secondary | ICD-10-CM | POA: Diagnosis not present

## 2017-11-06 DIAGNOSIS — R402142 Coma scale, eyes open, spontaneous, at arrival to emergency department: Secondary | ICD-10-CM | POA: Diagnosis present

## 2017-11-06 DIAGNOSIS — E039 Hypothyroidism, unspecified: Secondary | ICD-10-CM | POA: Diagnosis not present

## 2017-11-06 DIAGNOSIS — Z981 Arthrodesis status: Secondary | ICD-10-CM | POA: Diagnosis not present

## 2017-11-06 DIAGNOSIS — I10 Essential (primary) hypertension: Secondary | ICD-10-CM | POA: Diagnosis not present

## 2017-11-06 DIAGNOSIS — Z885 Allergy status to narcotic agent status: Secondary | ICD-10-CM | POA: Diagnosis not present

## 2017-11-06 DIAGNOSIS — M4316 Spondylolisthesis, lumbar region: Secondary | ICD-10-CM | POA: Diagnosis present

## 2017-11-06 DIAGNOSIS — E669 Obesity, unspecified: Secondary | ICD-10-CM | POA: Diagnosis present

## 2017-11-06 DIAGNOSIS — R402252 Coma scale, best verbal response, oriented, at arrival to emergency department: Secondary | ICD-10-CM | POA: Diagnosis present

## 2017-11-06 DIAGNOSIS — Z7982 Long term (current) use of aspirin: Secondary | ICD-10-CM | POA: Diagnosis not present

## 2017-11-06 DIAGNOSIS — M5136 Other intervertebral disc degeneration, lumbar region: Secondary | ICD-10-CM | POA: Diagnosis present

## 2017-11-06 DIAGNOSIS — Z7951 Long term (current) use of inhaled steroids: Secondary | ICD-10-CM | POA: Diagnosis not present

## 2017-11-06 DIAGNOSIS — Z886 Allergy status to analgesic agent status: Secondary | ICD-10-CM | POA: Diagnosis not present

## 2017-11-06 DIAGNOSIS — E785 Hyperlipidemia, unspecified: Secondary | ICD-10-CM | POA: Diagnosis not present

## 2017-11-06 DIAGNOSIS — M4180 Other forms of scoliosis, site unspecified: Secondary | ICD-10-CM | POA: Diagnosis present

## 2017-11-06 DIAGNOSIS — Z91048 Other nonmedicinal substance allergy status: Secondary | ICD-10-CM | POA: Diagnosis not present

## 2017-11-06 DIAGNOSIS — M48061 Spinal stenosis, lumbar region without neurogenic claudication: Secondary | ICD-10-CM | POA: Diagnosis not present

## 2017-11-06 DIAGNOSIS — Z880 Allergy status to penicillin: Secondary | ICD-10-CM | POA: Diagnosis not present

## 2017-11-06 DIAGNOSIS — R402362 Coma scale, best motor response, obeys commands, at arrival to emergency department: Secondary | ICD-10-CM | POA: Diagnosis present

## 2017-11-06 DIAGNOSIS — Z6834 Body mass index (BMI) 34.0-34.9, adult: Secondary | ICD-10-CM | POA: Diagnosis not present

## 2017-11-06 DIAGNOSIS — M79605 Pain in left leg: Secondary | ICD-10-CM | POA: Diagnosis not present

## 2017-11-06 MED ORDER — TIZANIDINE HCL 4 MG PO TABS: 4.0000 mg | ORAL_TABLET | Freq: Two times a day (BID) | ORAL | 1 refills | Status: DC

## 2017-11-06 MED FILL — Thrombin For Soln 5000 Unit: CUTANEOUS | Qty: 5000 | Status: AC

## 2017-11-06 NOTE — Discharge Instructions (Signed)

## 2017-11-06 NOTE — Progress Notes (Signed)
Patient alert and oriented, mae's well, voiding adequate amount of urine, swallowing without difficulty,  c/o mild pain at time of discharge. Patient discharged home with family. Script and discharged instructions given to patient. Patient and family stated understanding of instructions given. Patient has an appointment with Dr. Cram   

## 2017-11-06 NOTE — Evaluation (Signed)
Physical Therapy Evaluation and Discharge  Patient Details Name: Erica Calhoun MRN: [id number] DOB: Aug 01, 1951 Today's Date: 11/06/2017   History of Present Illness  Pt is a 67 y.o. female s/p ALIF L2-3. PMH significant for arthritis, HTN, and heart murmurs.   Clinical Impression  Patient evaluated by Physical Therapy with no further acute PT needs identified. All education has been completed and the patient has no further questions. At the time of PT eval pt was able to mobilize with gross modified independence without AD. Pt was educated on precautions, stair negotiation, and general safety with activity progression. See below for any follow-up Physical Therapy or equipment needs. PT is signing off. Thank you for this referral.     Follow Up Recommendations No PT follow up;Supervision - Intermittent    Equipment Recommendations  None recommended by PT    Recommendations for Other Services       Precautions / Restrictions Precautions Precautions: Back Precaution Booklet Issued: Yes (comment) Precaution Comments: Patient verbalizes back precautions of no bending. lifting. or twisting. Required Braces or Orthoses: Spinal Brace Spinal Brace: Lumbar corset Restrictions Weight Bearing Restrictions: No      Mobility  Bed Mobility Overal bed mobility: Modified Independent Bed Mobility: Sit to Supine;Supine to Sit     Supine to sit: Modified independent (Device/Increase time) Sit to supine: Modified independent (Device/Increase time)   General bed mobility comments: Patient was modified independent with bed mobility and minimal VC required for back precautions and use of handrails for UE assistance.   Transfers Overall transfer level: Modified independent Equipment used: None             General transfer comment: Patient able to boost from bed to standing with proper use of UE and trunk control. Precautions were maintained and patient was very aware not to break  them  Ambulation/Gait Ambulation/Gait assistance: Modified independent (Device/Increase time) Ambulation Distance (Feet): 350 Feet Assistive device: None Gait Pattern/deviations: Step-through pattern;Narrow base of support;Decreased stride length Gait velocity: decreased Gait velocity interpretation: Below normal speed for age/gender General Gait Details: Patient able to demonstrate upright posture but with a narrow base of support during ambulation. VC required to maintain a wider stance during gait as slight swaying was present, however no assistance was required and no LOB noted.   Stairs Stairs: Yes Stairs assistance: Min guard Stair Management: One rail Right;One rail Left;Forwards Number of Stairs: 10 General stair comments: Patient educated on sequencing and general safety with stair navigation and seems very comfortable with going up/down with use of handrails.   Wheelchair Mobility    Modified Rankin (Stroke Patients Only)       Balance Overall balance assessment: Needs assistance Sitting-balance support: No upper extremity supported;Feet supported Sitting balance-Leahy Scale: Good Sitting balance - Comments: Patient demonstartes good sitting balance with no use of UE. Trunk is able to maintain midline with no swaying present.    Standing balance support: No upper extremity supported Standing balance-Leahy Scale: Good Standing balance comment: Patient demonstrates upright posture and no use of UE for standing balance.                              Pertinent Vitals/Pain Pain Assessment: 0-10 Pain Score: 6  Pain Location: Back  Pain Descriptors / Indicators: Aching;Operative site guarding Pain Intervention(s): Monitored during session    Home Living Family/patient expects to be discharged to:: Private residence Living Arrangements: Spouse/significant other Available Help at Discharge:  Family Type of Home: Mobile home Home Access: Stairs to  enter Entrance Stairs-Rails: None Entrance Stairs-Number of Steps: 4 Home Layout: One level Home Equipment: None      Prior Function Level of Independence: Independent               Hand Dominance        Extremity/Trunk Assessment   Upper Extremity Assessment Upper Extremity Assessment: Defer to OT evaluation    Lower Extremity Assessment Lower Extremity Assessment: Overall WFL for tasks assessed    Cervical / Trunk Assessment Cervical / Trunk Assessment: Normal  Communication   Communication: No difficulties  Cognition Arousal/Alertness: Awake/alert Behavior During Therapy: WFL for tasks assessed/performed Overall Cognitive Status: Within Functional Limits for tasks assessed                                        General Comments      Exercises     Assessment/Plan    PT Assessment Patent does not need any further PT services  PT Problem List         PT Treatment Interventions      PT Goals (Current goals can be found in the Care Plan section)  Acute Rehab PT Goals Patient Stated Goal: Home today PT Goal Formulation: All assessment and education complete, DC therapy    Frequency     Barriers to discharge        Co-evaluation               AM-PAC PT "6 Clicks" Daily Activity  Outcome Measure Difficulty turning over in bed (including adjusting bedclothes, sheets and blankets)?: None Difficulty moving from lying on back to sitting on the side of the bed? : None Difficulty sitting down on and standing up from a chair with arms (e.g., wheelchair, bedside commode, etc,.)?: None Help needed moving to and from a bed to chair (including a wheelchair)?: None Help needed walking in hospital room?: None Help needed climbing 3-5 steps with a railing? : A Little 6 Click Score: 23    End of Session Equipment Utilized During Treatment: Gait belt;Back brace Activity Tolerance: Patient tolerated treatment well;No increased  pain Patient left: in bed;with call bell/phone within reach Nurse Communication: Mobility status PT Visit Diagnosis: Unsteadiness on feet (R26.81);Pain Pain - part of body: (back)    Time: 5409-8119 PT Time Calculation (min) (ACUTE ONLY): 9 min   Charges:   PT Evaluation $PT Eval Low Complexity: 1 Low     PT G Codes:        Rolinda Roan, PT, DPT Acute Rehabilitation Services Pager: [phone]   Thelma Comp 11/06/2017, 8:44 AM

## 2017-11-06 NOTE — Evaluation (Signed)
Occupational Therapy Evaluation Patient Details Name: Erica Calhoun MRN: [id number] DOB: 25-Jul-1951 Today's Date: 11/06/2017    History of Present Illness Pt is a 67 y.o. female s/p ALIF L2-3. PMH significant for arthritis, HTN, and heart murmurs.    Clinical Impression   This 67 y/o F presents with the above. At baseline pt is independent with ADLs and functional mobility. Pt completing room level functional mobility with supervision this session; currently requires [place] for LB ADLs secondary to adhering to back precautions. Education provided on AE, safety and compensatory techniques for completing ADLs while adhering to precautions with pt verbalizing understanding. Pt reports will be returning home with spouse who is able to assist with ADLs PRN. Questions answered throughout. No further acute OT needs identified at this time. Will sign off.     Follow Up Recommendations  No OT follow up;Supervision/Assistance - 24 hour(24 hr initially )    Equipment Recommendations  None recommended by OT           Precautions / Restrictions Precautions Precautions: Back Precaution Booklet Issued: Yes (comment) Precaution Comments: Patient verbalizes back precautions of no bending. lifting. or twisting. Required Braces or Orthoses: Spinal Brace Spinal Brace: Lumbar corset;Applied in sitting position Restrictions Weight Bearing Restrictions: No      Mobility Bed Mobility               General bed mobility comments: OOB in bathroom upon entering room   Transfers Overall transfer level: Modified independent Equipment used: None             General transfer comment: pt able to sit<>stand from recliner without physical assist and good maintenance of back precautions     Balance Overall balance assessment: Needs assistance Sitting-balance support: No upper extremity supported;Feet supported Sitting balance-Leahy Scale: Good     Standing balance support: No upper  extremity supported Standing balance-Leahy Scale: Good Standing balance comment: Patient demonstrates upright posture and no use of UE for standing balance.                            ADL either performed or assessed with clinical judgement   ADL Overall ADL's : Needs assistance/impaired Eating/Feeding: Modified independent;Sitting   Grooming: Supervision/safety;Standing   Upper Body Bathing: Supervision/ safety;Standing   Lower Body Bathing: Min guard;Sit to/from stand;Cueing for back precautions   Upper Body Dressing : Sitting;Set up   Lower Body Dressing: Sit to/from stand;Min guard Lower Body Dressing Details (indicate cue type and reason): pt able to complete figure 4 technique without difficulty and without pulling/increased pain at incision site; pt reports is familiar with technique for using reacher to complete LB dressing, declined practicing/demonstration this session  Toilet Transfer: Supervision/safety;Ambulation;Regular Museum/gallery exhibitions officer and Hygiene: Min guard;Sit to/from Nurse, children's Details (indicate cue type and reason): educated on use of AE and/or use of shower chair during task completion for increased safety/ease of adhering to back precautions with pt verbalizing understanding; educated to complete task while spouse is at home for increased safety in case of need for assist  Functional mobility during ADLs: Supervision/safety General ADL Comments: educated on use of AE, safety and compensatory techniques for completing ADLs and functional transfers while adhering to back precautions with pt verbalizing understanding                          Pertinent Vitals/Pain  Pain Assessment: Faces Faces Pain Scale: Hurts a little bit Pain Location: Back  Pain Descriptors / Indicators: Aching;Operative site guarding Pain Intervention(s): Monitored during session     Hand Dominance     Extremity/Trunk  Assessment Upper Extremity Assessment Upper Extremity Assessment: Overall WFL for tasks assessed   Lower Extremity Assessment Lower Extremity Assessment: Defer to PT evaluation   Cervical / Trunk Assessment Cervical / Trunk Assessment: Normal   Communication Communication Communication: No difficulties   Cognition Arousal/Alertness: Awake/alert Behavior During Therapy: WFL for tasks assessed/performed Overall Cognitive Status: Within Functional Limits for tasks assessed                                                      Home Living Family/patient expects to be discharged to:: Private residence Living Arrangements: Spouse/significant other Available Help at Discharge: Family Type of Home: Mobile home Home Access: Stairs to enter Technical brewer of Steps: 4 Entrance Stairs-Rails: None Home Layout: One level     Bathroom Shower/Tub: Occupational psychologist: Standard Bathroom Accessibility: Yes   Home Equipment: None          Prior Functioning/Environment Level of Independence: Independent                 OT Problem List: Decreased knowledge of precautions;Decreased knowledge of use of DME or AE;Decreased activity tolerance            OT Goals(Current goals can be found in the care plan section) Acute Rehab OT Goals Patient Stated Goal: Home today OT Goal Formulation: All assessment and education complete, DC therapy                                 AM-PAC PT "6 Clicks" Daily Activity     Outcome Measure Help from another person eating meals?: None Help from another person taking care of personal grooming?: None Help from another person toileting, which includes using toliet, bedpan, or urinal?: None Help from another person bathing (including washing, rinsing, drying)?: A Little Help from another person to put on and taking off regular upper body clothing?: None Help from another person to put on and  taking off regular lower body clothing?: A Little 6 Click Score: 22   End of Session Equipment Utilized During Treatment: Back brace Nurse Communication: Mobility status  Activity Tolerance: Patient tolerated treatment well Patient left: in chair;with call bell/phone within reach  OT Visit Diagnosis: Other abnormalities of gait and mobility (R26.89)                Time: 6010-9323 OT Time Calculation (min): 14 min Charges:  OT General Charges $OT Visit: 1 Visit OT Evaluation $OT Eval Low Complexity: 1 Low G-Codes:     Lou Cal, OT Pager [phone] 11/06/2017  Raymondo Band 11/06/2017, 2:04 PM

## 2017-11-06 NOTE — Discharge Summary (Signed)
Physician Discharge Summary  Patient ID: AMSI GRIMLEY MRN: [id number] DOB/AGE: 1951/08/01 67 y.o. Estimated body mass index is 34.2 kg/m as calculated from the following:   Height as of this encounter: 5\' 2"  (1.575 m).   Weight as of this encounter: 84.8 kg (187 lb).   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  Consults: Significant Diagnostic Studies: Treatments:  XLIF L2-3 Discharge Exam: Blood pressure 114/60, pulse 61, temperature 99.4 F (37.4 C), resp. rate 16, height 5\' 2"  (1.575 m), weight 84.8 kg (187 lb), SpO2 100 %.  strength 5/5 wounds clean dry and intact  Disposition:  home  Discharge Instructions    Incentive spirometry RT   Complete by:  As directed      Allergies as of 11/06/2017      Reactions   Penicillins Shortness Of Breath, Swelling, Rash   Has patient had a PCN reaction causing immediate rash, facial/tongue/throat swelling, SOB or lightheadedness with hypotension:Yes Has patient had a PCN reaction causing severe rash involving mucus membranes or skin necrosis: No Has patient had a PCN reaction that required hospitalization No Has patient had a PCN reaction occurring within the last 10 years: No If all of the above answers are "NO", then may proceed with Cephalosporin use.   Adhesive [tape]    Blisters skin  MUST USE PAPER TAPE   Aspirin Other (See Comments)   In high doses causes ears rings   Codeine Other  (See Comments)   headache   Morphine Other (See Comments)   headache      Medication List    TAKE these medications   aspirin EC 81 MG tablet Take 81 mg by mouth every evening.   BENEFIBER Powd Take 1 Scoop by mouth daily. Mixed with miralax   CALTRATE 600+D PO Take 1 tablet by mouth daily.   carboxymethylcellulose 0.5 % Soln Commonly known as:  REFRESH PLUS Apply 1-2 drops to eye 3 (three) times daily as needed (for dry eyes).   fluticasone 50 MCG/ACT nasal spray Commonly known as:  FLONASE Place 1 spray into both nostrils daily as needed for allergies or rhinitis.   levothyroxine 88 MCG tablet Commonly known as:  SYNTHROID, LEVOTHROID Take 88 mcg by mouth daily before breakfast.   loratadine 10 MG tablet Commonly known as:  CLARITIN Take 10 mg by mouth daily as needed for allergies.   losartan-hydrochlorothiazide 100-12.5 MG tablet Commonly known as:  HYZAAR Take 1 tablet by mouth every evening.   metoprolol succinate 25 MG 24 hr tablet Commonly known as:  TOPROL-XL Take 25 mg by mouth daily.   multivitamin with minerals Tabs tablet Take 1 tablet by mouth daily.   polyethylene glycol packet Commonly known as:  MIRALAX / GLYCOLAX Take 17 g by mouth daily.   tiZANidine 4 MG tablet Commonly known as:  ZANAFLEX Take 1 tablet (4 mg total) by mouth 2 (two) times daily.   traMADol 50 MG tablet Commonly known as:  ULTRAM Take 50 mg by mouth every  6 (six) hours as needed for severe pain.   triamcinolone cream 0.1 % Commonly known as:  KENALOG Apply 1 application topically daily as needed (rash).   vitamin C 1000 MG tablet Take 1,000 mg by mouth 2 (two) times daily.   Vitamin D (Ergocalciferol) 50000 units Caps capsule Commonly known as:  DRISDOL Take 50,000 Units by mouth every Friday.        Signed: Emrey Thornley P 11/06/2017, 8:23 AM

## 2017-11-06 NOTE — Anesthesia Postprocedure Evaluation (Signed)
Anesthesia Post Note  Patient: LACEY WALLMAN  Procedure(s) Performed: LUMBAR TWO-THREE ANTERIOR LATERAL LUMBAR FUSION WITH LATERAL PLATE (Left Spine Lumbar)     Patient location during evaluation: PACU Anesthesia Type: General Level of consciousness: awake and alert Pain management: pain level controlled Vital Signs Assessment: post-procedure vital signs reviewed and stable Respiratory status: spontaneous breathing, nonlabored ventilation, respiratory function stable and patient connected to nasal cannula oxygen Cardiovascular status: blood pressure returned to baseline and stable Postop Assessment: no apparent nausea or vomiting Anesthetic complications: no    Last Vitals:  Vitals:   11/05/17 2305 11/06/17 0409  BP: (!) 146/68 (!) 137/58  Pulse: 64 (!) 55  Resp: 16 16  Temp: (!) 36.3 C 36.6 C  SpO2: 99% 96%                  Effie Berkshire

## 2017-12-10 DIAGNOSIS — D3911 Neoplasm of uncertain behavior of right ovary: Secondary | ICD-10-CM | POA: Diagnosis not present

## 2017-12-10 DIAGNOSIS — N9489 Other specified conditions associated with female genital organs and menstrual cycle: Secondary | ICD-10-CM | POA: Diagnosis not present

## 2017-12-10 DIAGNOSIS — D251 Intramural leiomyoma of uterus: Secondary | ICD-10-CM | POA: Diagnosis not present

## 2017-12-11 DIAGNOSIS — M5440 Lumbago with sciatica, unspecified side: Secondary | ICD-10-CM | POA: Diagnosis not present

## 2017-12-11 DIAGNOSIS — M47816 Spondylosis without myelopathy or radiculopathy, lumbar region: Secondary | ICD-10-CM | POA: Diagnosis not present

## 2017-12-11 DIAGNOSIS — M47817 Spondylosis without myelopathy or radiculopathy, lumbosacral region: Secondary | ICD-10-CM | POA: Diagnosis not present

## 2017-12-14 DIAGNOSIS — E039 Hypothyroidism, unspecified: Secondary | ICD-10-CM | POA: Diagnosis not present

## 2017-12-14 DIAGNOSIS — I350 Nonrheumatic aortic (valve) stenosis: Secondary | ICD-10-CM | POA: Diagnosis not present

## 2017-12-14 DIAGNOSIS — E668 Other obesity: Secondary | ICD-10-CM | POA: Diagnosis not present

## 2017-12-14 DIAGNOSIS — I119 Hypertensive heart disease without heart failure: Secondary | ICD-10-CM | POA: Diagnosis not present

## 2018-01-10 NOTE — Patient Instructions (Addendum)
Your procedure is scheduled on: Wednesday Jan 23, 2018 at 7:30 am  Enter through the Main Entrance of [facility] at: 6:00 am  Pick up the phone at the desk and dial [phone].  Call this number if you have problems the morning of surgery: [phone].  Remember: Do NOT eat food or drink any liquids after: Midnight on Tuesday May 21  Take these medicines the morning of surgery with a SIP OF WATER: Metoprolol, levothyroxine, refresh eye drops.  May take Flonase spray, loratadine, triamcinolone cream   STOP ALL VITAMINS, HERBAL MEDICATIONS, SUPPLEMENTS NOW  DO NOT SMOKE DAY OF SURGERY  Do NOT wear jewelry (body piercing), metal hair clips/bobby pins, make-up, or nail polish. Do NOT wear lotions, powders, or perfumes.  You may wear deoderant. Do NOT shave for 48 hours prior to surgery. Do NOT bring valuables to the hospital. Contacts, dentures, or bridgework may not be worn into surgery. Leave suitcase in car.  After surgery it may be brought to your room.    For patients admitted to the hospital, checkout time is 11:00 AM the day of discharge.  If discharged day of surgery will need a responsible adult to drive you home and stay with you for 23 hours

## 2018-01-14 ENCOUNTER — Encounter (HOSPITAL_COMMUNITY)
Admission: RE | Admit: 2018-01-14 | Discharge: 2018-01-14 | Disposition: A | Discharge status: Home / Self Care | Payer: Medicare Other | Source: Ambulatory Visit | Attending: Obstetrics and Gynecology | Admitting: Obstetrics and Gynecology

## 2018-01-14 ENCOUNTER — Other Ambulatory Visit: Payer: Self-pay

## 2018-01-14 ENCOUNTER — Encounter (HOSPITAL_COMMUNITY): Payer: Self-pay

## 2018-01-14 DIAGNOSIS — Z01812 Encounter for preprocedural laboratory examination: Secondary | ICD-10-CM | POA: Insufficient documentation

## 2018-01-14 HISTORY — DX: Fibromyalgia: M79.7

## 2018-01-14 HISTORY — DX: Endocarditis, valve unspecified: I38

## 2018-01-14 LAB — COMPREHENSIVE METABOLIC PANEL
ALBUMIN: 4.3 g/dL (ref 3.5–5.0)
ALT: 21 U/L (ref 14–54)
AST: 21 U/L (ref 15–41)
Alkaline Phosphatase: 78 U/L (ref 38–126)
Anion gap: 12 (ref 5–15)
BUN: 15 mg/dL (ref 6–20)
CALCIUM: 9.5 mg/dL (ref 8.9–10.3)
CO2: 26 mmol/L (ref 22–32)
CREATININE: 0.54 mg/dL (ref 0.44–1.00)
Chloride: 100 mmol/L — ABNORMAL LOW (ref 101–111)
GFR calc non Af Amer: >60 mL/min (ref >60)
GLUCOSE: 128 mg/dL — AB (ref 65–99)
Potassium: 3.8 mmol/L (ref 3.5–5.1)
Sodium: 138 mmol/L (ref 135–145)
TOTAL PROTEIN: 7 g/dL (ref 6.5–8.1)
Total Bilirubin: 0.6 mg/dL (ref 0.3–1.2)

## 2018-01-14 LAB — CBC
HEMATOCRIT: 43.3 % (ref 36.0–46.0)
Hemoglobin: 14.7 g/dL (ref 12.0–15.0)
MCH: 29.9 pg (ref 26.0–34.0)
MCHC: 33.9 g/dL (ref 30.0–36.0)
MCV: 88.2 fL (ref 78.0–100.0)
PLATELETS: 164 10*3/uL (ref 150–400)
RBC: 4.91 MIL/uL (ref 3.87–5.11)
RDW: 12.4 % (ref 11.5–15.5)
WBC: 5.1 10*3/uL (ref 4.0–10.5)

## 2018-01-14 LAB — TYPE AND SCREEN
ABO/RH(D): O NEG
ANTIBODY SCREEN: NEGATIVE

## 2018-01-14 NOTE — Pre-Procedure Instructions (Signed)
Dr. Frankey Shown viewed and okay'd ekg

## 2018-01-22 DIAGNOSIS — M47816 Spondylosis without myelopathy or radiculopathy, lumbar region: Secondary | ICD-10-CM | POA: Diagnosis not present

## 2018-01-22 DIAGNOSIS — M47817 Spondylosis without myelopathy or radiculopathy, lumbosacral region: Secondary | ICD-10-CM | POA: Diagnosis not present

## 2018-01-22 DIAGNOSIS — R1909 Other intra-abdominal and pelvic swelling, mass and lump: Secondary | ICD-10-CM | POA: Diagnosis not present

## 2018-01-22 DIAGNOSIS — D259 Leiomyoma of uterus, unspecified: Secondary | ICD-10-CM | POA: Diagnosis not present

## 2018-01-22 DIAGNOSIS — N859 Noninflammatory disorder of uterus, unspecified: Secondary | ICD-10-CM | POA: Diagnosis not present

## 2018-01-22 NOTE — H&P (Signed)
Erica Calhoun is an 67 y.o. female [phone] who presents for LAVH/bilateral salpingectomy for a probable intrauterine polyp, and ovarian mass.   Pt had an MRI of her back which revealed uterine masses so she was referred for evaluation.  She had no bleeding or symptoms.  EMB benign and an ovarian hypoechoic mass noted on [place] with calcification 1.6 cm. Tumor markers negative. FHx of breast cancer in 2 sisters. She had a recent back surgery with a cage placed in 3/19  Pertinent Gynecological History:  Last pap: WNL 1/19 except atrophy OB History:  NSVD x 3 (set of twins) 8+lbs  Menstrual History:  No LMP recorded. Patient is postmenopausal.    Past Medical History:  Diagnosis Date  . Arthritis    cervical and lower back   . Chest pain    s/p LHC 11/28/16: 10% mLAd, dLAD, mCx, EF 60%, very mild AS (peak grad 16) - cardiologist Dr. Wynonia Lawman  . Dyslipidemia   . Fibromyalgia   . Heart murmur    mild AS 11/2016 cath  . History of recurrent UTIs   . Hypertension   . Hypothyroidism   . Leaky heart valve    recent visit with cardiologist, okay'd for surgery  . Pneumonia    hx/1/17  . Ureter obstruction    right side, back in 1980's    No problems since    Past Surgical History:  Procedure Laterality Date  . ANTERIOR CERVICAL DECOMP/DISCECTOMY FUSION N/A 10/02/2016   Procedure: Anterior Cervical Diskectomy and Fusion - Cervical four- Cervical five - Cervical five-Cervical six - Cervical six -Cervical seven;  Surgeon: Kary Kos, MD;  Location: [facility];  Service: Neurosurgery;  Laterality: N/A;  . ANTERIOR LAT LUMBAR FUSION Left 11/05/2017   Procedure: LUMBAR TWO-THREE ANTERIOR LATERAL LUMBAR FUSION WITH LATERAL PLATE;  Surgeon: Kary Kos, MD;  Location: [place] [facility];  Service: Neurosurgery;  Laterality: Left;  . APPENDECTOMY  1978  . BUNIONECTOMY Bilateral   . CARDIAC CATHETERIZATION     2018 with Dr. Wynonia Lawman  . cyst on ovary     left ovary..  . LEFT HEART CATH AND CORONARY ANGIOGRAPHY N/A  11/28/2016   Procedure: Left Heart Cath and Coronary Angiography;  Surgeon: Troy Sine, MD;  Location: [facility];  Service: Cardiovascular;  Laterality: N/A;  . TUBAL LIGATION    . wisdon teeth      No family history on file.  Social History:  reports that she has never smoked. She has never used smokeless tobacco. She reports that she does not drink alcohol or use drugs.  Allergies:  Allergies  Allergen Reactions  . Penicillins Shortness Of Breath, Swelling and Rash    Has patient had a PCN reaction causing immediate rash, facial/tongue/throat swelling, SOB or lightheadedness with hypotension:Yes Has patient had a PCN reaction causing severe rash involving mucus membranes or skin necrosis: No Has patient had a PCN reaction that required hospitalization No Has patient had a PCN reaction occurring within the last 10 years: No If all of the above answers are "NO", then may proceed with Cephalosporin use.   . Adhesive [Tape]     Blisters skin  MUST USE PAPER TAPE  . Aspirin Other (See Comments)    In high doses causes ears rings   . Codeine Other (See Comments)    headache  . Morphine Other (See Comments)    headache    No medications prior to admission.    Review of Systems  Gastrointestinal:  Negative for abdominal pain.  Genitourinary: Negative for hematuria.  Musculoskeletal: Negative for neck pain.    There were no vitals taken for this visit. Physical Exam  Constitutional: She is oriented to person, place, and time. She appears well-developed and well-nourished.  Cardiovascular: Normal rate and regular rhythm.  Respiratory: Effort normal.  GI: Soft.  Genitourinary: Vagina normal and uterus normal.  Genitourinary Comments: Good descensus  Neurological: She is alert and oriented to person, place, and time.  Psychiatric: She has a normal mood and affect.    No results found for this or any previous visit (from the past 24 hour(s)).  No results  found.  Assessment/Plan: The patient was offered polypectomy/hysteroscopy and f/u of ovarian mass with [place].  She really wanted to proceed with definitive surgical treatment and removal of ovaries and uterus.  The patient was counseled regarding the risks of laparoscopic assisted vaginal hysterectomy, The procedure was reviewed in detail and expectations regarding recovery. Risks of bleeding, infection and possible damage to bowel, ureters and bladder were reviewed. The patient understands that should a complication arise she would likely need a larger abdominal incision and that this would delay her recovery and that sometimes complications are not identified until after discharge. She would accept a blood transfusion if needed. We are planning removal of the ovary with the calcification. We also discussed removal of the fallopian tubes and both ovaries as a means of possibly reducing future risk of ovarian cancer and she is agreeable to this, especially given her family history. She is ready to proceed.  Logan Bores 01/22/2018, 4:32 PM

## 2018-01-22 NOTE — Anesthesia Preprocedure Evaluation (Addendum)
Anesthesia Evaluation  Patient identified by MRN, date of birth, ID band Patient awake    Reviewed: Allergy & Precautions, NPO status , Patient's Chart, lab work & pertinent test results, reviewed documented beta blocker date and time   Airway Mallampati: III   Neck ROM: Full    Dental  (+) Dental Advisory Given, Teeth Intact   Pulmonary neg pulmonary ROS,    Pulmonary exam normal breath sounds clear to auscultation       Cardiovascular hypertension, Pt. on medications and Pt. on home beta blockers Normal cardiovascular exam+ Valvular Problems/Murmurs AS and AI  Rhythm:Regular Rate:Normal + Systolic murmurs '18 Cath - Mid LAD lesion, 10 %stenosed. Dist LAD lesion, 10 %stenosed. Mid Cx lesion, 10 %stenosed. The left ventricular systolic function is normal. LV end diastolic pressure is normal. There is mild aortic valve stenosis.  EKG - Inc RBBB  '17 TTE (care everywhere) - Mild left ventricular hypertrophy. EF is visually estimated at 55-60%. There is mild aortic sclerosis noted, with no evidence of stenosis. Trace AI by color flow doppler examination. Trace MR by color flow doppler examination.   Neuro/Psych negative neurological ROS  negative psych ROS   GI/Hepatic negative GI ROS, Neg liver ROS,   Endo/Other  Hypothyroidism Obesity; hx DM which resolved after discontinuing statins  Renal/GU negative Renal ROS  negative genitourinary   Musculoskeletal  (+) Arthritis ,   Abdominal (+) + obese,   Peds negative pediatric ROS (+)  Hematology negative hematology ROS (+)   Anesthesia Other Findings Anaphylaxis to PCN  Reproductive/Obstetrics negative OB ROS                            Lab Results  Component Value Date   WBC 5.1 01/14/2018   HGB 14.7 01/14/2018   HCT 43.3 01/14/2018   MCV 88.2 01/14/2018   PLT 164 01/14/2018   Lab Results  Component Value Date   CREATININE 0.54  01/14/2018   BUN 15 01/14/2018   NA 138 01/14/2018   K 3.8 01/14/2018   CL 100 (L) 01/14/2018   CO2 26 01/14/2018    Anesthesia Physical  Anesthesia Plan  ASA: II  Anesthesia Plan: General   Post-op Pain Management:    Induction: Intravenous  PONV Risk Score and Plan: 4 or greater and Treatment may vary due to age or medical condition, Ondansetron, Dexamethasone, Midazolam and Scopolamine patch - Pre-op  Airway Management Planned: Oral ETT  Additional Equipment: None  Intra-op Plan:   Post-operative Plan: Extubation in OR  Informed Consent: I have reviewed the patients History and Physical, chart, labs and discussed the procedure including the risks, benefits and alternatives for the proposed anesthesia with the patient or authorized representative who has indicated his/her understanding and acceptance.   Dental advisory given  Plan Discussed with: CRNA  Anesthesia Plan Comments:        Anesthesia Quick Evaluation

## 2018-01-23 ENCOUNTER — Encounter (HOSPITAL_COMMUNITY): Payer: Self-pay | Admitting: Certified Registered Nurse Anesthetist

## 2018-01-23 ENCOUNTER — Ambulatory Visit (HOSPITAL_COMMUNITY): Payer: Medicare Other | Admitting: Anesthesiology

## 2018-01-23 ENCOUNTER — Encounter (HOSPITAL_COMMUNITY)
Admission: AD | Disposition: A | Discharge status: Home / Self Care | Payer: Self-pay | Source: Ambulatory Visit | Attending: Obstetrics and Gynecology

## 2018-01-23 ENCOUNTER — Ambulatory Visit (HOSPITAL_COMMUNITY)
Admission: AD | Admit: 2018-01-23 | Discharge: 2018-01-24 | Disposition: A | Discharge status: Home / Self Care | Payer: Medicare Other | Source: Ambulatory Visit | Attending: Obstetrics and Gynecology | Admitting: Obstetrics and Gynecology

## 2018-01-23 ENCOUNTER — Other Ambulatory Visit: Payer: Self-pay

## 2018-01-23 DIAGNOSIS — Z90710 Acquired absence of both cervix and uterus: Secondary | ICD-10-CM | POA: Diagnosis present

## 2018-01-23 DIAGNOSIS — D270 Benign neoplasm of right ovary: Secondary | ICD-10-CM | POA: Diagnosis not present

## 2018-01-23 DIAGNOSIS — D259 Leiomyoma of uterus, unspecified: Secondary | ICD-10-CM | POA: Diagnosis not present

## 2018-01-23 DIAGNOSIS — Z885 Allergy status to narcotic agent status: Secondary | ICD-10-CM | POA: Diagnosis not present

## 2018-01-23 DIAGNOSIS — Z886 Allergy status to analgesic agent status: Secondary | ICD-10-CM | POA: Diagnosis not present

## 2018-01-23 DIAGNOSIS — M797 Fibromyalgia: Secondary | ICD-10-CM | POA: Diagnosis not present

## 2018-01-23 DIAGNOSIS — N838 Other noninflammatory disorders of ovary, fallopian tube and broad ligament: Secondary | ICD-10-CM | POA: Diagnosis not present

## 2018-01-23 DIAGNOSIS — E039 Hypothyroidism, unspecified: Secondary | ICD-10-CM | POA: Insufficient documentation

## 2018-01-23 DIAGNOSIS — E785 Hyperlipidemia, unspecified: Secondary | ICD-10-CM | POA: Insufficient documentation

## 2018-01-23 DIAGNOSIS — Z888 Allergy status to other drugs, medicaments and biological substances status: Secondary | ICD-10-CM | POA: Insufficient documentation

## 2018-01-23 DIAGNOSIS — N939 Abnormal uterine and vaginal bleeding, unspecified: Secondary | ICD-10-CM | POA: Diagnosis not present

## 2018-01-23 DIAGNOSIS — I10 Essential (primary) hypertension: Secondary | ICD-10-CM | POA: Diagnosis not present

## 2018-01-23 DIAGNOSIS — N839 Noninflammatory disorder of ovary, fallopian tube and broad ligament, unspecified: Secondary | ICD-10-CM | POA: Insufficient documentation

## 2018-01-23 DIAGNOSIS — N888 Other specified noninflammatory disorders of cervix uteri: Secondary | ICD-10-CM | POA: Insufficient documentation

## 2018-01-23 DIAGNOSIS — N840 Polyp of corpus uteri: Secondary | ICD-10-CM | POA: Diagnosis not present

## 2018-01-23 DIAGNOSIS — Z79899 Other long term (current) drug therapy: Secondary | ICD-10-CM | POA: Insufficient documentation

## 2018-01-23 DIAGNOSIS — Z880 Allergy status to penicillin: Secondary | ICD-10-CM | POA: Insufficient documentation

## 2018-01-23 HISTORY — PX: LAPAROSCOPIC VAGINAL HYSTERECTOMY WITH SALPINGO OOPHORECTOMY: SHX6681

## 2018-01-23 HISTORY — PX: CYSTOSCOPY: SHX5120

## 2018-01-23 LAB — TYPE AND SCREEN
ABO/RH(D): O NEG
ANTIBODY SCREEN: NEGATIVE

## 2018-01-23 SURGERY — HYSTERECTOMY, VAGINAL, LAPAROSCOPY-ASSISTED, WITH SALPINGO-OOPHORECTOMY
Anesthesia: General | Site: Urethra

## 2018-01-23 MED ORDER — MIDAZOLAM HCL 2 MG/2ML IJ SOLN
INTRAMUSCULAR | Status: DC | PRN
  Administered 2018-01-23: 1 mg via INTRAVENOUS

## 2018-01-23 MED ORDER — SODIUM CHLORIDE 0.9 % IJ SOLN
INTRAMUSCULAR | Status: AC
  Filled 2018-01-23: qty 10

## 2018-01-23 MED ORDER — LACTATED RINGERS IV SOLN
INTRAVENOUS | Status: DC
  Administered 2018-01-23: 1000 mL via INTRAVENOUS

## 2018-01-23 MED ORDER — GENTAMICIN SULFATE 40 MG/ML IJ SOLN
INTRAVENOUS | Status: AC
  Administered 2018-01-23: 115 mL via INTRAVENOUS
  Filled 2018-01-23: qty 8.25

## 2018-01-23 MED ORDER — PROPOFOL 10 MG/ML IV BOLUS
INTRAVENOUS | Status: DC | PRN
  Administered 2018-01-23: 150 mg via INTRAVENOUS

## 2018-01-23 MED ORDER — SIMETHICONE 80 MG PO CHEW: 80.0000 mg | CHEWABLE_TABLET | Freq: Four times a day (QID) | ORAL | Status: DC | PRN

## 2018-01-23 MED ORDER — ROCURONIUM BROMIDE 100 MG/10ML IV SOLN
INTRAVENOUS | Status: DC | PRN
  Administered 2018-01-23: 50 mg via INTRAVENOUS

## 2018-01-23 MED ORDER — IBUPROFEN 600 MG PO TABS: 600.0000 mg | ORAL_TABLET | Freq: Four times a day (QID) | ORAL | Status: DC | PRN

## 2018-01-23 MED ORDER — SCOPOLAMINE 1 MG/3DAYS TD PT72
MEDICATED_PATCH | TRANSDERMAL | Status: AC
  Filled 2018-01-23: qty 1

## 2018-01-23 MED ORDER — HYDROMORPHONE HCL 1 MG/ML IJ SOLN
INTRAMUSCULAR | Status: AC
  Filled 2018-01-23: qty 0.5

## 2018-01-23 MED ORDER — SUGAMMADEX SODIUM 200 MG/2ML IV SOLN
INTRAVENOUS | Status: AC
  Filled 2018-01-23: qty 2

## 2018-01-23 MED ORDER — LACTATED RINGERS IV SOLN: INTRAVENOUS | Status: DC

## 2018-01-23 MED ORDER — KETOROLAC TROMETHAMINE 30 MG/ML IJ SOLN
INTRAMUSCULAR | Status: DC | PRN
  Administered 2018-01-23: 30 mg via INTRAVENOUS

## 2018-01-23 MED ORDER — LOSARTAN POTASSIUM-HCTZ 100-12.5 MG PO TABS: 1.0000 | ORAL_TABLET | Freq: Every evening | ORAL | Status: DC

## 2018-01-23 MED ORDER — HYDROMORPHONE HCL 1 MG/ML IJ SOLN
0.2500 mg | INTRAMUSCULAR | Status: DC | PRN
  Administered 2018-01-23 (×2): 0.5 mg via INTRAVENOUS

## 2018-01-23 MED ORDER — HYDROMORPHONE HCL 1 MG/ML IJ SOLN
INTRAMUSCULAR | Status: AC
  Filled 2018-01-23: qty 1

## 2018-01-23 MED ORDER — MIDAZOLAM HCL 2 MG/2ML IJ SOLN
INTRAMUSCULAR | Status: AC
Start: 2018-01-23 — End: ?
  Filled 2018-01-23: qty 2

## 2018-01-23 MED ORDER — LIDOCAINE HCL (CARDIAC) PF 100 MG/5ML IV SOSY
PREFILLED_SYRINGE | INTRAVENOUS | Status: DC | PRN
  Administered 2018-01-23: 50 mg via INTRAVENOUS

## 2018-01-23 MED ORDER — DEXAMETHASONE SODIUM PHOSPHATE 10 MG/ML IJ SOLN
INTRAMUSCULAR | Status: DC | PRN
  Administered 2018-01-23: 4 mg via INTRAVENOUS

## 2018-01-23 MED ORDER — FENTANYL CITRATE (PF) 100 MCG/2ML IJ SOLN
INTRAMUSCULAR | Status: DC | PRN
  Administered 2018-01-23 (×4): 50 ug via INTRAVENOUS

## 2018-01-23 MED ORDER — ONDANSETRON HCL 4 MG/2ML IJ SOLN: 4.0000 mg | Freq: Four times a day (QID) | INTRAMUSCULAR | Status: DC | PRN

## 2018-01-23 MED ORDER — KETOROLAC TROMETHAMINE 30 MG/ML IJ SOLN: 30.0000 mg | Freq: Four times a day (QID) | INTRAMUSCULAR | Status: DC

## 2018-01-23 MED ORDER — HYDROMORPHONE HCL 1 MG/ML IJ SOLN: 0.2000 mg | INTRAMUSCULAR | Status: DC | PRN

## 2018-01-23 MED ORDER — PROMETHAZINE HCL 25 MG/ML IJ SOLN: 6.2500 mg | INTRAMUSCULAR | Status: DC | PRN

## 2018-01-23 MED ORDER — KETOROLAC TROMETHAMINE 30 MG/ML IJ SOLN
INTRAMUSCULAR | Status: AC
  Filled 2018-01-23: qty 1

## 2018-01-23 MED ORDER — FENTANYL CITRATE (PF) 250 MCG/5ML IJ SOLN
INTRAMUSCULAR | Status: AC
  Filled 2018-01-23: qty 5

## 2018-01-23 MED ORDER — KETOROLAC TROMETHAMINE 30 MG/ML IJ SOLN: 15.0000 mg | Freq: Four times a day (QID) | INTRAMUSCULAR | Status: DC

## 2018-01-23 MED ORDER — SCOPOLAMINE 1 MG/3DAYS TD PT72: 1.0000 | MEDICATED_PATCH | TRANSDERMAL | Status: DC

## 2018-01-23 MED ORDER — ONDANSETRON HCL 4 MG PO TABS: 4.0000 mg | ORAL_TABLET | Freq: Four times a day (QID) | ORAL | Status: DC | PRN

## 2018-01-23 MED ORDER — BUPIVACAINE HCL (PF) 0.25 % IJ SOLN
INTRAMUSCULAR | Status: DC | PRN
  Administered 2018-01-23: 4 mL
  Administered 2018-01-23: 5 mL

## 2018-01-23 MED ORDER — VASOPRESSIN 20 UNIT/ML IV SOLN
INTRAVENOUS | Status: AC
  Filled 2018-01-23: qty 1

## 2018-01-23 MED ORDER — DEXAMETHASONE SODIUM PHOSPHATE 4 MG/ML IJ SOLN
INTRAMUSCULAR | Status: AC
  Filled 2018-01-23: qty 1

## 2018-01-23 MED ORDER — SUGAMMADEX SODIUM 200 MG/2ML IV SOLN
INTRAVENOUS | Status: DC | PRN
  Administered 2018-01-23: 175 mg via INTRAVENOUS

## 2018-01-23 MED ORDER — LACTATED RINGERS IV SOLN
INTRAVENOUS | Status: DC
  Administered 2018-01-23 (×2): via INTRAVENOUS

## 2018-01-23 MED ORDER — EPHEDRINE SULFATE 50 MG/ML IJ SOLN
INTRAMUSCULAR | Status: DC | PRN
  Administered 2018-01-23 (×4): 10 mg via INTRAVENOUS

## 2018-01-23 MED ORDER — STERILE WATER FOR IRRIGATION IR SOLN
Status: DC | PRN
  Administered 2018-01-23: 1000 mL via INTRAVESICAL

## 2018-01-23 MED ORDER — BUPIVACAINE HCL (PF) 0.25 % IJ SOLN
INTRAMUSCULAR | Status: AC
  Filled 2018-01-23: qty 30

## 2018-01-23 MED ORDER — MENTHOL 3 MG MT LOZG: 1.0000 | LOZENGE | OROMUCOSAL | Status: DC | PRN

## 2018-01-23 MED ORDER — LEVOTHYROXINE SODIUM 88 MCG PO TABS
88.0000 ug | ORAL_TABLET | Freq: Every day | ORAL | Status: DC
  Administered 2018-01-24: 88 ug via ORAL
  Filled 2018-01-23: qty 1

## 2018-01-23 MED ORDER — SODIUM CHLORIDE 0.9 % IJ SOLN
INTRAMUSCULAR | Status: DC | PRN
  Administered 2018-01-23: 10 mL

## 2018-01-23 MED ORDER — PROPOFOL 10 MG/ML IV BOLUS
INTRAVENOUS | Status: AC
  Filled 2018-01-23: qty 20

## 2018-01-23 MED ORDER — METOPROLOL SUCCINATE ER 25 MG PO TB24
25.0000 mg | ORAL_TABLET | Freq: Every day | ORAL | Status: DC
  Filled 2018-01-23: qty 1

## 2018-01-23 MED ORDER — ONDANSETRON HCL 4 MG/2ML IJ SOLN
INTRAMUSCULAR | Status: DC | PRN
  Administered 2018-01-23: 4 mg via INTRAVENOUS

## 2018-01-23 MED ORDER — SODIUM CHLORIDE 0.9 % IJ SOLN
INTRAMUSCULAR | Status: AC
  Filled 2018-01-23: qty 100

## 2018-01-23 MED ORDER — LACTATED RINGERS IV SOLN
INTRAVENOUS | Status: DC
  Administered 2018-01-23 (×2): via INTRAVENOUS

## 2018-01-23 MED ORDER — LIDOCAINE HCL (PF) 1 % IJ SOLN
INTRAMUSCULAR | Status: AC
  Filled 2018-01-23: qty 5

## 2018-01-23 MED ORDER — VASOPRESSIN 20 UNIT/ML IV SOLN
INTRAVENOUS | Status: DC | PRN
  Administered 2018-01-23: 10 mL via INTRAMUSCULAR

## 2018-01-23 MED ORDER — SODIUM CHLORIDE 0.9 % IR SOLN
Status: DC | PRN
  Administered 2018-01-23: 3000 mL

## 2018-01-23 MED ORDER — LOSARTAN POTASSIUM 50 MG PO TABS
100.0000 mg | ORAL_TABLET | Freq: Every evening | ORAL | Status: DC
  Administered 2018-01-23: 100 mg via ORAL
  Filled 2018-01-23: qty 2

## 2018-01-23 MED ORDER — HYDROCHLOROTHIAZIDE 12.5 MG PO CAPS
12.5000 mg | ORAL_CAPSULE | Freq: Every evening | ORAL | Status: DC
  Filled 2018-01-23: qty 1

## 2018-01-23 MED ORDER — EPHEDRINE 5 MG/ML INJ
INTRAVENOUS | Status: AC
Start: 2018-01-23 — End: ?
  Filled 2018-01-23: qty 10

## 2018-01-23 MED ORDER — ONDANSETRON HCL 4 MG/2ML IJ SOLN
INTRAMUSCULAR | Status: AC
  Filled 2018-01-23: qty 2

## 2018-01-23 MED ORDER — POLYETHYLENE GLYCOL 3350 17 G PO PACK
17.0000 g | PACK | Freq: Every day | ORAL | Status: DC
  Administered 2018-01-23: 17 g via ORAL
  Filled 2018-01-23: qty 1

## 2018-01-23 MED ORDER — TRAMADOL HCL 50 MG PO TABS
50.0000 mg | ORAL_TABLET | Freq: Four times a day (QID) | ORAL | Status: DC | PRN
  Administered 2018-01-24: 50 mg via ORAL
  Filled 2018-01-23: qty 1

## 2018-01-23 SURGICAL SUPPLY — 39 items
APL SRG 38 LTWT LNG FL B (MISCELLANEOUS) ×2
APPLICATOR ARISTA FLEXITIP XL (MISCELLANEOUS) ×2 IMPLANT
CABLE HIGH FREQUENCY MONO STRZ (ELECTRODE) IMPLANT
CONT PATH 16OZ SNAP LID 3702 (MISCELLANEOUS) ×4 IMPLANT
COVER BACK TABLE 60X90IN (DRAPES) ×4 IMPLANT
COVER MAYO STAND STRL (DRAPES) ×4 IMPLANT
DECANTER SPIKE VIAL GLASS SM (MISCELLANEOUS) ×2 IMPLANT
DRSG OPSITE POSTOP 3X4 (GAUZE/BANDAGES/DRESSINGS) ×4 IMPLANT
DURAPREP 26ML APPLICATOR (WOUND CARE) ×4 IMPLANT
ELECT REM PT RETURN 9FT ADLT (ELECTROSURGICAL) ×4
ELECTRODE REM PT RTRN 9FT ADLT (ELECTROSURGICAL) IMPLANT
FILTER SMOKE EVAC LAPAROSHD (FILTER) IMPLANT
GLOVE BIO SURGEON STRL SZ 6.5 (GLOVE) ×9 IMPLANT
GLOVE BIO SURGEONS STRL SZ 6.5 (GLOVE) ×3
GLOVE BIOGEL PI IND STRL 6.5 (GLOVE) ×2 IMPLANT
GLOVE BIOGEL PI IND STRL 7.0 (GLOVE) ×4 IMPLANT
GLOVE BIOGEL PI INDICATOR 6.5 (GLOVE) ×2
GLOVE BIOGEL PI INDICATOR 7.0 (GLOVE) ×4
HEMOSTAT ARISTA ABSORB 3G PWDR (MISCELLANEOUS) ×2 IMPLANT
LEGGING LITHOTOMY PAIR STRL (DRAPES) ×4 IMPLANT
NEEDLE INSUFFLATION 120MM (ENDOMECHANICALS) ×4 IMPLANT
NS IRRIG 1000ML POUR BTL (IV SOLUTION) ×4 IMPLANT
PACK LAVH (CUSTOM PROCEDURE TRAY) ×4 IMPLANT
PACK ROBOTIC GOWN (GOWN DISPOSABLE) ×4 IMPLANT
PACK TRENDGUARD 450 HYBRID PRO (MISCELLANEOUS) IMPLANT
PROTECTOR NERVE ULNAR (MISCELLANEOUS) ×8 IMPLANT
SET IRRIG TUBING LAPAROSCOPIC (IRRIGATION / IRRIGATOR) ×2 IMPLANT
SHEARS HARMONIC ACE PLUS 36CM (ENDOMECHANICALS) ×4 IMPLANT
SLEEVE XCEL OPT CAN 5 100 (ENDOMECHANICALS) ×8 IMPLANT
SUT VIC AB 0 CT1 18XCR BRD8 (SUTURE) ×4 IMPLANT
SUT VIC AB 0 CT1 8-18 (SUTURE) ×8
SUT VIC AB 2-0 CT1 (SUTURE) ×8 IMPLANT
SUT VICRYL 0 TIES 12 18 (SUTURE) IMPLANT
SUT VICRYL 4-0 PS2 18IN ABS (SUTURE) ×4 IMPLANT
TOWEL OR 17X24 6PK STRL BLUE (TOWEL DISPOSABLE) ×8 IMPLANT
TRAY FOLEY W/BAG SLVR 14FR (SET/KITS/TRAYS/PACK) ×4 IMPLANT
TRENDGUARD 450 HYBRID PRO PACK (MISCELLANEOUS) ×4
TROCAR XCEL NON-BLD 5MMX100MML (ENDOMECHANICALS) ×4 IMPLANT
WARMER LAPAROSCOPE (MISCELLANEOUS) ×4 IMPLANT

## 2018-01-23 NOTE — Anesthesia Procedure Notes (Signed)
Procedure Name: Intubation Date/Time: 01/23/2018 7:32 AM Performed by: Bufford Spikes, CRNA Pre-anesthesia Checklist: Patient identified, Emergency Drugs available, Suction available and Patient being monitored Patient Re-evaluated:Patient Re-evaluated prior to induction Oxygen Delivery Method: Circle system utilized Preoxygenation: Pre-oxygenation with 100% oxygen Induction Type: IV induction Ventilation: Mask ventilation without difficulty Laryngoscope Size: Miller and 2 Grade View: Grade II Tube type: Oral Tube size: 7.0 mm Number of attempts: 1 Airway Equipment and Method: Stylet Placement Confirmation: ETT inserted through vocal cords under direct vision,  positive ETCO2 and breath sounds checked- equal and bilateral Secured at: 21 cm Tube secured with: Tape Dental Injury: Teeth and Oropharynx as per pre-operative assessment

## 2018-01-23 NOTE — Op Note (Addendum)
Operative Note    Preoperative Diagnosis Intrauterine mass Right adnexal mass  Postoperative Diagnosis Same   Procedure LAVH/BSO Cystoscopy  Surgeon Paula Compton, MD Carlynn Purl, DO  Anesthesia GETA  Fluids: EBL 133mL UOP 269mL clear IVF 1253mL  Findings Uterus was enlarged with fibroids, 6 weeks size Normal left ovary, right ovary with solid smooth nodule about 1-2 cm Remainder of pelvis and abdomen WNL  Specimen Uterus, cervix, tubes, ovaries  Procedure Note Patient was taken to the operating room where general anesthesia was obtained without difficulty. She was then prepped and draped in the normal sterile fashion in the dorsal lithotomy position. An appropriate timeout was performed. A speculum was then placed within the vagina and a Hulka tenaculum placed within the cervix for uterine manipulation. A foley catheter was placed in the bladder.  Attention was then turned to the patient's abdomen after draping where the infraumbilical area was injected with approximately 10 cc of quarter percent Marcaine. A 1 cm incision was then made within the umbilicus and the varies needle easily introduced into the peritoneal cavity. Intraperitoneal placement was confirmed by aspiration and injection with normal saline. Gas flow was then applied and a pneumoperitoneum obtained with approximate 3 L of CO2 gas. The varies needle was then removed and a 5 mm optiview trocar was easily introduced into the abdomen under direct visualization. . With patient in Trendelenburg the uterus and tubes and ovaries were inspected with findings as previously stated. Two additional trocars were placed in the upper lateral quadrants under direct visualization after injection with quarter percent marcaine.  THe Harmonic scalpel was then utilized to dissect the fallopian tubes from the mesosalpinx bilaterally down to the level of the cornua.  The remainder of the uteroovarian ligament and the round  ligament were then also taken down with the Harmonic to the level of the bladder flap.  The bladder flap was taken down from the lower uterine segment and pushed away to expose the cervix.    Attention was then turned to the vagina after all instruments were removed and the trocars covered with a sterile drape. The cervix was grasped with Yates Decamp tenaculums x 2 and injected with a dilute solution of Pitressin circumferentially.  The bovie was then used to make a circumferential incision.  The mayo scissors then further dissected the vaginal mucosa from the underlying cervix and the anterior and posterior cul de sac entered sharply.  With a banana speculum and deaver retractor isolating the uterus from the bladder and rectum.  The uterosacral ligaments and paracervical tissue was taken down sequentially with parametrial clamps and suture ligated with zero vicryl at each step.  When  the was uterus freed on the patient's left, it was then delivered and the remaining tissue on the right clamped and transected completely freeing the uterus and tubes.  It was handed off to pathology.   A small amount of bleeding was noted on each of the uterine angles and  was controlled with several figure of eight sutures of zero vicryl and all was hemostatic. The uterosacral ligaments were approximated with zero vicryl.  The short weighted speculum was placed and the cuff run with a running locked 2-0 vicryl for hemostasis. All instruments were then removed from the vagina. A cystoscopy was then performed with the 70 degree scope with no sutures noted and the ureteral orifices noted to have urine streams. The bladder was drained and foley replaced.   Gowns and gloves were changed and attention was returned  to the abdomen, where pneumoperitoneum was again obtained and all inspected.  The cuff had a small amount of oozing at the right angle which was controlled with harmonic scalpel and Arista.. A four quadrant view of the pelvis and  abdomen was performed and found to be normal with no bleeding or injuries noted.  A small free fibroid was noted in the cul-de-sac and removed with the left port.   The instruments were removed from the abdomen as well as the 5 mm lateral ports under visualization.  The pneumoperitoneum was reduced through the trocar. The trocar was finally removed and the infraumbilical incision and lateral incisions were closed with a subcuticular stitch of 3-0 Vicryl. Dermabond and a bandage were placed. Patient was then awakened and taken to the recovery room in good condition.

## 2018-01-23 NOTE — Anesthesia Postprocedure Evaluation (Signed)
Anesthesia Post Note  Patient: Erica Calhoun  Procedure(s) Performed: LAPAROSCOPIC ASSISTED VAGINAL HYSTERECTOMY WITH SALPINGO OOPHORECTOMY (Bilateral Abdomen) CYSTOSCOPY (N/A Urethra)     Patient location during evaluation: PACU Anesthesia Type: General Level of consciousness: awake and alert Pain management: pain level controlled Vital Signs Assessment: post-procedure vital signs reviewed and stable Respiratory status: spontaneous breathing, nonlabored ventilation, respiratory function stable and patient connected to nasal cannula oxygen Cardiovascular status: blood pressure returned to baseline and stable Postop Assessment: no apparent nausea or vomiting Anesthetic complications: no    Last Vitals:  Vitals:   01/23/18 1045 01/23/18 1112  BP: 120/77 (!) 136/92  Pulse: 68 62  Resp: 14 18  Temp:  36.4 C  SpO2: 99% 99%    Last Pain:  Vitals:   01/23/18 1115  TempSrc:   PainSc: 5    Pain Goal: Patients Stated Pain Goal: 5 (01/23/18 6440)               Tiajuana Amass

## 2018-01-23 NOTE — Transfer of Care (Signed)
Immediate Anesthesia Transfer of Care Note  Patient: Erica Calhoun  Procedure(s) Performed: LAPAROSCOPIC ASSISTED VAGINAL HYSTERECTOMY WITH SALPINGO OOPHORECTOMY (Bilateral Abdomen) CYSTOSCOPY (N/A Urethra)  Patient Location: PACU  Anesthesia Type:General  Level of Consciousness: awake, alert  and oriented  Airway & Oxygen Therapy: Patient Spontanous Breathing and Patient connected to nasal cannula oxygen  Post-op Assessment: Report given to RN and Post -op Vital signs reviewed and stable  Post vital signs: Reviewed and stable  Last Vitals:  Vitals Value Taken Time  BP 131/61 01/23/2018  9:46 AM  Temp    Pulse 75 01/23/2018  9:50 AM  Resp 22 01/23/2018  9:50 AM  SpO2 100 % 01/23/2018  9:50 AM  Vitals shown include unvalidated device data.  Last Pain:  Vitals:   01/23/18 0635  TempSrc: Oral  PainSc: 6       Patients Stated Pain Goal: 5 (93/79/02 4097)  Complications: No apparent anesthesia complications

## 2018-01-23 NOTE — Discharge Summary (Signed)
Physician Discharge Summary  Patient ID: Erica Calhoun MRN: [id number] DOB/AGE: 1950/12/16 67 y.o.  Admit date: 01/23/2018 Discharge date: 01/24/2018  Admission Diagnoses: Ovarian Mass Intrauterine mass  Discharge Diagnoses:  same Active Problems:   S/P laparoscopic assisted vaginal hysterectomy (LAVH) Bilateral salpingoophorectomies Cystoscopy  Discharged Condition: good  Hospital Course: Pt admitted for routine  post-operative care following LAVH/BSO. Day of surgery, she was doing well and tolerating po intake.  She was ambulating and had her foley removed late afternoon.  Significant Diagnostic Studies: labs: CBC, BMP   Discharge Exam: Blood pressure 139/61, pulse (!) 57, temperature 98 F (36.7 C), temperature source Oral, resp. rate 17, height 5\' 3"  (1.6 m), weight 86.2 kg (190 lb), SpO2 100 %. General appearance: alert and cooperative GI: soft NT Incision/Wound: CDI  LABS WNL  Hgb 12.6 and creatinine 0.64  Disposition: Discharge disposition: 01-Home or Self Care       Discharge Instructions    Call MD for:  persistant nausea and vomiting   Complete by:  As directed    Call MD for:  redness, tenderness, or signs of infection (pain, swelling, redness, odor or green/yellow discharge around incision site)   Complete by:  As directed    Call MD for:  severe uncontrolled pain   Complete by:  As directed    Call MD for:  temperature >100.4   Complete by:  As directed    Diet - low sodium heart healthy   Complete by:  As directed    Discharge instructions   Complete by:  As directed    Avoid driving for at least 1 week or until off narcotic pain meds.  No heavy lifting greater than 10 lbs.  Nothing in vagina for 6 weeks.    Shower over incisions and pat dry.     Allergies as of 01/24/2018      Reactions   Penicillins Shortness Of Breath, Swelling, Rash   Has patient had a PCN reaction causing immediate rash, facial/tongue/throat swelling, SOB or  lightheadedness with hypotension:Yes Has patient had a PCN reaction causing severe rash involving mucus membranes or skin necrosis: No Has patient had a PCN reaction that required hospitalization No Has patient had a PCN reaction occurring within the last 10 years: No If all of the above answers are "NO", then may proceed with Cephalosporin use.   Adhesive [tape]    Blisters skin  MUST USE PAPER TAPE   Aspirin Other (See Comments)   In high doses causes ears rings   Codeine Other (See Comments)   headache   Morphine Other (See Comments)   headache      Medication List    STOP taking these medications   tiZANidine 4 MG tablet Commonly known as:  ZANAFLEX     TAKE these medications   BENEFIBER Powd Take 1 Scoop by mouth daily. Mixed with miralax   CALTRATE 600+D PO Take 1 tablet by mouth daily.   carboxymethylcellulose 0.5 % Soln Commonly known as:  REFRESH PLUS Apply 1-2 drops to eye 3 (three) times daily as needed (for dry eyes).   fluticasone 50 MCG/ACT nasal spray Commonly known as:  FLONASE Place 1 spray into both nostrils daily as needed for allergies or rhinitis.   ibuprofen 600 MG tablet Commonly known as:  ADVIL,MOTRIN Take 1 tablet (600 mg total) by mouth every 6 (six) hours as needed (mild pain).   levothyroxine 88 MCG tablet Commonly known as:  SYNTHROID, LEVOTHROID Take 88 mcg by  mouth daily before breakfast.   loratadine 10 MG tablet Commonly known as:  CLARITIN Take 10 mg by mouth daily as needed for allergies.   losartan-hydrochlorothiazide 100-12.5 MG tablet Commonly known as:  HYZAAR Take 1 tablet by mouth every evening.   metoprolol succinate 25 MG 24 hr tablet Commonly known as:  TOPROL-XL Take 25 mg by mouth daily.   multivitamin with minerals Tabs tablet Take 1 tablet by mouth daily.   polyethylene glycol packet Commonly known as:  MIRALAX / GLYCOLAX Take 17 g by mouth daily.   traMADol 50 MG tablet Commonly known as:  ULTRAM Take  50 mg by mouth 3 (three) times daily as needed for severe pain.   triamcinolone cream 0.1 % Commonly known as:  KENALOG Apply 1 application topically daily as needed (rash).   vitamin C 1000 MG tablet Take 1,000 mg by mouth 2 (two) times daily.   Vitamin D (Ergocalciferol) 50000 units Caps capsule Commonly known as:  DRISDOL Take 50,000 Units by mouth every Friday.      Follow-up Information    Paula Compton, MD. Schedule an appointment as soon as possible for a visit in 2 week(s).   Specialty:  Obstetrics and Gynecology Why:  incision check Contact information: [address] [place] [address] [phone]           Signed: Logan Bores 01/24/2018, 8:30 AM

## 2018-01-23 NOTE — Progress Notes (Signed)
Patient ID: Erica Calhoun, female   DOB: 1951-02-11, 67 y.o.   MRN: [id number] Per pt no changes in dictated H&P.  Brief exam WNL.  Neurosurgery cleared her for positioning in dorsal lithotomy.  Ready to proceed

## 2018-01-23 NOTE — Progress Notes (Signed)
Day of Surgery Procedure(s) (LRB): LAPAROSCOPIC ASSISTED VAGINAL HYSTERECTOMY WITH SALPINGO OOPHORECTOMY (Bilateral) CYSTOSCOPY (N/A)  Subjective: Patient reports tolerating PO and + flatus.  She feels like she needs to have a BM.  Has had one small void with foley just out.  Objective: I have reviewed patient's vital signs and intake and output.  General: alert and cooperative GI: incision: clean, dry and intact  Assessment: s/p Procedure(s): LAPAROSCOPIC ASSISTED VAGINAL HYSTERECTOMY WITH SALPINGO OOPHORECTOMY (Bilateral) CYSTOSCOPY (N/A): progressing well  Plan: Discontinue IV fluids  Change to po meds Feeling well and will shoot for early AM discharge.  LOS: 0 days    Logan Bores 01/23/2018, 4:25 PM

## 2018-01-24 ENCOUNTER — Encounter (HOSPITAL_COMMUNITY): Payer: Self-pay | Admitting: Obstetrics and Gynecology

## 2018-01-24 DIAGNOSIS — E039 Hypothyroidism, unspecified: Secondary | ICD-10-CM | POA: Diagnosis not present

## 2018-01-24 DIAGNOSIS — D259 Leiomyoma of uterus, unspecified: Secondary | ICD-10-CM | POA: Diagnosis not present

## 2018-01-24 DIAGNOSIS — I10 Essential (primary) hypertension: Secondary | ICD-10-CM | POA: Diagnosis not present

## 2018-01-24 DIAGNOSIS — N840 Polyp of corpus uteri: Secondary | ICD-10-CM | POA: Diagnosis not present

## 2018-01-24 DIAGNOSIS — N888 Other specified noninflammatory disorders of cervix uteri: Secondary | ICD-10-CM | POA: Diagnosis not present

## 2018-01-24 DIAGNOSIS — N839 Noninflammatory disorder of ovary, fallopian tube and broad ligament, unspecified: Secondary | ICD-10-CM | POA: Diagnosis not present

## 2018-01-24 LAB — BASIC METABOLIC PANEL
Anion gap: 11 (ref 5–15)
BUN: 18 mg/dL (ref 6–20)
CO2: 25 mmol/L (ref 22–32)
CREATININE: 0.64 mg/dL (ref 0.44–1.00)
Calcium: 8.8 mg/dL — ABNORMAL LOW (ref 8.9–10.3)
Chloride: 99 mmol/L — ABNORMAL LOW (ref 101–111)
GFR calc Af Amer: >60 mL/min (ref >60)
Glucose, Bld: 116 mg/dL — ABNORMAL HIGH (ref 65–99)
POTASSIUM: 4.1 mmol/L (ref 3.5–5.1)
SODIUM: 135 mmol/L (ref 135–145)

## 2018-01-24 LAB — CBC
HCT: 35.4 % — ABNORMAL LOW (ref 36.0–46.0)
Hemoglobin: 12.6 g/dL (ref 12.0–15.0)
MCH: 30.1 pg (ref 26.0–34.0)
MCHC: 35.6 g/dL (ref 30.0–36.0)
MCV: 84.5 fL (ref 78.0–100.0)
PLATELETS: 172 10*3/uL (ref 150–400)
RBC: 4.19 MIL/uL (ref 3.87–5.11)
RDW: 12.5 % (ref 11.5–15.5)
WBC: 8.7 10*3/uL (ref 4.0–10.5)

## 2018-01-24 MED ORDER — IBUPROFEN 600 MG PO TABS: 600.0000 mg | ORAL_TABLET | Freq: Four times a day (QID) | ORAL | 0 refills | Status: DC | PRN

## 2018-01-24 MED ORDER — HYDROCHLOROTHIAZIDE 12.5 MG PO CAPS: 12.5000 mg | ORAL_CAPSULE | Freq: Every evening | ORAL | Status: DC

## 2018-01-24 MED ORDER — LOSARTAN POTASSIUM 50 MG PO TABS: 100.0000 mg | ORAL_TABLET | Freq: Every evening | ORAL | Status: DC

## 2018-01-24 NOTE — Progress Notes (Signed)
1 Day Post-Op Procedure(s) (LRB): LAPAROSCOPIC ASSISTED VAGINAL HYSTERECTOMY WITH SALPINGO OOPHORECTOMY (Bilateral) CYSTOSCOPY (N/A)  Subjective: Patient reports tolerating PO, + flatus, + BM and no problems voiding.   Ambulating fine and pain controlled with po meds  Objective: I have reviewed patient's vital signs and labs.  General: alert and cooperative GI: soft NT and incisions clear, mild bruising Minimal vaginal bleeding  Assessment: s/p Procedure(s): LAPAROSCOPIC ASSISTED VAGINAL HYSTERECTOMY WITH SALPINGO OOPHORECTOMY (Bilateral) CYSTOSCOPY (N/A): progressing well  Plan: Discharge home  LOS: 0 days    Logan Bores 01/24/2018, 8:26 AM

## 2018-01-24 NOTE — Progress Notes (Signed)
Discharge instructions given to patient.  Discussed post operative instructions.  Medications and follow up appointments reviewed.  IV removed and paperwork signed.

## 2018-02-20 DIAGNOSIS — Z1331 Encounter for screening for depression: Secondary | ICD-10-CM | POA: Diagnosis not present

## 2018-02-20 DIAGNOSIS — E063 Autoimmune thyroiditis: Secondary | ICD-10-CM | POA: Diagnosis not present

## 2018-02-20 DIAGNOSIS — Z9181 History of falling: Secondary | ICD-10-CM | POA: Diagnosis not present

## 2018-02-20 DIAGNOSIS — I10 Essential (primary) hypertension: Secondary | ICD-10-CM | POA: Diagnosis not present

## 2018-02-20 DIAGNOSIS — M810 Age-related osteoporosis without current pathological fracture: Secondary | ICD-10-CM | POA: Diagnosis not present

## 2018-03-22 DIAGNOSIS — M810 Age-related osteoporosis without current pathological fracture: Secondary | ICD-10-CM | POA: Diagnosis not present

## 2018-03-22 DIAGNOSIS — Z1231 Encounter for screening mammogram for malignant neoplasm of breast: Secondary | ICD-10-CM | POA: Diagnosis not present

## 2018-03-22 DIAGNOSIS — M85832 Other specified disorders of bone density and structure, left forearm: Secondary | ICD-10-CM | POA: Diagnosis not present

## 2018-03-26 DIAGNOSIS — M47816 Spondylosis without myelopathy or radiculopathy, lumbar region: Secondary | ICD-10-CM | POA: Diagnosis not present

## 2018-03-26 DIAGNOSIS — M5440 Lumbago with sciatica, unspecified side: Secondary | ICD-10-CM | POA: Diagnosis not present

## 2018-05-22 DIAGNOSIS — M25562 Pain in left knee: Secondary | ICD-10-CM | POA: Diagnosis not present

## 2018-05-22 DIAGNOSIS — M25561 Pain in right knee: Secondary | ICD-10-CM | POA: Diagnosis not present

## 2018-05-23 NOTE — Progress Notes (Signed)
Please place orders in Epic as patient is being scheduled for a pre-op appointment! Thank you! 

## 2018-05-30 DIAGNOSIS — Z6834 Body mass index (BMI) 34.0-34.9, adult: Secondary | ICD-10-CM | POA: Diagnosis not present

## 2018-05-30 DIAGNOSIS — E119 Type 2 diabetes mellitus without complications: Secondary | ICD-10-CM | POA: Diagnosis not present

## 2018-05-30 DIAGNOSIS — Z01818 Encounter for other preprocedural examination: Secondary | ICD-10-CM | POA: Diagnosis not present

## 2018-06-03 ENCOUNTER — Telehealth: Payer: Self-pay

## 2018-06-03 ENCOUNTER — Encounter: Payer: Self-pay | Admitting: Cardiology

## 2018-06-03 ENCOUNTER — Ambulatory Visit (INDEPENDENT_AMBULATORY_CARE_PROVIDER_SITE_OTHER): Payer: Medicare Other | Admitting: Cardiology

## 2018-06-03 DIAGNOSIS — Z01818 Encounter for other preprocedural examination: Secondary | ICD-10-CM | POA: Diagnosis not present

## 2018-06-03 DIAGNOSIS — E785 Hyperlipidemia, unspecified: Secondary | ICD-10-CM

## 2018-06-03 DIAGNOSIS — I2510 Atherosclerotic heart disease of native coronary artery without angina pectoris: Secondary | ICD-10-CM | POA: Diagnosis not present

## 2018-06-03 DIAGNOSIS — I10 Essential (primary) hypertension: Secondary | ICD-10-CM | POA: Insufficient documentation

## 2018-06-03 DIAGNOSIS — I350 Nonrheumatic aortic (valve) stenosis: Secondary | ICD-10-CM | POA: Diagnosis not present

## 2018-06-03 NOTE — Telephone Encounter (Signed)
   [facility] Pre-operative Risk Assessment    Request for surgical clearance:  1. What type of surgery is being performed? Bilateral Total Knee    2. When is this surgery scheduled? 06/20/18   3. What type of clearance is required (medical clearance vs. Pharmacy clearance to hold med vs. Both)? Medical  4. Are there any medications that need to be held prior to surgery and how long? No  5. Practice name and name of physician performing surgery? Dr. Alvan Dame    6. What is your office phone number: [phone]    7.   What is your office fax number: [phone]  8.   Anesthesia type (None, local, MAC, general) ? Spinal   Mary Sella Kordsmeier 06/03/2018, 7:43 AM  _________________________________________________________________   (provider comments below)

## 2018-06-03 NOTE — Progress Notes (Signed)
Cardiology Office Note    Date:  06/03/2018   ID:  Erica Calhoun, DOB 1951/05/21, MRN [id number]  PCP:  Ocie Doyne., MD  Cardiologist:  Fransico Him, MD   Chief Complaint  Patient presents with  . New Patient (Initial Visit)    Preoperative cardiac clearance    History of Present Illness:  Erica Calhoun is a 67 y.o. female who is being seen today for preoperative cardiac evaluation at the request of Ocie Doyne., MD.  This is a 67 year old female with a history of a SCID with cardiac catheterization March 2018 showing a 10% mid LAD, distal LAD and mid left circumflex with normal LV function with EF 60% and very mild aortic stenosis with peak aortic valve gradient 60 mmHg.  She is followed by Dr. Wynonia Lawman.  She has a history of hyperlipidemia, fibromyalgia, hypertension.  She had an LAVH with bilateral salpingo-oophorectomy back in May 2019 after clearance from Dr. Wynonia Lawman as well as back surgery prior to that and did well without any complications.  She is here today for followup and is doing well.  She denies any chest pain or pressure, SOB, DOE, PND, orthopnea, LE edema, dizziness, palpitations or syncope. She is compliant with her meds and is tolerating meds with no SE.     Past Medical History:  Diagnosis Date  . Arthritis    cervical and lower back   . Chest pain    s/p LHC 11/28/16: 10% mLAd, dLAD, mCx, EF 60%, very mild AS (peak grad 16) - cardiologist Dr. Wynonia Lawman  . Dyslipidemia   . Fibromyalgia   . Heart murmur    mild AS 11/2016 cath  . History of recurrent UTIs   . Hypertension   . Hypothyroidism   . Leaky heart valve    recent visit with cardiologist, okay'd for surgery  . Pneumonia    hx/1/17  . Ureter obstruction    right side, back in 1980's    No problems since    Past Surgical History:  Procedure Laterality Date  . ANTERIOR CERVICAL DECOMP/DISCECTOMY FUSION N/A 10/02/2016   Procedure: Anterior Cervical Diskectomy and Fusion - Cervical four- Cervical  five - Cervical five-Cervical six - Cervical six -Cervical seven;  Surgeon: Kary Kos, MD;  Location: [facility];  Service: Neurosurgery;  Laterality: N/A;  . ANTERIOR LAT LUMBAR FUSION Left 11/05/2017   Procedure: LUMBAR TWO-THREE ANTERIOR LATERAL LUMBAR FUSION WITH LATERAL PLATE;  Surgeon: Kary Kos, MD;  Location: [facility];  Service: Neurosurgery;  Laterality: Left;  . APPENDECTOMY  1978  . BUNIONECTOMY Bilateral   . CARDIAC CATHETERIZATION     2018 with Dr. Wynonia Lawman  . cyst on ovary     left ovary..  . CYSTOSCOPY N/A 01/23/2018   Procedure: CYSTOSCOPY;  Surgeon: Paula Compton, MD;  Location: [facility];  Service: Gynecology;  Laterality: N/A;  . LAPAROSCOPIC VAGINAL HYSTERECTOMY WITH SALPINGO OOPHORECTOMY Bilateral 01/23/2018   Procedure: LAPAROSCOPIC ASSISTED VAGINAL HYSTERECTOMY WITH SALPINGO OOPHORECTOMY;  Surgeon: Paula Compton, MD;  Location: [facility];  Service: Gynecology;  Laterality: Bilateral;  . LEFT HEART CATH AND CORONARY ANGIOGRAPHY N/A 11/28/2016   Procedure: Left Heart Cath and Coronary Angiography;  Surgeon: Troy Sine, MD;  Location: [facility];  Service: Cardiovascular;  Laterality: N/A;  . TUBAL LIGATION    . wisdon teeth      Current Medications: Current Meds  Medication Sig  . Ascorbic Acid (VITAMIN C) 1000 MG tablet Take 1,000 mg by mouth  2 (two) times daily.  . Calcium Carbonate-Vitamin D (CALTRATE 600+D PO) Take 1 tablet by mouth daily.  . carboxymethylcellulose (REFRESH PLUS) 0.5 % SOLN Apply 1-2 drops to eye 3 (three) times daily as needed (for dry eyes).  . fluticasone (FLONASE) 50 MCG/ACT nasal spray Place 1 spray into both nostrils daily as needed for allergies or rhinitis.  Marland Kitchen levothyroxine (SYNTHROID, LEVOTHROID) 88 MCG tablet Take 88 mcg by mouth daily before breakfast.  . loratadine (CLARITIN) 10 MG tablet Take 10 mg by mouth daily as needed for allergies.  Marland Kitchen losartan-hydrochlorothiazide (HYZAAR) 100-12.5 MG tablet Take 1 tablet by mouth every evening.    . metoprolol succinate (TOPROL-XL) 25 MG 24 hr tablet Take 25 mg by mouth daily.  . Multiple Vitamin (MULTIVITAMIN WITH MINERALS) TABS tablet Take 1 tablet by mouth daily.  . polyethylene glycol (MIRALAX / GLYCOLAX) packet Take 17 g by mouth daily.  . traMADol (ULTRAM) 50 MG tablet Take 50 mg by mouth 3 (three) times daily as needed for severe pain.   Marland Kitchen triamcinolone cream (KENALOG) 0.1 % Apply 1 application topically daily as needed (rash).   . Vitamin D, Ergocalciferol, (DRISDOL) 50000 units CAPS capsule Take 50,000 Units by mouth every Friday.  . Wheat Dextrin (BENEFIBER) POWD Take 1 Scoop by mouth daily. Mixed with miralax    Allergies:   Penicillins; Adhesive [tape]; Aspirin; Codeine; and Morphine   Social History   Socioeconomic History  . Marital status: Married    Spouse name: Not on file  . Number of children: Not on file  . Years of education: Not on file  . Highest education level: Not on file  Occupational History  . Not on file  Social Needs  . Financial resource strain: Not on file  . Food insecurity:    Worry: Not on file    Inability: Not on file  . Transportation needs:    Medical: Not on file    Non-medical: Not on file  Tobacco Use  . Smoking status: Never Smoker  . Smokeless tobacco: Never Used  Substance and Sexual Activity  . Alcohol use: No    Comment: quit 44's age  . Drug use: No  . Sexual activity: Not on file  Lifestyle  . Physical activity:    Days per week: Not on file    Minutes per session: Not on file  . Stress: Not on file  Relationships  . Social connections:    Talks on phone: Not on file    Gets together: Not on file    Attends religious service: Not on file    Active member of club or organization: Not on file    Attends meetings of clubs or organizations: Not on file    Relationship status: Not on file  Other Topics Concern  . Not on file  Social History Narrative  . Not on file     Family History:  The patient's family  history is not on file.   ROS:   Please see the history of present illness.    ROS All other systems reviewed and are negative.  No flowsheet data found.     PHYSICAL EXAM:   VS:  BP (!) 142/88   Pulse 63   Ht 5\' 3"  (1.6 m)   Wt 195 lb 3.2 oz (88.5 kg)   SpO2 97%   BMI 34.58 kg/m    GEN: Well nourished, well developed, in no acute distress  HEENT: normal  Neck: no JVD, carotid  bruits, or masses Cardiac: RRR; no  rubs, or gallops,no edema.  Intact distal pulses bilaterally.  2/6 mid peaking systolic murmur at the right upper sternal border rating to left lower sternal border. Respiratory:  clear to auscultation bilaterally, normal work of breathing GI: soft, nontender, nondistended, + BS MS: no deformity or atrophy  Skin: warm and dry, no rash Neuro:  Alert and Oriented x 3, Strength and sensation are intact Psych: euthymic mood, full affect  Wt Readings from Last 3 Encounters:  06/03/18 195 lb 3.2 oz (88.5 kg)  01/23/18 190 lb (86.2 kg)  01/14/18 190 lb 6 oz (86.4 kg)      Studies/Labs Reviewed:   EKG:  EKG is ordered today.  The ekg ordered today demonstrates normal sinus rhythm at 63 bpm with inferior infarct.  Unchanged from 2018.  Recent Labs: 01/14/2018: ALT 21 01/24/2018: BUN 18; Creatinine, Ser 0.64; Hemoglobin 12.6; Platelets 172; Potassium 4.1; Sodium 135   Lipid Panel No results found for: CHOL, TRIG, HDL, CHOLHDL, VLDL, LDLCALC, LDLDIRECT  Additional studies/ records that were reviewed today include:  Office notes from Ortho office   ASSESSMENT:    1. Preoperative clearance   2. Nonobstructive atherosclerosis of coronary artery   3. Nonrheumatic aortic valve stenosis   4. Benign essential HTN   5. Hyperlipidemia LDL goal <70      PLAN:  In order of problems listed above:  1. Preoperative cardiac clearance -she is completely asymptomatic from a cardiac standpoint.  Prior to her knee starting to bother her a few months ago she was able to walk 2  miles daily without any chest pain, chest pressure or shortness of breath.  Her revised cardiac risk index is low at 0.4% and considered low risk for intraoperative or perioperative cardiac events from undergoing surgery.  I will check a 2D echocardiogram to make sure aortic stenosis has remained stable if this does not appear to have progressed then she is stable to undergo bilateral knee replacement surgery.  2.  Nonobstructive CAD -cardiac cath in 2018 showed minimal nonobstructive CAD with 10% mid LAD, RCA and left circumflex.  LV function was normal.  3.  Mild AS -minimal aortic stenosis noted the time of cath in 2018 with peak aortic valve gradient 16 mmHg.  It does not appear that she has had a 2D echo  done any time recent so I will check a 2D echocardiogram to make sure aortic stenosis remains mild.    4.  HTN - BP is well controlled on exam today.  She will continue on losartan-HCTZ 100-12.5 mg daily as well as Toprol-XL 25 mg daily.  4.  Hyperlipidemia with LDL goal < 70.  I will get an LDL from her PCP.  She has not been on a statin.   Medication Adjustments/Labs and Tests Ordered: Current medicines are reviewed at length with the patient today.  Concerns regarding medicines are outlined above.  Medication changes, Labs and Tests ordered today are listed in the Patient Instructions below.  Patient Instructions  Medication Instructions:  Your physician recommends that you continue on your current medications as directed. Please refer to the Current Medication list given to you today.  Testing/Procedures: Your physician has requested that you have an echocardiogram. Echocardiography is a painless test that uses sound waves to create images of your heart. It provides your doctor with information about the size and shape of your heart and how well your heart's chambers and valves are working. This procedure takes  approximately one hour. There are no restrictions for this  procedure.  Follow-Up: As needed  If you need a refill on your cardiac medications before your next appointment, please call your pharmacy.      Signed, Fransico Him, MD  06/03/2018 2:33 PM    [address], [place], [place]  [address] Phone: [phone]; Fax: [phone]

## 2018-06-03 NOTE — Telephone Encounter (Signed)
Patient is seeing Dr. Radford Pax today for surgical clearance.

## 2018-06-03 NOTE — Patient Instructions (Signed)
Medication Instructions:  Your physician recommends that you continue on your current medications as directed. Please refer to the Current Medication list given to you today.  Testing/Procedures: Your physician has requested that you have an echocardiogram. Echocardiography is a painless test that uses sound waves to create images of your heart. It provides your doctor with information about the size and shape of your heart and how well your heart's chambers and valves are working. This procedure takes approximately one hour. There are no restrictions for this procedure.  Follow-Up: As needed  If you need a refill on your cardiac medications before your next appointment, please call your pharmacy.

## 2018-06-03 NOTE — Telephone Encounter (Signed)
Awaiting 2D echo to followup of AS prior to clearing

## 2018-06-03 NOTE — Telephone Encounter (Signed)
Please send this to cardiac clearance pool

## 2018-06-03 NOTE — H&P (Signed)
TOTAL KNEE ADMISSION H&P  Patient is being admitted for bilateral total knee arthroplasty.  Subjective:  Chief Complaint:   Bilateral knee OA / pain  HPI: Erica Calhoun, 67 y.o. female, has a history of pain and functional disability in the bilateral knee due to arthritis and has failed non-surgical conservative treatments for greater than 12 weeks to include NSAID's and/or analgesics and activity modification.  Onset of symptoms was gradual, starting 2+ years ago with gradually worsening course since that time. The patient noted no past surgery on the bilaterall knee(s).  Patient currently rates pain in the bilateral knee(s) at 7 out of 10 with activity. Patient has night pain, worsening of pain with activity and weight bearing, pain that interferes with activities of daily living, pain with passive range of motion, crepitus and joint swelling.  Patient has evidence of periarticular osteophytes and joint space narrowing by imaging studies.  There is no active infection.  Risks, benefits and expectations were discussed with the patient.  Risks including but not limited to the risk of anesthesia, blood clots, nerve damage, blood vessel damage, failure of the prosthesis, infection and up to and including death.  Patient understand the risks, benefits and expectations and wishes to proceed with surgery.   PCP: Ocie Doyne., MD  D/C Plans:       Home   Post-op Meds:       No Rx given  Tranexamic Acid:      To be given - IV   Decadron:      Is to be given  FYI:     Xarelto then ASA (Bil knees)  Norco  DME:   Rx given for - 3-n-1  PT:   OPPT Rx given   Patient Active Problem List   Diagnosis Date Noted  . Preoperative clearance 06/03/2018  . Nonobstructive atherosclerosis of coronary artery 06/03/2018  . Aortic stenosis 06/03/2018  . Benign essential HTN 06/03/2018  . Hyperlipidemia LDL goal <70 06/03/2018  . S/P laparoscopic assisted vaginal hysterectomy (LAVH) 01/23/2018  . Status  post lumbar spinal fusion 11/05/2017  . Abnormal nuclear stress test   . HNP (herniated nucleus pulposus), cervical 10/02/2016   Past Medical History:  Diagnosis Date  . Arthritis    cervical and lower back   . Chest pain    s/p LHC 11/28/16: 10% mLAd, dLAD, mCx, EF 60%, very mild AS (peak grad 16) - cardiologist Dr. Wynonia Lawman  . Dyslipidemia   . Fibromyalgia   . Heart murmur    mild AS 11/2016 cath  . History of recurrent UTIs   . Hypertension   . Hypothyroidism   . Leaky heart valve    recent visit with cardiologist, okay'd for surgery  . Pneumonia    hx/1/17  . Ureter obstruction    right side, back in 1980's    No problems since    Past Surgical History:  Procedure Laterality Date  . ANTERIOR CERVICAL DECOMP/DISCECTOMY FUSION N/A 10/02/2016   Procedure: Anterior Cervical Diskectomy and Fusion - Cervical four- Cervical five - Cervical five-Cervical six - Cervical six -Cervical seven;  Surgeon: Kary Kos, MD;  Location: [facility];  Service: Neurosurgery;  Laterality: N/A;  . ANTERIOR LAT LUMBAR FUSION Left 11/05/2017   Procedure: LUMBAR TWO-THREE ANTERIOR LATERAL LUMBAR FUSION WITH LATERAL PLATE;  Surgeon: Kary Kos, MD;  Location: [facility];  Service: Neurosurgery;  Laterality: Left;  . APPENDECTOMY  1978  . BUNIONECTOMY Bilateral   . CARDIAC CATHETERIZATION  2018 with Dr. Wynonia Lawman  . cyst on ovary     left ovary..  . CYSTOSCOPY N/A 01/23/2018   Procedure: CYSTOSCOPY;  Surgeon: Paula Compton, MD;  Location: [facility];  Service: Gynecology;  Laterality: N/A;  . LAPAROSCOPIC VAGINAL HYSTERECTOMY WITH SALPINGO OOPHORECTOMY Bilateral 01/23/2018   Procedure: LAPAROSCOPIC ASSISTED VAGINAL HYSTERECTOMY WITH SALPINGO OOPHORECTOMY;  Surgeon: Paula Compton, MD;  Location: [facility];  Service: Gynecology;  Laterality: Bilateral;  . LEFT HEART CATH AND CORONARY ANGIOGRAPHY N/A 11/28/2016   Procedure: Left Heart Cath and Coronary Angiography;  Surgeon: Troy Sine, MD;  Location: [facility];  Service: Cardiovascular;  Laterality: N/A;  . TUBAL LIGATION    . wisdon teeth      No current facility-administered medications for this encounter.    Current Outpatient Medications  Medication Sig Dispense Refill Last Dose  . Ascorbic Acid (VITAMIN C) 1000 MG tablet Take 1,000 mg by mouth 2 (two) times daily.   Taking  . Calcium Carbonate-Vitamin D (CALTRATE 600+D PO) Take 1 tablet by mouth daily.   Taking  . carboxymethylcellulose (REFRESH PLUS) 0.5 % SOLN Apply 1-2 drops to eye 3 (three) times daily as needed (for dry eyes).   Taking  . fluticasone (FLONASE) 50 MCG/ACT nasal spray Place 1 spray into both nostrils daily as needed for allergies or rhinitis.   Taking  . levothyroxine (SYNTHROID, LEVOTHROID) 88 MCG tablet Take 88 mcg by mouth daily before breakfast.  12 Taking  . loratadine (CLARITIN) 10 MG tablet Take 10 mg by mouth daily as needed for allergies.   Taking  . losartan-hydrochlorothiazide (HYZAAR) 100-12.5 MG tablet Take 1 tablet by mouth every evening.  3 Taking  . metoprolol succinate (TOPROL-XL) 25 MG 24 hr tablet Take 25 mg by mouth daily.   Taking  . Multiple Vitamin (MULTIVITAMIN WITH MINERALS) TABS tablet Take 1 tablet by mouth daily.   Taking  . polyethylene glycol (MIRALAX / GLYCOLAX) packet Take 17 g by mouth daily.   Taking  . traMADol (ULTRAM) 50 MG tablet Take 50 mg by mouth 3 (three) times daily as needed for severe pain.    Taking  . triamcinolone cream (KENALOG) 0.1 % Apply 1 application topically daily as needed (rash).    Taking  . Vitamin D, Ergocalciferol, (DRISDOL) 50000 units CAPS capsule Take 50,000 Units by mouth every Friday.   Taking  . Wheat Dextrin (BENEFIBER) POWD Take 1 Scoop by mouth daily. Mixed with miralax   Taking   Allergies  Allergen Reactions  . Penicillins Shortness Of Breath, Swelling and Rash    Has patient had a PCN reaction causing immediate rash, facial/tongue/throat swelling, SOB or lightheadedness with hypotension:Yes Has  patient had a PCN reaction causing severe rash involving mucus membranes or skin necrosis: No Has patient had a PCN reaction that required hospitalization No Has patient had a PCN reaction occurring within the last 10 years: No If all of the above answers are "NO", then may proceed with Cephalosporin use.   . Adhesive [Tape]     Blisters skin  MUST USE PAPER TAPE  . Aspirin Other (See Comments)    In high doses causes ears rings   . Codeine Other (See Comments)    headache  . Morphine Other (See Comments)    headache    Social History   Tobacco Use  . Smoking status: Never Smoker  . Smokeless tobacco: Never Used  Substance Use Topics  . Alcohol use: No    Comment:  quit 37's age       Review of Systems  Constitutional: Negative.   HENT: Negative.   Eyes: Negative.   Respiratory: Negative.   Cardiovascular: Negative.   Gastrointestinal: Negative.   Genitourinary: Negative.   Musculoskeletal: Positive for back pain and joint pain.  Skin: Negative.   Neurological: Negative.   Endo/Heme/Allergies: Negative.   Psychiatric/Behavioral: Negative.     Objective:  Physical Exam  Constitutional: She is oriented to person, place, and time. She appears well-developed.  HENT:  Head: Normocephalic.  Eyes: Pupils are equal, round, and reactive to light.  Neck: Neck supple. No JVD present. No tracheal deviation present. No thyromegaly present.  Cardiovascular: Normal rate, regular rhythm and intact distal pulses.  Murmur heard. Respiratory: Effort normal and breath sounds normal. No respiratory distress. She has no wheezes.  GI: Soft. There is no tenderness. There is no guarding.  Musculoskeletal:       Right knee: She exhibits decreased range of motion, swelling and bony tenderness. She exhibits no ecchymosis, no deformity, no laceration and no erythema. Tenderness found.       Left knee: She exhibits decreased range of motion, swelling and bony tenderness. She exhibits no  ecchymosis, no deformity, no laceration and no erythema. Tenderness found.  Lymphadenopathy:    She has no cervical adenopathy.  Neurological: She is alert and oriented to person, place, and time.  Skin: Skin is warm and dry.  Psychiatric: She has a normal mood and affect.    Vital signs in last 24 hours: Pulse Rate:  [63] 63 (09/30 1413) BP: (142)/(88) 142/88 (09/30 1413) SpO2:  [97 %] 97 % (09/30 1413) Weight:  [88.5 kg] 88.5 kg (09/30 1413)  Labs:   Estimated body mass index is 34.58 kg/m as calculated from the following:   Height as of 06/03/18: 5\' 3"  (1.6 m).   Weight as of 06/03/18: 88.5 kg.   Imaging Review Plain radiographs demonstrate severe degenerative joint disease of the bilateral knee(s). The bone quality appears to be good for age and reported activity level.   Preoperative templating of the joint replacement has been completed, documented, and submitted to the Operating Room personnel in order to optimize intra-operative equipment management.   Anticipated LOS equal to or greater than 2 midnights due to - Age 83 and older with one or more of the following:  - Obesity  - Expected need for hospital services (PT, OT, Nursing) required for safe  discharge  - Anticipated need for postoperative skilled nursing care or inpatient rehab  - Active co-morbidities: bilateral TKAs     Assessment/Plan:  End stage arthritis, bilateral knee   The patient history, physical examination, clinical judgment of the provider and imaging studies are consistent with end stage degenerative joint disease of the bilateral knee(s) and total knee arthroplasty is deemed medically necessary. The treatment options including medical management, injection therapy arthroscopy and arthroplasty were discussed at length. The risks and benefits of total knee arthroplasty were presented and reviewed. The risks due to aseptic loosening, infection, stiffness, patella tracking problems, thromboembolic  complications and other imponderables were discussed. The patient acknowledged the explanation, agreed to proceed with the plan and consent was signed. Patient is being admitted for inpatient treatment for surgery, pain control, PT, OT, prophylactic antibiotics, VTE prophylaxis, progressive ambulation and ADL's and discharge planning. The patient is planning to be discharged home.      [place] Aaniya Sterba   PA-C  06/03/2018, 10:45 PM

## 2018-06-06 ENCOUNTER — Other Ambulatory Visit: Payer: Self-pay

## 2018-06-06 ENCOUNTER — Ambulatory Visit (HOSPITAL_COMMUNITY): Payer: Medicare Other | Attending: Cardiology

## 2018-06-06 DIAGNOSIS — J189 Pneumonia, unspecified organism: Secondary | ICD-10-CM | POA: Insufficient documentation

## 2018-06-06 DIAGNOSIS — I10 Essential (primary) hypertension: Secondary | ICD-10-CM | POA: Diagnosis not present

## 2018-06-06 DIAGNOSIS — I083 Combined rheumatic disorders of mitral, aortic and tricuspid valves: Secondary | ICD-10-CM | POA: Insufficient documentation

## 2018-06-06 DIAGNOSIS — E039 Hypothyroidism, unspecified: Secondary | ICD-10-CM | POA: Diagnosis not present

## 2018-06-06 DIAGNOSIS — I350 Nonrheumatic aortic (valve) stenosis: Secondary | ICD-10-CM | POA: Diagnosis not present

## 2018-06-11 NOTE — Progress Notes (Addendum)
Clearance Dr. Micheal Likens 05-30-18 chart with LOV  Stress 11-28-16 epic  Echo 06-06-18 epic  Labs cbc/diff, bmp a1c, ua all in chart from 05-30-18   11-28-16 cath in epic  Clearance Dr. Fransico Him cardiology on chart

## 2018-06-11 NOTE — Patient Instructions (Signed)
Erica Calhoun  06/11/2018   Your procedure is scheduled on: 06-20-18  Report to [facility] Main  Entrance  Report to admitting at      0610 AM    Call this number if you have problems the morning of surgery [phone]    Remember: Do not eat food or drink liquids :After Midnight.  BRUSH YOUR TEETH MORNING OF SURGERY AND RINSE YOUR MOUTH OUT, NO CHEWING GUM CANDY OR MINTS.     Take these medicines the morning of surgery with A SIP OF WATER:metoprolol, levothyroxine                                 You may not have any metal on your body including hair pins and              piercings  Do not wear jewelry, make-up, lotions, powders or perfumes, deodorant             Do not wear nail polish.  Do not shave  48 hours prior to surgery.       Do not bring valuables to the hospital. [place].  Contacts, dentures or bridgework may not be worn into surgery.  Leave suitcase in the car. After surgery it may be brought to your room.                 Please read over the following fact sheets you were given: _____________________________________________________________________           [facility] - Preparing for Surgery Before surgery, you can play an important role.  Because skin is not sterile, your skin needs to be as free of germs as possible.  You can reduce the number of germs on your skin by washing with CHG (chlorahexidine gluconate) soap before surgery.  CHG is an antiseptic cleaner which kills germs and bonds with the skin to continue killing germs even after washing. Please DO NOT use if you have an allergy to CHG or antibacterial soaps.  If your skin becomes reddened/irritated stop using the CHG and inform your nurse when you arrive at Short Stay. Do not shave (including legs and underarms) for at least 48 hours prior to the first CHG shower.  You may shave your face/neck. Please follow these instructions  carefully:  1.  Shower with CHG Soap the night before surgery and the  morning of Surgery.  2.  If you choose to wash your hair, wash your hair first as usual with your  normal  shampoo.  3.  After you shampoo, rinse your hair and body thoroughly to remove the  shampoo.                           4.  Use CHG as you would any other liquid soap.  You can apply chg directly  to the skin and wash                       Gently with a scrungie or clean washcloth.  5.  Apply the CHG Soap to your body ONLY FROM THE NECK DOWN.   Do not use on face/ open  Wound or open sores. Avoid contact with eyes, ears mouth and genitals (private parts).                       Wash face,  Genitals (private parts) with your normal soap.             6.  Wash thoroughly, paying special attention to the area where your surgery  will be performed.  7.  Thoroughly rinse your body with warm water from the neck down.  8.  DO NOT shower/wash with your normal soap after using and rinsing off  the CHG Soap.                9.  Pat yourself dry with a clean towel.            10.  Wear clean pajamas.            11.  Place clean sheets on your bed the night of your first shower and do not  sleep with pets. Day of Surgery : Do not apply any lotions/deodorants the morning of surgery.  Please wear clean clothes to the hospital/surgery center.  FAILURE TO FOLLOW THESE INSTRUCTIONS MAY RESULT IN THE CANCELLATION OF YOUR SURGERY PATIENT SIGNATURE_________________________________  NURSE SIGNATURE__________________________________  ________________________________________________________________________  WHAT IS A BLOOD TRANSFUSION? Blood Transfusion Information  A transfusion is the replacement of blood or some of its parts. Blood is made up of multiple cells which provide different functions.  Red blood cells carry oxygen and are used for blood loss replacement.  White blood cells fight against  infection.  Platelets control bleeding.  Plasma helps clot blood.  Other blood products are available for specialized needs, such as hemophilia or other clotting disorders. BEFORE THE TRANSFUSION  Who gives blood for transfusions?   Healthy volunteers who are fully evaluated to make sure their blood is safe. This is blood bank blood. Transfusion therapy is the safest it has ever been in the practice of medicine. Before blood is taken from a donor, a complete history is taken to make sure that person has no history of diseases nor engages in risky social behavior (examples are intravenous drug use or sexual activity with multiple partners). The donor's travel history is screened to minimize risk of transmitting infections, such as malaria. The donated blood is tested for signs of infectious diseases, such as HIV and hepatitis. The blood is then tested to be sure it is compatible with you in order to minimize the chance of a transfusion reaction. If you or a relative donates blood, this is often done in anticipation of surgery and is not appropriate for emergency situations. It takes many days to process the donated blood. RISKS AND COMPLICATIONS Although transfusion therapy is very safe and saves many lives, the main dangers of transfusion include:   Getting an infectious disease.  Developing a transfusion reaction. This is an allergic reaction to something in the blood you were given. Every precaution is taken to prevent this. The decision to have a blood transfusion has been considered carefully by your caregiver before blood is given. Blood is not given unless the benefits outweigh the risks. AFTER THE TRANSFUSION  Right after receiving a blood transfusion, you will usually feel much better and more energetic. This is especially true if your red blood cells have gotten low (anemic). The transfusion raises the level of the red blood cells which carry oxygen, and this usually causes an energy  increase.  The nurse administering the transfusion will monitor you carefully for complications. HOME CARE INSTRUCTIONS  No special instructions are needed after a transfusion. You may find your energy is better. Speak with your caregiver about any limitations on activity for underlying diseases you may have. SEEK MEDICAL CARE IF:   Your condition is not improving after your transfusion.  You develop redness or irritation at the intravenous (IV) site. SEEK IMMEDIATE MEDICAL CARE IF:  Any of the following symptoms occur over the next 12 hours:  Shaking chills.  You have a temperature by mouth above 102 F (38.9 C), not controlled by medicine.  Chest, back, or muscle pain.  People around you feel you are not acting correctly or are confused.  Shortness of breath or difficulty breathing.  Dizziness and fainting.  You get a rash or develop hives.  You have a decrease in urine output.  Your urine turns a dark color or changes to pink, red, or brown. Any of the following symptoms occur over the next 10 days:  You have a temperature by mouth above 102 F (38.9 C), not controlled by medicine.  Shortness of breath.  Weakness after normal activity.  The white part of the eye turns yellow (jaundice).  You have a decrease in the amount of urine or are urinating less often.  Your urine turns a dark color or changes to pink, red, or brown. Document Released: 08/18/2000 Document Revised: 11/13/2011 Document Reviewed: 04/06/2008 ExitCare Patient Information 2014 [place].  _______________________________________________________________________  Incentive Spirometer  An incentive spirometer is a tool that can help keep your lungs clear and active. This tool measures how well you are filling your lungs with each breath. Taking long deep breaths may help reverse or decrease the chance of developing breathing (pulmonary) problems (especially infection) following:  A long  period of time when you are unable to move or be active. BEFORE THE PROCEDURE   If the spirometer includes an indicator to show your best effort, your nurse or respiratory therapist will set it to a desired goal.  If possible, sit up straight or lean slightly forward. Try not to slouch.  Hold the incentive spirometer in an upright position. INSTRUCTIONS FOR USE  1. Sit on the edge of your bed if possible, or sit up as far as you can in bed or on a chair. 2. Hold the incentive spirometer in an upright position. 3. Breathe out normally. 4. Place the mouthpiece in your mouth and seal your lips tightly around it. 5. Breathe in slowly and as deeply as possible, raising the piston or the ball toward the top of the column. 6. Hold your breath for 3-5 seconds or for as long as possible. Allow the piston or ball to fall to the bottom of the column. 7. Remove the mouthpiece from your mouth and breathe out normally. 8. Rest for a few seconds and repeat Steps 1 through 7 at least 10 times every 1-2 hours when you are awake. Take your time and take a few normal breaths between deep breaths. 9. The spirometer may include an indicator to show your best effort. Use the indicator as a goal to work toward during each repetition. 10. After each set of 10 deep breaths, practice coughing to be sure your lungs are clear. If you have an incision (the cut made at the time of surgery), support your incision when coughing by placing a pillow or rolled up towels firmly against it. Once you are able to get  out of bed, walk around indoors and cough well. You may stop using the incentive spirometer when instructed by your caregiver.  RISKS AND COMPLICATIONS  Take your time so you do not get dizzy or light-headed.  If you are in pain, you may need to take or ask for pain medication before doing incentive spirometry. It is harder to take a deep breath if you are having pain. AFTER USE  Rest and breathe slowly and  easily.  It can be helpful to keep track of a log of your progress. Your caregiver can provide you with a simple table to help with this. If you are using the spirometer at home, follow these instructions: [place] IF:   You are having difficultly using the spirometer.  You have trouble using the spirometer as often as instructed.  Your pain medication is not giving enough relief while using the spirometer.  You develop fever of 100.5 F (38.1 C) or higher. SEEK IMMEDIATE MEDICAL CARE IF:   You cough up bloody sputum that had not been present before.  You develop fever of 102 F (38.9 C) or greater.  You develop worsening pain at or near the incision site. MAKE SURE YOU:   Understand these instructions.  Will watch your condition.  Will get help right away if you are not doing well or get worse. Document Released: 01/01/2007 Document Revised: 11/13/2011 Document Reviewed: 03/04/2007 [facility] Patient Information 2014 [place], [place].   ________________________________________________________________________

## 2018-06-12 ENCOUNTER — Encounter (HOSPITAL_COMMUNITY)
Admission: RE | Admit: 2018-06-12 | Discharge: 2018-06-12 | Disposition: A | Discharge status: Home / Self Care | Payer: Medicare Other | Source: Ambulatory Visit | Attending: Orthopedic Surgery | Admitting: Orthopedic Surgery

## 2018-06-12 ENCOUNTER — Other Ambulatory Visit: Payer: Self-pay

## 2018-06-12 ENCOUNTER — Encounter (HOSPITAL_COMMUNITY): Payer: Self-pay

## 2018-06-12 DIAGNOSIS — Z01812 Encounter for preprocedural laboratory examination: Secondary | ICD-10-CM | POA: Diagnosis not present

## 2018-06-12 HISTORY — DX: Nonrheumatic aortic (valve) stenosis: I35.0

## 2018-06-12 LAB — ABO/RH: ABO/RH(D): O NEG

## 2018-06-12 LAB — SURGICAL PCR SCREEN
MRSA, PCR: NEGATIVE
Staphylococcus aureus: NEGATIVE

## 2018-06-12 NOTE — Progress Notes (Signed)
Dr. Royce Macadamia anesthesia aware of pt. Moderate aortic stenosis.

## 2018-06-13 DIAGNOSIS — M5440 Lumbago with sciatica, unspecified side: Secondary | ICD-10-CM | POA: Diagnosis not present

## 2018-06-20 ENCOUNTER — Encounter (HOSPITAL_COMMUNITY): Payer: Self-pay

## 2018-06-20 ENCOUNTER — Inpatient Hospital Stay (HOSPITAL_COMMUNITY)
Admission: RE | Admit: 2018-06-20 | Discharge: 2018-06-22 | DRG: 470 | Disposition: A | Discharge status: Home Health | Payer: Medicare Other | Source: Ambulatory Visit | Attending: Orthopedic Surgery | Admitting: Orthopedic Surgery

## 2018-06-20 ENCOUNTER — Inpatient Hospital Stay (HOSPITAL_COMMUNITY): Payer: Medicare Other | Admitting: Anesthesiology

## 2018-06-20 ENCOUNTER — Other Ambulatory Visit: Payer: Self-pay

## 2018-06-20 ENCOUNTER — Encounter (HOSPITAL_COMMUNITY)
Admission: RE | Disposition: A | Discharge status: Home Health | Payer: Self-pay | Source: Ambulatory Visit | Attending: Orthopedic Surgery

## 2018-06-20 DIAGNOSIS — Z91048 Other nonmedicinal substance allergy status: Secondary | ICD-10-CM

## 2018-06-20 DIAGNOSIS — Z96659 Presence of unspecified artificial knee joint: Secondary | ICD-10-CM

## 2018-06-20 DIAGNOSIS — I10 Essential (primary) hypertension: Secondary | ICD-10-CM | POA: Diagnosis present

## 2018-06-20 DIAGNOSIS — I2510 Atherosclerotic heart disease of native coronary artery without angina pectoris: Secondary | ICD-10-CM | POA: Diagnosis present

## 2018-06-20 DIAGNOSIS — G8918 Other acute postprocedural pain: Secondary | ICD-10-CM | POA: Diagnosis not present

## 2018-06-20 DIAGNOSIS — Z981 Arthrodesis status: Secondary | ICD-10-CM

## 2018-06-20 DIAGNOSIS — R011 Cardiac murmur, unspecified: Secondary | ICD-10-CM | POA: Diagnosis present

## 2018-06-20 DIAGNOSIS — D649 Anemia, unspecified: Secondary | ICD-10-CM | POA: Diagnosis present

## 2018-06-20 DIAGNOSIS — E039 Hypothyroidism, unspecified: Secondary | ICD-10-CM | POA: Diagnosis present

## 2018-06-20 DIAGNOSIS — E785 Hyperlipidemia, unspecified: Secondary | ICD-10-CM | POA: Diagnosis present

## 2018-06-20 DIAGNOSIS — Z7951 Long term (current) use of inhaled steroids: Secondary | ICD-10-CM

## 2018-06-20 DIAGNOSIS — Z90710 Acquired absence of both cervix and uterus: Secondary | ICD-10-CM | POA: Diagnosis not present

## 2018-06-20 DIAGNOSIS — M170 Bilateral primary osteoarthritis of knee: Secondary | ICD-10-CM | POA: Diagnosis present

## 2018-06-20 DIAGNOSIS — M797 Fibromyalgia: Secondary | ICD-10-CM | POA: Diagnosis present

## 2018-06-20 DIAGNOSIS — E669 Obesity, unspecified: Secondary | ICD-10-CM | POA: Diagnosis present

## 2018-06-20 DIAGNOSIS — Z6834 Body mass index (BMI) 34.0-34.9, adult: Secondary | ICD-10-CM | POA: Diagnosis not present

## 2018-06-20 DIAGNOSIS — Z886 Allergy status to analgesic agent status: Secondary | ICD-10-CM

## 2018-06-20 DIAGNOSIS — Z79890 Hormone replacement therapy (postmenopausal): Secondary | ICD-10-CM

## 2018-06-20 DIAGNOSIS — I350 Nonrheumatic aortic (valve) stenosis: Secondary | ICD-10-CM | POA: Diagnosis present

## 2018-06-20 DIAGNOSIS — M25562 Pain in left knee: Secondary | ICD-10-CM | POA: Diagnosis not present

## 2018-06-20 DIAGNOSIS — Z880 Allergy status to penicillin: Secondary | ICD-10-CM | POA: Diagnosis not present

## 2018-06-20 DIAGNOSIS — Z885 Allergy status to narcotic agent status: Secondary | ICD-10-CM

## 2018-06-20 DIAGNOSIS — Z96653 Presence of artificial knee joint, bilateral: Secondary | ICD-10-CM

## 2018-06-20 HISTORY — PX: TOTAL KNEE ARTHROPLASTY: SHX125

## 2018-06-20 LAB — TYPE AND SCREEN
ABO/RH(D): O NEG
ANTIBODY SCREEN: NEGATIVE

## 2018-06-20 SURGERY — ARTHROPLASTY, KNEE, BILATERAL, TOTAL
Anesthesia: General | Site: Knee | Laterality: Bilateral

## 2018-06-20 MED ORDER — FENTANYL CITRATE (PF) 100 MCG/2ML IJ SOLN
INTRAMUSCULAR | Status: AC
  Filled 2018-06-20: qty 2

## 2018-06-20 MED ORDER — DOCUSATE SODIUM 100 MG PO CAPS
100.0000 mg | ORAL_CAPSULE | Freq: Two times a day (BID) | ORAL | Status: DC
  Administered 2018-06-20 – 2018-06-21 (×3): 100 mg via ORAL
  Filled 2018-06-20 (×4): qty 1

## 2018-06-20 MED ORDER — METHOCARBAMOL 500 MG PO TABS: 500.0000 mg | ORAL_TABLET | Freq: Four times a day (QID) | ORAL | 0 refills | Status: DC | PRN

## 2018-06-20 MED ORDER — ALUM & MAG HYDROXIDE-SIMETH 200-200-20 MG/5ML PO SUSP: 15.0000 mL | ORAL | Status: DC | PRN

## 2018-06-20 MED ORDER — RIVAROXABAN 10 MG PO TABS: 10.0000 mg | ORAL_TABLET | Freq: Every day | ORAL | 0 refills | Status: DC

## 2018-06-20 MED ORDER — ONDANSETRON HCL 4 MG PO TABS: 4.0000 mg | ORAL_TABLET | Freq: Four times a day (QID) | ORAL | Status: DC | PRN

## 2018-06-20 MED ORDER — PROPOFOL 10 MG/ML IV BOLUS
INTRAVENOUS | Status: AC
  Filled 2018-06-20: qty 20

## 2018-06-20 MED ORDER — POLYETHYLENE GLYCOL 3350 17 G PO PACK: 17.0000 g | PACK | Freq: Two times a day (BID) | ORAL | 0 refills | Status: AC

## 2018-06-20 MED ORDER — VANCOMYCIN HCL IN DEXTROSE 1-5 GM/200ML-% IV SOLN
1000.0000 mg | Freq: Two times a day (BID) | INTRAVENOUS | Status: AC
  Administered 2018-06-20: 1000 mg via INTRAVENOUS
  Filled 2018-06-20: qty 200

## 2018-06-20 MED ORDER — METOCLOPRAMIDE HCL 5 MG/ML IJ SOLN: 5.0000 mg | Freq: Three times a day (TID) | INTRAMUSCULAR | Status: DC | PRN

## 2018-06-20 MED ORDER — RIVAROXABAN 10 MG PO TABS
10.0000 mg | ORAL_TABLET | ORAL | Status: DC
  Administered 2018-06-21 – 2018-06-22 (×2): 10 mg via ORAL
  Filled 2018-06-20 (×2): qty 1

## 2018-06-20 MED ORDER — SUGAMMADEX SODIUM 500 MG/5ML IV SOLN
INTRAVENOUS | Status: DC | PRN
  Administered 2018-06-20: 200 mg via INTRAVENOUS

## 2018-06-20 MED ORDER — SUGAMMADEX SODIUM 200 MG/2ML IV SOLN
INTRAVENOUS | Status: AC
  Filled 2018-06-20: qty 2

## 2018-06-20 MED ORDER — LOSARTAN POTASSIUM 50 MG PO TABS
100.0000 mg | ORAL_TABLET | Freq: Every day | ORAL | Status: DC
  Filled 2018-06-20 (×3): qty 2

## 2018-06-20 MED ORDER — DEXAMETHASONE SODIUM PHOSPHATE 10 MG/ML IJ SOLN
INTRAMUSCULAR | Status: AC
  Filled 2018-06-20: qty 1

## 2018-06-20 MED ORDER — DOCUSATE SODIUM 100 MG PO CAPS: 100.0000 mg | ORAL_CAPSULE | Freq: Two times a day (BID) | ORAL | 0 refills | Status: DC

## 2018-06-20 MED ORDER — MIDAZOLAM HCL 2 MG/2ML IJ SOLN
2.0000 mg | Freq: Once | INTRAMUSCULAR | Status: AC
  Administered 2018-06-20: 2 mg via INTRAVENOUS

## 2018-06-20 MED ORDER — LORATADINE 10 MG PO TABS: 10.0000 mg | ORAL_TABLET | Freq: Every day | ORAL | Status: DC | PRN

## 2018-06-20 MED ORDER — LACTATED RINGERS IV SOLN
INTRAVENOUS | Status: DC
  Administered 2018-06-20 (×2): via INTRAVENOUS
  Administered 2018-06-20: 1000 mL via INTRAVENOUS

## 2018-06-20 MED ORDER — HYDROCHLOROTHIAZIDE 12.5 MG PO CAPS
12.5000 mg | ORAL_CAPSULE | Freq: Every day | ORAL | Status: DC
  Administered 2018-06-20 – 2018-06-21 (×2): 12.5 mg via ORAL
  Filled 2018-06-20 (×3): qty 1

## 2018-06-20 MED ORDER — METHOCARBAMOL 500 MG PO TABS
500.0000 mg | ORAL_TABLET | Freq: Four times a day (QID) | ORAL | Status: DC | PRN
  Administered 2018-06-20 – 2018-06-22 (×5): 500 mg via ORAL
  Filled 2018-06-20 (×5): qty 1

## 2018-06-20 MED ORDER — MIDAZOLAM HCL 5 MG/5ML IJ SOLN
INTRAMUSCULAR | Status: DC | PRN
  Administered 2018-06-20: 2 mg via INTRAVENOUS

## 2018-06-20 MED ORDER — TRANEXAMIC ACID-NACL 1000-0.7 MG/100ML-% IV SOLN
1000.0000 mg | Freq: Once | INTRAVENOUS | Status: AC
  Administered 2018-06-20: 1000 mg via INTRAVENOUS
  Filled 2018-06-20: qty 100

## 2018-06-20 MED ORDER — FERROUS SULFATE 325 (65 FE) MG PO TABS
325.0000 mg | ORAL_TABLET | Freq: Two times a day (BID) | ORAL | Status: DC
  Administered 2018-06-20 – 2018-06-22 (×4): 325 mg via ORAL
  Filled 2018-06-20 (×4): qty 1

## 2018-06-20 MED ORDER — PHENOL 1.4 % MT LIQD: 1.0000 | OROMUCOSAL | Status: DC | PRN

## 2018-06-20 MED ORDER — ROCURONIUM BROMIDE 10 MG/ML (PF) SYRINGE
PREFILLED_SYRINGE | INTRAVENOUS | Status: AC
  Filled 2018-06-20: qty 10

## 2018-06-20 MED ORDER — DEXAMETHASONE SODIUM PHOSPHATE 10 MG/ML IJ SOLN
10.0000 mg | Freq: Once | INTRAMUSCULAR | Status: AC
  Administered 2018-06-20: 10 mg via INTRAVENOUS

## 2018-06-20 MED ORDER — 0.9 % SODIUM CHLORIDE (POUR BTL) OPTIME
TOPICAL | Status: DC | PRN
  Administered 2018-06-20: 1000 mL

## 2018-06-20 MED ORDER — MENTHOL 3 MG MT LOZG: 1.0000 | LOZENGE | OROMUCOSAL | Status: DC | PRN

## 2018-06-20 MED ORDER — ROPIVACAINE HCL 5 MG/ML IJ SOLN
INTRAMUSCULAR | Status: DC | PRN
  Administered 2018-06-20 (×2): 15 mL via PERINEURAL

## 2018-06-20 MED ORDER — SODIUM CHLORIDE 0.9 % IR SOLN
Status: DC | PRN
  Administered 2018-06-20: 3000 mL

## 2018-06-20 MED ORDER — ACETAMINOPHEN 10 MG/ML IV SOLN
INTRAVENOUS | Status: AC
  Administered 2018-06-20: 1000 mg
  Filled 2018-06-20: qty 100

## 2018-06-20 MED ORDER — GENTAMICIN SULFATE 40 MG/ML IJ SOLN
5.0000 mg/kg | Freq: Once | INTRAVENOUS | Status: AC
  Administered 2018-06-20: 330 mg via INTRAVENOUS
  Filled 2018-06-20: qty 8.25

## 2018-06-20 MED ORDER — HYDROMORPHONE HCL 1 MG/ML IJ SOLN
INTRAMUSCULAR | Status: AC
  Filled 2018-06-20: qty 2

## 2018-06-20 MED ORDER — PROPOFOL 10 MG/ML IV BOLUS
INTRAVENOUS | Status: DC | PRN
  Administered 2018-06-20: 180 mg via INTRAVENOUS

## 2018-06-20 MED ORDER — ONDANSETRON HCL 4 MG/2ML IJ SOLN: 4.0000 mg | Freq: Four times a day (QID) | INTRAMUSCULAR | Status: DC | PRN

## 2018-06-20 MED ORDER — METOPROLOL SUCCINATE ER 25 MG PO TB24
25.0000 mg | ORAL_TABLET | Freq: Every day | ORAL | Status: DC
  Administered 2018-06-21 – 2018-06-22 (×2): 25 mg via ORAL
  Filled 2018-06-20 (×2): qty 1

## 2018-06-20 MED ORDER — KETOROLAC TROMETHAMINE 30 MG/ML IJ SOLN
INTRAMUSCULAR | Status: DC | PRN
  Administered 2018-06-20 (×2): 15 mg

## 2018-06-20 MED ORDER — HYDROMORPHONE HCL 1 MG/ML IJ SOLN: 0.5000 mg | INTRAMUSCULAR | Status: DC | PRN

## 2018-06-20 MED ORDER — CHLORHEXIDINE GLUCONATE 4 % EX LIQD: 60.0000 mL | Freq: Once | CUTANEOUS | Status: DC

## 2018-06-20 MED ORDER — SODIUM CHLORIDE 0.9 % IJ SOLN
INTRAMUSCULAR | Status: AC
  Filled 2018-06-20: qty 10

## 2018-06-20 MED ORDER — FENTANYL CITRATE (PF) 100 MCG/2ML IJ SOLN
INTRAMUSCULAR | Status: DC | PRN
  Administered 2018-06-20 (×4): 50 ug via INTRAVENOUS

## 2018-06-20 MED ORDER — SODIUM CHLORIDE 0.9 % IV SOLN
INTRAVENOUS | Status: DC
  Administered 2018-06-20 – 2018-06-21 (×2): via INTRAVENOUS

## 2018-06-20 MED ORDER — TRANEXAMIC ACID-NACL 1000-0.7 MG/100ML-% IV SOLN
1000.0000 mg | INTRAVENOUS | Status: AC
  Administered 2018-06-20: 1000 mg via INTRAVENOUS
  Filled 2018-06-20: qty 100

## 2018-06-20 MED ORDER — ROCURONIUM BROMIDE 10 MG/ML (PF) SYRINGE
PREFILLED_SYRINGE | INTRAVENOUS | Status: DC | PRN
  Administered 2018-06-20: 50 mg via INTRAVENOUS

## 2018-06-20 MED ORDER — LIDOCAINE 2% (20 MG/ML) 5 ML SYRINGE
INTRAMUSCULAR | Status: AC
  Filled 2018-06-20: qty 5

## 2018-06-20 MED ORDER — POLYETHYLENE GLYCOL 3350 17 G PO PACK
17.0000 g | PACK | Freq: Two times a day (BID) | ORAL | Status: DC
  Administered 2018-06-20 – 2018-06-21 (×3): 17 g via ORAL
  Filled 2018-06-20 (×3): qty 1

## 2018-06-20 MED ORDER — METHOCARBAMOL 500 MG IVPB - SIMPLE MED
INTRAVENOUS | Status: AC
  Filled 2018-06-20: qty 50

## 2018-06-20 MED ORDER — HYDROMORPHONE HCL 1 MG/ML IJ SOLN
INTRAMUSCULAR | Status: AC
  Filled 2018-06-20: qty 1

## 2018-06-20 MED ORDER — BUPIVACAINE HCL (PF) 0.25 % IJ SOLN
INTRAMUSCULAR | Status: DC | PRN
  Administered 2018-06-20 (×2): 15 mL

## 2018-06-20 MED ORDER — LEVOTHYROXINE SODIUM 88 MCG PO TABS
88.0000 ug | ORAL_TABLET | Freq: Every day | ORAL | Status: DC
  Administered 2018-06-21 – 2018-06-22 (×2): 88 ug via ORAL
  Filled 2018-06-20 (×2): qty 1

## 2018-06-20 MED ORDER — FERROUS SULFATE 325 (65 FE) MG PO TABS: 325.0000 mg | ORAL_TABLET | Freq: Three times a day (TID) | ORAL | 3 refills | Status: DC

## 2018-06-20 MED ORDER — HYDROCODONE-ACETAMINOPHEN 7.5-325 MG PO TABS
1.0000 | ORAL_TABLET | ORAL | Status: DC | PRN
  Administered 2018-06-20 (×2): 1 via ORAL
  Administered 2018-06-21 – 2018-06-22 (×6): 2 via ORAL
  Filled 2018-06-20 (×8): qty 2

## 2018-06-20 MED ORDER — METHOCARBAMOL 500 MG IVPB - SIMPLE MED
500.0000 mg | Freq: Four times a day (QID) | INTRAVENOUS | Status: DC | PRN
  Administered 2018-06-20: 500 mg via INTRAVENOUS
  Filled 2018-06-20: qty 50

## 2018-06-20 MED ORDER — HYDROCODONE-ACETAMINOPHEN 7.5-325 MG PO TABS: 1.0000 | ORAL_TABLET | ORAL | 0 refills | Status: DC | PRN

## 2018-06-20 MED ORDER — PROMETHAZINE HCL 25 MG/ML IJ SOLN: 6.2500 mg | INTRAMUSCULAR | Status: DC | PRN

## 2018-06-20 MED ORDER — FLUTICASONE PROPIONATE 50 MCG/ACT NA SUSP
1.0000 | Freq: Every day | NASAL | Status: DC | PRN
  Filled 2018-06-20: qty 16

## 2018-06-20 MED ORDER — HYDROMORPHONE HCL 1 MG/ML IJ SOLN
0.5000 mg | INTRAMUSCULAR | Status: DC | PRN
  Administered 2018-06-20: 2 mg via INTRAVENOUS
  Filled 2018-06-20: qty 2

## 2018-06-20 MED ORDER — LACTATED RINGERS IV SOLN: INTRAVENOUS | Status: DC

## 2018-06-20 MED ORDER — ONDANSETRON HCL 4 MG/2ML IJ SOLN
INTRAMUSCULAR | Status: DC | PRN
  Administered 2018-06-20: 4 mg via INTRAVENOUS

## 2018-06-20 MED ORDER — MIDAZOLAM HCL 2 MG/2ML IJ SOLN
INTRAMUSCULAR | Status: AC
  Filled 2018-06-20: qty 2

## 2018-06-20 MED ORDER — LOSARTAN POTASSIUM-HCTZ 100-12.5 MG PO TABS: 1.0000 | ORAL_TABLET | Freq: Every evening | ORAL | Status: DC

## 2018-06-20 MED ORDER — CELECOXIB 200 MG PO CAPS
200.0000 mg | ORAL_CAPSULE | Freq: Two times a day (BID) | ORAL | Status: DC
  Administered 2018-06-20 – 2018-06-22 (×4): 200 mg via ORAL
  Filled 2018-06-20 (×4): qty 1

## 2018-06-20 MED ORDER — BISACODYL 10 MG RE SUPP: 10.0000 mg | Freq: Every day | RECTAL | Status: DC | PRN

## 2018-06-20 MED ORDER — KETOROLAC TROMETHAMINE 30 MG/ML IJ SOLN
INTRAMUSCULAR | Status: AC
  Filled 2018-06-20: qty 2

## 2018-06-20 MED ORDER — MAGNESIUM CITRATE PO SOLN: 1.0000 | Freq: Once | ORAL | Status: DC | PRN

## 2018-06-20 MED ORDER — HYDROCODONE-ACETAMINOPHEN 5-325 MG PO TABS
1.0000 | ORAL_TABLET | ORAL | Status: DC | PRN
  Administered 2018-06-20 – 2018-06-22 (×3): 2 via ORAL
  Filled 2018-06-20 (×4): qty 2

## 2018-06-20 MED ORDER — VANCOMYCIN HCL IN DEXTROSE 1-5 GM/200ML-% IV SOLN
1000.0000 mg | INTRAVENOUS | Status: AC
  Administered 2018-06-20: 1000 mg via INTRAVENOUS
  Filled 2018-06-20: qty 200

## 2018-06-20 MED ORDER — METOCLOPRAMIDE HCL 5 MG PO TABS: 5.0000 mg | ORAL_TABLET | Freq: Three times a day (TID) | ORAL | Status: DC | PRN

## 2018-06-20 MED ORDER — HYDROMORPHONE HCL 1 MG/ML IJ SOLN
0.5000 mg | INTRAMUSCULAR | Status: AC | PRN
  Administered 2018-06-20 (×2): 0.5 mg via INTRAVENOUS

## 2018-06-20 MED ORDER — BUPIVACAINE HCL (PF) 0.25 % IJ SOLN
INTRAMUSCULAR | Status: AC
  Filled 2018-06-20: qty 60

## 2018-06-20 MED ORDER — SODIUM CHLORIDE 0.9 % IJ SOLN
INTRAMUSCULAR | Status: DC | PRN
  Administered 2018-06-20 (×2): 15 mL

## 2018-06-20 MED ORDER — DIPHENHYDRAMINE HCL 12.5 MG/5ML PO ELIX: 12.5000 mg | ORAL_SOLUTION | ORAL | Status: DC | PRN

## 2018-06-20 MED ORDER — ASPIRIN 81 MG PO CHEW: 81.0000 mg | CHEWABLE_TABLET | Freq: Two times a day (BID) | ORAL | 0 refills | Status: AC

## 2018-06-20 MED ORDER — LIDOCAINE 2% (20 MG/ML) 5 ML SYRINGE
INTRAMUSCULAR | Status: DC | PRN
  Administered 2018-06-20: 100 mg via INTRAVENOUS

## 2018-06-20 MED ORDER — SODIUM CHLORIDE 0.9 % IJ SOLN
INTRAMUSCULAR | Status: AC
  Filled 2018-06-20: qty 50

## 2018-06-20 MED ORDER — MEPERIDINE HCL 50 MG/ML IJ SOLN: 6.2500 mg | INTRAMUSCULAR | Status: DC | PRN

## 2018-06-20 MED ORDER — DEXAMETHASONE SODIUM PHOSPHATE 10 MG/ML IJ SOLN
10.0000 mg | Freq: Once | INTRAMUSCULAR | Status: AC
  Administered 2018-06-21: 10 mg via INTRAVENOUS
  Filled 2018-06-20: qty 1

## 2018-06-20 MED ORDER — HYDROMORPHONE HCL 1 MG/ML IJ SOLN
0.2500 mg | INTRAMUSCULAR | Status: DC | PRN
  Administered 2018-06-20 (×4): 0.5 mg via INTRAVENOUS

## 2018-06-20 MED ORDER — ONDANSETRON HCL 4 MG/2ML IJ SOLN
INTRAMUSCULAR | Status: AC
  Filled 2018-06-20: qty 2

## 2018-06-20 MED ORDER — ACETAMINOPHEN 325 MG PO TABS: 325.0000 mg | ORAL_TABLET | Freq: Four times a day (QID) | ORAL | Status: DC | PRN

## 2018-06-20 MED ORDER — FENTANYL CITRATE (PF) 100 MCG/2ML IJ SOLN
100.0000 ug | Freq: Once | INTRAMUSCULAR | Status: AC
  Administered 2018-06-20: 50 ug via INTRAVENOUS

## 2018-06-20 MED ORDER — STERILE WATER FOR IRRIGATION IR SOLN
Status: DC | PRN
  Administered 2018-06-20: 2000 mL

## 2018-06-20 SURGICAL SUPPLY — 67 items
ADH SKN CLS APL DERMABOND .7 (GAUZE/BANDAGES/DRESSINGS) ×2
ATTUNE MED ANAT PAT 35 KNEE (Knees) ×2 IMPLANT
ATTUNE MED ANAT PAT 35MM KNEE (Knees) ×2 IMPLANT
ATTUNE PSFEM LTSZ6 NARCEM KNEE (Femur) ×2 IMPLANT
ATTUNE PSFEM RTSZ5 NARCEM KNEE (Femur) ×2 IMPLANT
ATTUNE PSRP INSR SZ5 6 KNEE (Insert) ×1 IMPLANT
ATTUNE PSRP INSR SZ5 6MM KNEE (Insert) ×1 IMPLANT
ATTUNE PSRP INSR SZ6 7 KNEE (Insert) ×1 IMPLANT
ATTUNE PSRP INSR SZ6 7MM KNEE (Insert) ×1 IMPLANT
BAG SPEC THK2 15X12 ZIP CLS (MISCELLANEOUS)
BAG ZIPLOCK 12X15 (MISCELLANEOUS) IMPLANT
BANDAGE ELASTIC 6 VELCRO ST LF (GAUZE/BANDAGES/DRESSINGS) ×4 IMPLANT
BANDAGE ESMARK 6X9 LF (GAUZE/BANDAGES/DRESSINGS) ×1 IMPLANT
BASE TIBIAL ROT PLAT SZ 5 KNEE (Knees) IMPLANT
BLADE SAW SGTL 11.0X1.19X90.0M (BLADE) IMPLANT
BLADE SAW SGTL 13.0X1.19X90.0M (BLADE) ×6 IMPLANT
BLADE SURG SZ10 CARB STEEL (BLADE) ×6 IMPLANT
BNDG CMPR 9X6 STRL LF SNTH (GAUZE/BANDAGES/DRESSINGS) ×1
BNDG COHESIVE 6X5 TAN STRL LF (GAUZE/BANDAGES/DRESSINGS) ×3 IMPLANT
BNDG ESMARK 6X9 LF (GAUZE/BANDAGES/DRESSINGS) ×3
BOWL SMART MIX CTS (DISPOSABLE) ×6 IMPLANT
BSPLAT TIB 5 CMNT ROT PLAT STR (Knees) ×2 IMPLANT
CEMENT HV SMART SET (Cement) ×12 IMPLANT
COVER SURGICAL LIGHT HANDLE (MISCELLANEOUS) ×3 IMPLANT
COVER WAND RF STERILE (DRAPES) IMPLANT
CUFF TOURN SGL QUICK 34 (TOURNIQUET CUFF) ×6
CUFF TRNQT CYL 34X4X40X1 (TOURNIQUET CUFF) ×2 IMPLANT
DECANTER SPIKE VIAL GLASS SM (MISCELLANEOUS) ×5 IMPLANT
DERMABOND ADVANCED (GAUZE/BANDAGES/DRESSINGS) ×4
DERMABOND ADVANCED .7 DNX12 (GAUZE/BANDAGES/DRESSINGS) ×2 IMPLANT
DRAPE EXTREMITY BILATERAL (DRAPES) ×3 IMPLANT
DRAPE INCISE IOBAN 66X45 STRL (DRAPES) ×3 IMPLANT
DRAPE SHEET LG 3/4 BI-LAMINATE (DRAPES) ×4 IMPLANT
DRAPE U-SHAPE 47X51 STRL (DRAPES) ×9 IMPLANT
DRESSING AQUACEL AG SP 3.5X10 (GAUZE/BANDAGES/DRESSINGS) ×2 IMPLANT
DRSG AQUACEL AG SP 3.5X10 (GAUZE/BANDAGES/DRESSINGS) ×6
DURAPREP 26ML APPLICATOR (WOUND CARE) ×6 IMPLANT
ELECT CAUTERY BLADE 6.4 (BLADE) ×6 IMPLANT
ELECT CAUTERY BLADE TIP 2.5 (TIP) ×3
ELECT REM PT RETURN 15FT ADLT (MISCELLANEOUS) ×3 IMPLANT
ELECTRODE CAUTERY BLDE TIP 2.5 (TIP) ×1 IMPLANT
GLOVE BIOGEL M 7.0 STRL (GLOVE) IMPLANT
GLOVE BIOGEL PI IND STRL 7.5 (GLOVE) ×1 IMPLANT
GLOVE BIOGEL PI IND STRL 8.5 (GLOVE) ×1 IMPLANT
GLOVE BIOGEL PI INDICATOR 7.5 (GLOVE) ×2
GLOVE BIOGEL PI INDICATOR 8.5 (GLOVE) ×2
GLOVE ECLIPSE 8.0 STRL XLNG CF (GLOVE) ×6 IMPLANT
GLOVE ORTHO TXT STRL SZ7.5 (GLOVE) ×6 IMPLANT
GOWN STRL REUS W/TWL 2XL LVL3 (GOWN DISPOSABLE) ×3 IMPLANT
GOWN STRL REUS W/TWL LRG LVL3 (GOWN DISPOSABLE) ×3 IMPLANT
HANDPIECE INTERPULSE COAX TIP (DISPOSABLE) ×3
MANIFOLD NEPTUNE II (INSTRUMENTS) ×3 IMPLANT
NS IRRIG 1000ML POUR BTL (IV SOLUTION) ×3 IMPLANT
PACK TOTAL KNEE CUSTOM (KITS) ×3 IMPLANT
SET HNDPC FAN SPRY TIP SCT (DISPOSABLE) ×1 IMPLANT
SET PAD KNEE POSITIONER (MISCELLANEOUS) ×6 IMPLANT
SPONGE LAP 18X18 RF (DISPOSABLE) IMPLANT
STOCKINETTE 8 INCH (MISCELLANEOUS) ×3 IMPLANT
SUT MNCRL AB 4-0 PS2 18 (SUTURE) ×6 IMPLANT
SUT STRATAFIX PDS+ 0 24IN (SUTURE) ×6 IMPLANT
SUT VIC AB 1 CT1 36 (SUTURE) ×6 IMPLANT
SUT VIC AB 2-0 CT1 27 (SUTURE) ×12
SUT VIC AB 2-0 CT1 TAPERPNT 27 (SUTURE) ×4 IMPLANT
TIBIAL BASE ROT PLAT SZ 5 KNEE (Knees) ×6 IMPLANT
WATER STERILE IRR 1000ML POUR (IV SOLUTION) ×6 IMPLANT
WRAP KNEE MAXI GEL POST OP (GAUZE/BANDAGES/DRESSINGS) ×6 IMPLANT
YANKAUER SUCT BULB TIP 10FT TU (MISCELLANEOUS) ×2 IMPLANT

## 2018-06-20 NOTE — Transfer of Care (Signed)
Immediate Anesthesia Transfer of Care Note  Patient: Erica Calhoun  Procedure(s) Performed: TOTAL KNEE BILATERAL (Bilateral Knee)  Patient Location: PACU  Anesthesia Type:General and Regional  Level of Consciousness: awake, alert  and oriented  Airway & Oxygen Therapy: Patient Spontanous Breathing and Patient connected to face mask oxygen  Post-op Assessment: Report given to RN and Post -op Vital signs reviewed and stable  Post vital signs: Reviewed and stable  Last Vitals:  Vitals Value Taken Time  BP    Temp    Pulse    Resp    SpO2      Last Pain:  Vitals:   06/20/18 0852  TempSrc:   PainSc: 0-No pain      Patients Stated Pain Goal: 4 (12/78/71 8367)  Complications: No apparent anesthesia complications

## 2018-06-20 NOTE — Anesthesia Preprocedure Evaluation (Addendum)
Anesthesia Evaluation  Patient identified by MRN, date of birth, ID band Patient awake    Reviewed: Allergy & Precautions, NPO status , Patient's Chart, lab work & pertinent test results  Airway Mallampati: II  TM Distance: >3 FB Neck ROM: Full    Dental  (+) Teeth Intact, Dental Advisory Given   Pulmonary    breath sounds clear to auscultation       Cardiovascular hypertension, Pt. on home beta blockers and Pt. on medications + CAD  + Valvular Problems/Murmurs AS  Rhythm:Regular Rate:Normal     Neuro/Psych    GI/Hepatic negative GI ROS, Neg liver ROS,   Endo/Other  Hypothyroidism   Renal/GU negative Renal ROS     Musculoskeletal  (+) Arthritis , Fibromyalgia -  Abdominal Normal abdominal exam  (+)   Peds  Hematology negative hematology ROS (+)   Anesthesia Other Findings - HLD  Reproductive/Obstetrics                            Lab Results  Component Value Date   WBC 8.7 01/24/2018   HGB 12.6 01/24/2018   HCT 35.4 (L) 01/24/2018   MCV 84.5 01/24/2018   PLT 172 01/24/2018   Lab Results  Component Value Date   CREATININE 0.64 01/24/2018   BUN 18 01/24/2018   NA 135 01/24/2018   K 4.1 01/24/2018   CL 99 (L) 01/24/2018   CO2 25 01/24/2018   No results found for: INR, PROTIME  Echo: - Left ventricle: The cavity size was normal. There was mild focal   basal hypertrophy of the septum. Systolic function was normal.   The estimated ejection fraction was in the range of 60% to 65%.   Wall motion was normal; there were no regional wall motion   abnormalities. Doppler parameters are consistent with abnormal   left ventricular relaxation (grade 1 diastolic dysfunction).   Doppler parameters are consistent with high ventricular filling   pressure. - Aortic valve: Valve mobility was restricted. There was moderate   stenosis. There was mild regurgitation. Peak velocity (S): 306   cm/s.  Mean gradient (S): 23 mm Hg. - Aorta: Ascending aortic diameter: 41 mm (S). - Ascending aorta: The ascending aorta was mildly dilated. - Mitral valve: Calcified annulus. There was mild regurgitation. - Right ventricle: The cavity size was normal. Wall thickness was   normal. - Tricuspid valve: There was mild regurgitation. - Pulmonic valve: There was trivial regurgitation.  EKG: normal sinus rhythm.  Anesthesia Physical Anesthesia Plan  ASA: II  Anesthesia Plan: General   Post-op Pain Management:  Regional for Post-op pain   Induction: Intravenous  PONV Risk Score and Plan: 3 and Ondansetron, Midazolam and Dexamethasone  Airway Management Planned: Oral ETT  Additional Equipment: None  Intra-op Plan:   Post-operative Plan: Extubation in OR  Informed Consent: I have reviewed the patients History and Physical, chart, labs and discussed the procedure including the risks, benefits and alternatives for the proposed anesthesia with the patient or authorized representative who has indicated his/her understanding and acceptance.   Dental advisory given  Plan Discussed with: CRNA  Anesthesia Plan Comments:       Anesthesia Quick Evaluation

## 2018-06-20 NOTE — Evaluation (Signed)
Physical Therapy Evaluation Patient Details Name: Erica Calhoun MRN: [id number] DOB: January 01, 1951 Today's Date: 06/20/2018   History of Present Illness  67 YO female s/p bilateal TKR on 06/20/18. PMH includes arthritis, HTN, heart murmurs, aortic stenosis, cervical fusion 2018, lumbar fusion 2019, fibromyalgia, PNA.   Clinical Impression   Pt presents with bilateral knee pain, difficulty performing mobility tasks, and decreased tolerance for ambulation due to pain. Pt to benefit from acute PT to address deficits. Pt ambulated 35 ft with RW with min guard assist today, chair follow for safety. PT to continue to follow and will progress mobility as able.     Follow Up Recommendations Follow surgeon's recommendation for DC plan and follow-up therapies;Supervision for mobility/OOB(OPPT)    Equipment Recommendations  None recommended by PT    Recommendations for Other Services       Precautions / Restrictions Precautions Precautions: Fall Restrictions Weight Bearing Restrictions: No Other Position/Activity Restrictions: WBAT       Mobility  Bed Mobility Overal bed mobility: Needs Assistance Bed Mobility: Supine to Sit     Supine to sit: Min assist;HOB elevated     General bed mobility comments: Min assist for LE management, pt with increased time and effort to scoot to EOB.   Transfers Overall transfer level: Needs assistance Equipment used: Rolling walker (2 wheeled) Transfers: Sit to/from Stand Sit to Stand: Min assist;From elevated surface;+2 safety/equipment         General transfer comment: Min assist for power up, increased time to rise and come to full erect standing. Pt with no buckling in standing, in neither R nor L knee. Pt with brief increased pain with standing.   Ambulation/Gait Ambulation/Gait assistance: Min guard;+2 safety/equipment(chair follow) Gait Distance (Feet): 35 Feet Assistive device: Rolling walker (2 wheeled) Gait Pattern/deviations:  Step-to pattern;Decreased stride length;Antalgic Gait velocity: decr    General Gait Details: Heavy use of RW to offweight LEs. Verbal cuing for step-to pattern, as pt eager to perform step-through gait. Min guard for safety.   Stairs            Wheelchair Mobility    Modified Rankin (Stroke Patients Only)       Balance Overall balance assessment: Mild deficits observed, not formally tested                                           Pertinent Vitals/Pain Pain Assessment: 0-10 Pain Score: 5  Pain Location: bilateral knees  Pain Descriptors / Indicators: Sore Pain Intervention(s): Limited activity within patient's tolerance;Repositioned;Monitored during session;Ice applied    Home Living Family/patient expects to be discharged to:: Private residence Living Arrangements: Spouse/significant other Available Help at Discharge: Family;Available PRN/intermittently Type of Home: Mobile home Home Access: Stairs to enter Entrance Stairs-Rails: Left Entrance Stairs-Number of Steps: 4 Home Layout: One level Home Equipment: Walker - 2 wheels      Prior Function Level of Independence: Independent               Hand Dominance   Dominant Hand: Right    Extremity/Trunk Assessment   Upper Extremity Assessment Upper Extremity Assessment: Overall WFL for tasks assessed    Lower Extremity Assessment Lower Extremity Assessment: Overall WFL for tasks assessed;RLE deficits/detail;LLE deficits/detail RLE Deficits / Details: bilaterally, suspected post-surgical weakness; able to perform quad set to 0* extension, ankle pumps, SLR with <10 degrees quad lag  RLE Sensation:  WNL LLE Deficits / Details: bilaterally, suspected post-surgical weakness; able to perform quad set to 0* extension, ankle pumps, SLR with <10 degrees quad lag  LLE Sensation: WNL    Cervical / Trunk Assessment Cervical / Trunk Assessment: Normal  Communication   Communication: No  difficulties  Cognition Arousal/Alertness: Awake/alert Behavior During Therapy: WFL for tasks assessed/performed Overall Cognitive Status: Within Functional Limits for tasks assessed                                        General Comments      Exercises Total Joint Exercises Ankle Circles/Pumps: AROM;Both;5 reps;Seated   Assessment/Plan    PT Assessment Patient needs continued PT services  PT Problem List Decreased strength;Pain;Decreased range of motion;Decreased activity tolerance;Decreased knowledge of use of DME;Decreased balance;Decreased safety awareness;Decreased mobility       PT Treatment Interventions DME instruction;Therapeutic activities;Gait training;Therapeutic exercise;Patient/family education;Stair training;Balance training;Functional mobility training    PT Goals (Current goals can be found in the Care Plan section)  Acute Rehab PT Goals PT Goal Formulation: With patient Time For Goal Achievement: 07/04/18 Potential to Achieve Goals: Good    Frequency 7X/week   Barriers to discharge        Co-evaluation               AM-PAC PT "6 Clicks" Daily Activity  Outcome Measure Difficulty turning over in bed (including adjusting bedclothes, sheets and blankets)?: Unable Difficulty moving from lying on back to sitting on the side of the bed? : Unable Difficulty sitting down on and standing up from a chair with arms (e.g., wheelchair, bedside commode, etc,.)?: Unable Help needed moving to and from a bed to chair (including a wheelchair)?: A Little Help needed walking in hospital room?: A Little Help needed climbing 3-5 steps with a railing? : A Lot 6 Click Score: 11    End of Session Equipment Utilized During Treatment: Gait belt Activity Tolerance: Patient tolerated treatment well;Patient limited by pain Patient left: in chair;with chair alarm set;with call bell/phone within reach;with SCD's reapplied Nurse Communication: Mobility  status PT Visit Diagnosis: Other abnormalities of gait and mobility (R26.89);Difficulty in walking, not elsewhere classified (R26.2)    Time: 4580-9983 PT Time Calculation (min) (ACUTE ONLY): 25 min   Charges:   PT Evaluation $PT Eval Low Complexity: 1 Low PT Treatments $Gait Training: 8-22 mins      Julien Girt, PT Acute Rehabilitation Services Pager [phone]  Office [phone]   Erica Calhoun 06/20/2018, 6:28 PM

## 2018-06-20 NOTE — Anesthesia Procedure Notes (Signed)
Anesthesia Regional Block: Adductor canal block   Pre-Anesthetic Checklist: ,, timeout performed, Correct Patient, Correct Site, Correct Laterality, Correct Procedure, Correct Position, site marked, Risks and benefits discussed,  Surgical consent,  Pre-op evaluation,  At surgeon's request and post-op pain management  Laterality: Left  Prep: chloraprep       Needles:  Injection technique: Single-shot  Needle Type: Echogenic Stimulator Needle     Needle Length: 9cm  Needle Gauge: 21     Additional Needles:   Procedures:,,,, ultrasound used (permanent image in chart),,,,  Narrative:  Start time: 06/20/2018 8:35 AM End time: 06/20/2018 8:40 AM Injection made incrementally with aspirations every 5 mL.  Performed by: Personally  Anesthesiologist: Effie Berkshire, MD  Additional Notes: Patient tolerated the procedure well. Local anesthetic introduced in an incremental fashion under minimal resistance after negative aspirations. No paresthesias were elicited. After completion of the procedure, no acute issues were identified and patient continued to be monitored by RN.

## 2018-06-20 NOTE — Anesthesia Procedure Notes (Signed)
Procedure Name: Intubation Date/Time: 06/20/2018 9:59 AM Performed by: Aanshi Batchelder D, CRNA Pre-anesthesia Checklist: Patient identified, Emergency Drugs available, Suction available and Patient being monitored Patient Re-evaluated:Patient Re-evaluated prior to induction Oxygen Delivery Method: Circle system utilized Preoxygenation: Pre-oxygenation with 100% oxygen Induction Type: IV induction Ventilation: Mask ventilation without difficulty Laryngoscope Size: Mac and 4 Grade View: Grade II Tube type: Oral Tube size: 7.5 mm Number of attempts: 1 Airway Equipment and Method: Stylet Placement Confirmation: ETT inserted through vocal cords under direct vision,  positive ETCO2 and breath sounds checked- equal and bilateral Secured at: 21 cm Tube secured with: Tape Dental Injury: Teeth and Oropharynx as per pre-operative assessment

## 2018-06-20 NOTE — Anesthesia Postprocedure Evaluation (Signed)
Anesthesia Post Note  Patient: Erica Calhoun  Procedure(s) Performed: TOTAL KNEE BILATERAL (Bilateral Knee)     Patient location during evaluation: PACU Anesthesia Type: General and Regional Level of consciousness: awake and alert Pain management: pain level controlled Vital Signs Assessment: post-procedure vital signs reviewed and stable Respiratory status: spontaneous breathing, nonlabored ventilation, respiratory function stable and patient connected to nasal cannula oxygen Cardiovascular status: blood pressure returned to baseline and stable Postop Assessment: no apparent nausea or vomiting Anesthetic complications: no    Last Vitals:  Vitals:   06/20/18 1345 06/20/18 1416  BP: 119/88 133/70  Pulse: 65 (!) 54  Resp: 16 14  Temp: 36.6 C 36.4 C  SpO2: 100% 100%    Last Pain:  Vitals:   06/20/18 1416  TempSrc: Oral  PainSc:                  Effie Berkshire

## 2018-06-20 NOTE — Anesthesia Procedure Notes (Signed)
Anesthesia Regional Block: Adductor canal block   Pre-Anesthetic Checklist: ,, timeout performed, Correct Patient, Correct Site, Correct Laterality, Correct Procedure, Correct Position, site marked, Risks and benefits discussed,  Surgical consent,  Pre-op evaluation,  At surgeon's request and post-op pain management  Laterality: Left  Prep: chloraprep       Needles:  Injection technique: Single-shot  Needle Type: Echogenic Stimulator Needle     Needle Length: 9cm  Needle Gauge: 21     Additional Needles:   Procedures:,,,, ultrasound used (permanent image in chart),,,,  Narrative:  Start time: 06/20/2018 8:30 AM End time: 06/20/2018 8:35 AM Injection made incrementally with aspirations every 5 mL.  Performed by: Personally  Anesthesiologist: Effie Berkshire, MD  Additional Notes: Patient tolerated the procedure well. Local anesthetic introduced in an incremental fashion under minimal resistance after negative aspirations. No paresthesias were elicited. After completion of the procedure, no acute issues were identified and patient continued to be monitored by RN.

## 2018-06-20 NOTE — Interval H&P Note (Signed)
History and Physical Interval Note:  06/20/2018 7:45 AM  Erica Calhoun  has presented today for surgery, with the diagnosis of Bilateral knee osteoarthritis  The various methods of treatment have been discussed with the patient and family. After consideration of risks, benefits and other options for treatment, the patient has consented to  Procedure(s) with comments: TOTAL KNEE BILATERAL (Bilateral) - 2.5 hrs as a surgical intervention .  The patient's history has been reviewed, patient examined, no change in status, stable for surgery.  I have reviewed the patient's chart and labs.  Questions were answered to the patient's satisfaction.     Mauri Pole

## 2018-06-20 NOTE — Op Note (Signed)
NAME:  Erica Calhoun RECORD NO.:  [id number]                             FACILITY:  [facility]      PHYSICIAN:  Pietro Cassis. Alvan Dame, M.D.  DATE OF BIRTH:  Jul 08, 1951      DATE OF PROCEDURE:  06/20/2018                                     OPERATIVE REPORT   This was a bilateral simultaneous TKR procedure.  Below are templated operative notes outlining components used and general technique.  I began on the left side.        PREOPERATIVE DIAGNOSIS:  Bilateral knee osteoarthritis.      POSTOPERATIVE DIAGNOSIS:  Bilateral knee osteoarthritis.      FINDINGS:  The patient was noted to have complete loss of cartilage and   bone-on-bone arthritis with associated osteophytes in the medial and patellofemoral compartments of   the knee with a significant synovitis and associated effusion.  The patient had failed months of conservative treatment including medications, injection therapy, activity modification.     PROCEDURE:  Left total knee replacement.      COMPONENTS USED:  DePuy Attune rotating platform posterior stabilized knee   system, a size 6N femur, 6 tibia, size 7 mm PS AOX insert, and 35 anatomic patellar   button.      SURGEON:  Pietro Cassis. Alvan Dame, M.D.      ASSISTANT:  Danae Orleans, PA-C.      ANESTHESIA:  General and Regional.      SPECIMENS:  None.      COMPLICATION:  None.      DRAINS:  None.  EBL: <150cc      TOURNIQUET TIME:  26 min at 250 mmHg     The patient was stable to the recovery room.      INDICATION FOR PROCEDURE:  Erica Calhoun is a 67 y.o. female patient of   mine.  The patient had been seen, evaluated, and treated for months conservatively in the   office with medication, activity modification, and injections.  The patient had    radiographic changes of bone-on-bone arthritis with endplate sclerosis and osteophytes noted.  Based on the radiographic changes and failed conservative measures, the patient   decided to proceed  with definitive treatment, total knee replacement.  Risks of infection, DVT, component failure, need for revision surgery, neurovascular injury were reviewed in the office setting.  The postop course was reviewed stressing the efforts to maximize post-operative satisfaction and function.  Consent was obtained for benefit of pain   relief.   We discussed the specific risks and benefits, pros and cons of bilateral versus staged arthroplasties.  She wished to proceed with simultaneous procedure.     PROCEDURE IN DETAIL:  The patient was brought to the operative theater.   Once adequate anesthesia, preoperative antibiotics, 1 gm of Vancomycin, Gentamycin,1 gm of Tranexamic Acid, and 10 mg of Decadron administered, the patient was positioned supine with a bilateral thigh tourniquets placed.   Both lower extremities were prepped and draped in sterile fashion.  A time-   out was performed identifying the patient, planned procedures, and the  appropriate extremity.      The both lower extremities were placed in the [facility] leg holders.  First, the left leg was   exsanguinated, tourniquet elevated to 250 mmHg.  A midline incision was   made followed by median parapatellar arthrotomy.  Following initial   exposure, attention was first directed to the patella.  Precut   measurement was noted to be 23 mm.  I resected down to 13-14 mm and used a   35 anatomic patellar button to restore patellar height as well as cover the cut surface.      The lug holes were drilled and a metal shim was placed to protect the   patella from retractors and saw blade during the procedure.      At this point, attention was now directed to the femur.  The femoral   canal was opened with a drill, irrigated to try to prevent fat emboli.  An   intramedullary rod was passed at 3 degrees valgus, 9 mm of bone was   resected off the distal femur.  Following this resection, the tibia was   subluxated anteriorly.  Using the  extramedullary guide, 2 mm of bone was resected off   the proximal medial tibia.  We confirmed the gap would be   stable medially and laterally with a size 5 spacer block as well as confirmed that the tibial cut was perpendicular in the coronal plane, checking with an alignment rod.      Once this was done, I sized the femur to be a size 6 in the anterior-   posterior dimension, chose a narrow component based on medial and   lateral dimension.  The size 6 rotation block was then pinned in   position anterior referenced using the C-clamp to set rotation.  The   anterior, posterior, and  chamfer cuts were made without difficulty nor   notching making certain that I was along the anterior cortex to help   with flexion gap stability.      The final box cut was made off the lateral aspect of distal femur.      At this point, the tibia was sized to be a size 6.  The size 6 tray was   then pinned in position through the medial third of the tubercle,   drilled, and keel punched.  Trial reduction was now carried with a 6 femur,  6 tibia, a size 6 then 7 mm PS insert, and the 35 anatomic patella botton.  The knee was brought to full extension with good flexion stability with the patella   tracking through the trochlea without application of pressure.  Given   all these findings the trial components removed.  Final components were   opened and cement was mixed.  The knee was irrigated with normal saline solution and pulse lavage.  The synovial lining was   then injected with one half of 30 cc of (0.25% Marcaine with epinephrine, 1 cc of Toradol and 30 cc of NS for a total of 61 cc).     Final implants were then cemented onto cleaned and dried cut surfaces of bone with the knee brought to extension with a size 7 mm PS trial insert.      Once the cement had fully cured, excess cement was removed   throughout the knee.  I confirmed that I was satisfied with the range of   motion and stability, and the  final size 7 mm PS  AOX insert was chosen.  It was   placed into the knee.      The tourniquet had been let down at 26 minutes.  No significant   hemostasis was required.  The extensor mechanism was then reapproximated using #1 Vicryl and #1 Stratafix sutures with the knee   in flexion.  The   remaining wound was closed with 2-0 Vicryl and running 4-0 Monocryl.   The knee was cleaned, dried, dressed sterilely using Dermabond and   Aquacel dressing.  The patient was then   brought to recovery room in stable condition, tolerating the procedure   well.   Please note that Physician Assistant, Danae Orleans, PA-C was present for the entirety of the case, and was utilized for pre-operative positioning, peri-operative retractor management, general facilitation of the procedure and for primary wound closure at the end of the case.              Pietro Cassis Alvan Dame, M.D.    06/20/2018 8:55 AM  NAME:  Erica Calhoun RECORD NO.:  [id number]                             FACILITY:  [facility]      PHYSICIAN:  Pietro Cassis. Alvan Dame, M.D.  DATE OF BIRTH:  24-Mar-1951      DATE OF PROCEDURE:  06/20/2018                                     OPERATIVE REPORT         PREOPERATIVE DIAGNOSIS:  Bilateral knee osteoarthritis.      POSTOPERATIVE DIAGNOSIS:  Bilateral knee osteoarthritis.      FINDINGS:  The patient was noted to have complete loss of cartilage and   bone-on-bone arthritis with associated osteophytes in the medial and patellofemoral compartments of   the knee with a significant synovitis and associated effusion.  The patient had failed months of conservative treatment including medications, injection therapy, activity modification.     PROCEDURE:  Right total knee replacement.      COMPONENTS USED:  DePuy Attune rotating platform posterior stabilized knee   system, a size 5N femur, 5 tibia, size 6 mm PS AOX insert, and 35 anatomic patellar   button.      SURGEON:   Pietro Cassis. Alvan Dame, M.D.      ASSISTANT:  Danae Orleans, PA-C.      ANESTHESIA:  General and Regional.      SPECIMENS:  None.      COMPLICATION:  None.      DRAINS:  None.  EBL: <150cc      TOURNIQUET TIME:  38 min at 250 mmHg   The patient was stable to the recovery room.      INDICATION FOR PROCEDURE:  Erica Calhoun is a 67 y.o. female patient of   mine.  The patient had been seen, evaluated, and treated for months conservatively in the   office with medication, activity modification, and injections.  The patient had   radiographic changes of bone-on-bone arthritis with endplate sclerosis and osteophytes noted.  Based on the radiographic changes and failed conservative measures, the patient   decided to proceed with definitive treatment, total knee replacement.  Risks  of infection, DVT, component failure, need for revision surgery, neurovascular injury were reviewed in the office setting.  The postop course was reviewed stressing the efforts to maximize post-operative satisfaction and function.  Consent was obtained for benefit of pain   relief.      PROCEDURE IN DETAIL:  The patient was brought to the operative theater.   Once adequate anesthesia, preoperative antibiotics, 1 gm of Vancomycin, Gentamycin,1 gm of Tranexamic Acid, and 10 mg of Decadron administered, the patient was positioned supine with a bilaterall thigh tourniquets placed.  Both lower extremities wer prepped and draped in sterile fashion.  A time-   out was performed identifying the patient, planned procedure, and the appropriate extremity.      Both lower extremies had been placed in the [facility] leg holders.  The right leg was   exsanguinated, tourniquet elevated to 250 mmHg.  A midline incision was   made followed by median parapatellar arthrotomy.  Following initial   exposure, attention was first directed to the patella.  Precut   measurement was noted to be 21 mm.  I resected down to 13 mm and used a   35  anatomic patellar button to restore patellar height as well as cover the cut surface.      The lug holes were drilled and a metal shim was placed to protect the   patella from retractors and saw blade during the procedure.      At this point, attention was now directed to the femur.  The femoral   canal was opened with a drill, irrigated to try to prevent fat emboli.  An   intramedullary rod was passed at 3 degrees valgus, 9 mm of bone was   resected off the distal femur.  Following this resection, the tibia was   subluxated anteriorly.  Using the extramedullary guide, 2 mm of bone was resected off   the proximal medial tibia.  We confirmed the gap would be   stable medially and laterally with a size 5 spacer block as well as confirmed that the tibial cut was perpendicular in the coronal plane, checking with an alignment rod.      Once this was done, I sized the femur to be a size 5 in the anterior-   posterior dimension, chose a narrow component based on medial and   lateral dimension.  The size 5 rotation block was then pinned in   position anterior referenced using the C-clamp to set rotation.  The   anterior, posterior, and  chamfer cuts were made without difficulty nor   notching making certain that I was along the anterior cortex to help   with flexion gap stability.      The final box cut was made off the lateral aspect of distal femur.      At this point, the tibia was sized to be a size 5.  The size 5 tray was   then pinned in position through the medial third of the tubercle,   drilled, and keel punched.  Trial reduction was now carried with a 5 femur,  5 tibia, a 5 then 6 mm PS insert, and the 35 anatomic patella botton.  The knee was brought to full extension with good flexion stability with the patella   tracking through the trochlea without application of pressure.  Given   all these findings the trial components removed.  Final components were   opened and cement was  mixed.  The knee was  irrigated with normal saline solution and pulse lavage.  The synovial lining was   then injected with the other half of (30 cc of 0.25% Marcaine with epinephrine, 1 cc of Toradol and 30 cc of NS for a total of 61 cc).     Final implants were then cemented onto cleaned and dried cut surfaces of bone with the knee brought to extension with a size 6 mm PS trial insert.      Once the cement had fully cured, excess cement was removed   throughout the knee.  I confirmed that I was satisfied with the range of   motion and stability, and the final size 6 mm PS AOX insert was chosen.  It was   placed into the knee.      The tourniquet had been let down at 38 minutes.  No significant   hemostasis was required.  The extensor mechanism was then reapproximated using #1 Vicryl and #1 Stratafix sutures with the knee   in flexion.  The   remaining wound was closed with 2-0 Vicryl and running 4-0 Monocryl.   The knee was cleaned, dried, dressed sterilely using Dermabond and   Aquacel dressing.  The patient was then   brought to recovery room in stable condition, tolerating the procedure   well.   Please note that Physician Assistant, Danae Orleans, PA-C was present for the entirety of the case, and was utilized for pre-operative positioning, peri-operative retractor management, general facilitation of the procedure and for primary wound closure at the end of the case.              Pietro Cassis Alvan Dame, M.D.    06/20/2018 8:55 AM

## 2018-06-20 NOTE — Discharge Instructions (Addendum)

## 2018-06-20 NOTE — Progress Notes (Signed)
AssistedDr. Smith Robert with right, left, ultrasound guided, adductor canal block. Side rails up, monitors on throughout procedure. See vital signs in flow sheet. Tolerated Procedure well.

## 2018-06-21 ENCOUNTER — Encounter (HOSPITAL_COMMUNITY): Payer: Self-pay | Admitting: Orthopedic Surgery

## 2018-06-21 DIAGNOSIS — E669 Obesity, unspecified: Secondary | ICD-10-CM | POA: Diagnosis present

## 2018-06-21 LAB — BASIC METABOLIC PANEL
Anion gap: 9 (ref 5–15)
BUN: 8 mg/dL (ref 8–23)
CHLORIDE: 104 mmol/L (ref 98–111)
CO2: 24 mmol/L (ref 22–32)
Calcium: 8.3 mg/dL — ABNORMAL LOW (ref 8.9–10.3)
Creatinine, Ser: 0.44 mg/dL (ref 0.44–1.00)
GFR calc non Af Amer: >60 mL/min (ref >60)
Glucose, Bld: 164 mg/dL — ABNORMAL HIGH (ref 70–99)
POTASSIUM: 4 mmol/L (ref 3.5–5.1)
SODIUM: 137 mmol/L (ref 135–145)

## 2018-06-21 LAB — CBC
HCT: 28.1 % — ABNORMAL LOW (ref 36.0–46.0)
HEMOGLOBIN: 9.4 g/dL — AB (ref 12.0–15.0)
MCH: 29.4 pg (ref 26.0–34.0)
MCHC: 33.5 g/dL (ref 30.0–36.0)
MCV: 87.8 fL (ref 80.0–100.0)
NRBC: 0 % (ref 0.0–0.2)
Platelets: 142 10*3/uL — ABNORMAL LOW (ref 150–400)
RBC: 3.2 MIL/uL — AB (ref 3.87–5.11)
RDW: 12.3 % (ref 11.5–15.5)
WBC: 12.4 10*3/uL — ABNORMAL HIGH (ref 4.0–10.5)

## 2018-06-21 NOTE — Progress Notes (Signed)
     Subjective: 1 Day Post-Op Procedure(s) (LRB): TOTAL KNEE BILATERAL (Bilateral)   Patient reports pain as mild, pain controlled.  No events throughout the night. Already been working PT exercises in the bed.  Looking forward to progressing and getting well. Discussed plan on d/c home either Sat or Sun depending on progress and the need for PT to be discharged home safetly.  Anticipated LOS equal to or greater than 2 midnights due to - Age 66 and older with one or more of the following:  - Obesity  - Expected need for hospital services (PT, OT, Nursing) required for safe discharge  - Active co-morbidities: bilateral TKAs   Objective:   VITALS:   Vitals:   06/21/18 0131 06/21/18 0542  BP: 132/67 114/69  Pulse: (!) 52 (!) 55  Resp: 16 18  Temp: 97.7 F (36.5 C) 97.8 F (36.6 C)  SpO2: 100% 100%    Dorsiflexion/Plantar flexion intact Incision: dressing C/D/I No cellulitis present Compartment soft  LABS Recent Labs    06/21/18 0437  HGB 9.4*  HCT 28.1*  WBC 12.4*  PLT 142*    Recent Labs    06/21/18 0437  NA 137  K 4.0  BUN 8  CREATININE 0.44  GLUCOSE 164*     Assessment/Plan: 1 Day Post-Op Procedure(s) (LRB): TOTAL KNEE BILATERAL (Bilateral) Foley cath d/c'ed Advance diet Up with therapy D/C IV fluids Discharge home either Sat or Sun depending on progress  Obese (BMI 30-39.9) Estimated body mass index is 34.9 kg/m as calculated from the following:   Height as of this encounter: 5\' 3"  (1.6 m).   Weight as of this encounter: 89.4 kg. Patient also counseled that weight may inhibit the healing process Patient counseled that losing weight will help with future health issues        Erica Calhoun. Erica Calhoun   PAC  06/21/2018, 8:03 AM

## 2018-06-21 NOTE — Progress Notes (Signed)
Physical Therapy Treatment Patient Details Name: Erica Calhoun MRN: [id number] DOB: December 25, 1950 Today's Date: 06/21/2018    History of Present Illness 67 YO female s/p bilateal TKR on 06/20/18. PMH includes arthritis, HTN, heart murmurs, aortic stenosis, cervical fusion 2018, lumbar fusion 2019, fibromyalgia, PNA.     PT Comments    Pt extremely motivated and progressing well with mobility.  Therex program initiated and pt cautioned about over-doing immediately post op.   Follow Up Recommendations  Follow surgeon's recommendation for DC plan and follow-up therapies;Supervision for mobility/OOB     Equipment Recommendations  None recommended by PT    Recommendations for Other Services       Precautions / Restrictions Precautions Precautions: Fall Restrictions Weight Bearing Restrictions: No Other Position/Activity Restrictions: WBAT     Mobility  Bed Mobility Overal bed mobility: Needs Assistance Bed Mobility: Supine to Sit     Supine to sit: Min guard;HOB elevated        Transfers Overall transfer level: Needs assistance Equipment used: Rolling walker (2 wheeled) Transfers: Sit to/from Stand Sit to Stand: Min assist;Min guard;From elevated surface         General transfer comment: steady assist only  Ambulation/Gait Ambulation/Gait assistance: Min guard Gait Distance (Feet): 88 Feet Assistive device: Rolling walker (2 wheeled) Gait Pattern/deviations: Step-to pattern;Decreased stride length;Antalgic;Shuffle;Trunk flexed Gait velocity: decr    General Gait Details: min cues for posture and position from [employer] Mobility    Modified Rankin (Stroke Patients Only)       Balance Overall balance assessment: Mild deficits observed, not formally tested                                          Cognition Arousal/Alertness: Awake/alert Behavior During Therapy: WFL for tasks  assessed/performed Overall Cognitive Status: Within Functional Limits for tasks assessed                                        Exercises Total Joint Exercises Ankle Circles/Pumps: AROM;Both;Seated;15 reps Quad Sets: AROM;Both;10 reps;Supine Heel Slides: AAROM;Both;15 reps;Supine Straight Leg Raises: AROM;Both;10 reps;Supine Goniometric ROM: AAROM R knee -10- 105; L knee -10 - 100    General Comments        Pertinent Vitals/Pain Pain Assessment: 0-10 Pain Score: 6  Pain Location: bilateral knees  Pain Descriptors / Indicators: Aching;Sore Pain Intervention(s): Limited activity within patient's tolerance;Monitored during session;Premedicated before session;Ice applied    Home Living                      Prior Function            PT Goals (current goals can now be found in the care plan section) Acute Rehab PT Goals PT Goal Formulation: With patient Time For Goal Achievement: 07/04/18 Potential to Achieve Goals: Good Progress towards PT goals: Progressing toward goals    Frequency    7X/week      PT Plan Current plan remains appropriate    Co-evaluation              AM-PAC PT "6 Clicks" Daily Activity  Outcome Measure  Difficulty turning over in bed (including adjusting bedclothes, sheets and blankets)?: A Lot  Difficulty moving from lying on back to sitting on the side of the bed? : A Lot Difficulty sitting down on and standing up from a chair with arms (e.g., wheelchair, bedside commode, etc,.)?: A Lot Help needed moving to and from a bed to chair (including a wheelchair)?: A Little Help needed walking in hospital room?: A Little Help needed climbing 3-5 steps with a railing? : A Little 6 Click Score: 15    End of Session Equipment Utilized During Treatment: Gait belt Activity Tolerance: Patient tolerated treatment well Patient left: in chair;with call bell/phone within reach;with chair alarm set Nurse Communication:  Mobility status PT Visit Diagnosis: Difficulty in walking, not elsewhere classified (R26.2)     Time: 4695-0722 PT Time Calculation (min) (ACUTE ONLY): 38 min  Charges:  $Gait Training: 8-22 mins $Therapeutic Exercise: 23-37 mins                     [place] Pager [phone] Office [phone]    Loula Marcella 06/21/2018, 8:52 AM

## 2018-06-21 NOTE — Evaluation (Signed)
Occupational Therapy Evaluation Patient Details Name: Erica Calhoun MRN: [id number] DOB: 1951-03-10 Today's Date: 06/21/2018    History of Present Illness 67 YO female s/p bilateal TKR on 06/20/18. PMH includes arthritis, HTN, heart murmurs, aortic stenosis, cervical fusion 2018, lumbar fusion 2019, fibromyalgia, PNA.    Clinical Impression   Pt admitted with the above diagnoses and presents with below problem list. PTA pt was independent with ADLs. Pt is currently supervision to min guard with LB ADLs, toilet transfers, and functional mobility. All education completed with pt regarding strategies for ADLs. No further OT needs indicated. OT signing off.     Follow Up Recommendations  No OT follow up;Supervision - Intermittent    Equipment Recommendations  None recommended by OT    Recommendations for Other Services       Precautions / Restrictions Precautions Precautions: Fall Restrictions Weight Bearing Restrictions: No Other Position/Activity Restrictions: WBAT       Mobility Bed Mobility Overal bed mobility: Needs Assistance Bed Mobility: Supine to Sit     Supine to sit: Min guard;HOB elevated     General bed mobility comments: OOB/up in chair  Transfers Overall transfer level: Needs assistance Equipment used: Rolling walker (2 wheeled) Transfers: Sit to/from Stand Sit to Stand: Min guard;From elevated surface         General transfer comment: steady assist only    Balance Overall balance assessment: Mild deficits observed, not formally tested                                         ADL either performed or assessed with clinical judgement   ADL Overall ADL's : Needs assistance/impaired Eating/Feeding: Set up;Sitting   Grooming: Min guard;Set up;Standing;Sitting   Upper Body Bathing: Set up;Sitting   Lower Body Bathing: Supervison/ safety;Sit to/from stand;Min guard   Upper Body Dressing : Set up;Sitting   Lower Body  Dressing: Supervision/safety;Min guard;Sit to/from stand   Toilet Transfer: Supervision/safety;Ambulation;RW   Toileting- Clothing Manipulation and Hygiene: Supervision/safety;Min guard;Sit to/from stand   Tub/ Shower Transfer: Walk-in shower;Min guard;Ambulation;Shower seat;Rolling walker   Functional mobility during ADLs: Supervision/safety;Min guard;Rolling walker General ADL Comments: Pt received walking out of bathroom with NT. Pt completed in-room functinoal mobility and transferred to recliner. Discussed LB dressing.     Vision         Perception     Praxis      Pertinent Vitals/Pain Pain Assessment: Faces Pain Score: 6  Faces Pain Scale: Hurts little more Pain Location: bilateral knees  Pain Descriptors / Indicators: Aching;Sore Pain Intervention(s): Limited activity within patient's tolerance;Monitored during session;Repositioned     Hand Dominance Right   Extremity/Trunk Assessment Upper Extremity Assessment Upper Extremity Assessment: Overall WFL for tasks assessed   Lower Extremity Assessment Lower Extremity Assessment: Defer to PT evaluation   Cervical / Trunk Assessment Cervical / Trunk Assessment: Normal   Communication Communication Communication: No difficulties   Cognition Arousal/Alertness: Awake/alert Behavior During Therapy: WFL for tasks assessed/performed Overall Cognitive Status: Within Functional Limits for tasks assessed                                     General Comments          Shoulder Instructions      Home Living Family/patient expects to be discharged to:: Private residence  Living Arrangements: Spouse/significant other Available Help at Discharge: Family;Available PRN/intermittently Type of Home: Mobile home Home Access: Stairs to enter Entrance Stairs-Number of Steps: 4 Entrance Stairs-Rails: Left Home Layout: One level     Bathroom Shower/Tub: Occupational psychologist: Standard Bathroom  Accessibility: Yes   Home Equipment: Environmental consultant - 2 wheels;Shower seat          Prior Functioning/Environment Level of Independence: Independent                 OT Problem List: Impaired balance (sitting and/or standing);Decreased knowledge of use of DME or AE;Decreased knowledge of precautions;Pain      OT Treatment/Interventions:      OT Goals(Current goals can be found in the care plan section) Acute Rehab OT Goals Patient Stated Goal: recover and then travel  OT Frequency:     Barriers to D/C:            Co-evaluation              AM-PAC PT "6 Clicks" Daily Activity     Outcome Measure Help from another person eating meals?: None Help from another person taking care of personal grooming?: None Help from another person toileting, which includes using toliet, bedpan, or urinal?: None Help from another person bathing (including washing, rinsing, drying)?: A Little Help from another person to put on and taking off regular upper body clothing?: None Help from another person to put on and taking off regular lower body clothing?: None 6 Click Score: 23   End of Session Equipment Utilized During Treatment: Rolling walker  Activity Tolerance: Patient tolerated treatment well Patient left: in chair;with call bell/phone within reach  OT Visit Diagnosis: Unsteadiness on feet (R26.81);Pain                Time: 9758-8325 OT Time Calculation (min): 8 min Charges:  OT General Charges $OT Visit: 1 Visit OT Evaluation $OT Eval Low Complexity: [place], OT Acute Rehabilitation Services Pager: [phone] Office: [phone]   Hortencia Pilar 06/21/2018, 11:24 AM

## 2018-06-21 NOTE — Progress Notes (Signed)
Physical Therapy Treatment Patient Details Name: Erica Calhoun MRN: [id number] DOB: 1950/12/17 Today's Date: 06/21/2018    History of Present Illness 67 YO female s/p bilateal TKR on 06/20/18. PMH includes arthritis, HTN, heart murmurs, aortic stenosis, cervical fusion 2018, lumbar fusion 2019, fibromyalgia, PNA.     PT Comments    Pt progressing well with mobility and negotiated stairs this pm.  Pt cautioned to not over-do POD 1 - pt reports multiple sets of QS and SLR since last session.   Follow Up Recommendations  Follow surgeon's recommendation for DC plan and follow-up therapies;Supervision for mobility/OOB     Equipment Recommendations  None recommended by PT    Recommendations for Other Services       Precautions / Restrictions Precautions Precautions: Fall Restrictions Weight Bearing Restrictions: No Other Position/Activity Restrictions: WBAT     Mobility  Bed Mobility               General bed mobility comments: Pt up in chair and requests back to same  Transfers Overall transfer level: Needs assistance Equipment used: Rolling walker (2 wheeled) Transfers: Sit to/from Stand Sit to Stand: Min guard         General transfer comment: steady assist only  Ambulation/Gait Ambulation/Gait assistance: Min guard Gait Distance (Feet): 165 Feet Assistive device: Rolling walker (2 wheeled) Gait Pattern/deviations: Step-to pattern;Decreased stride length;Antalgic;Shuffle;Trunk flexed Gait velocity: decr    General Gait Details: min cues for posture and position from RW   Stairs Stairs: Yes Stairs assistance: Min assist Stair Management: Two rails;Step to pattern;Forwards Number of Stairs: 5 General stair comments: cues for sequence and foot placement   Wheelchair Mobility    Modified Rankin (Stroke Patients Only)       Balance Overall balance assessment: Mild deficits observed, not formally tested                                           Cognition Arousal/Alertness: Awake/alert Behavior During Therapy: WFL for tasks assessed/performed Overall Cognitive Status: Within Functional Limits for tasks assessed                                        Exercises Total Joint Exercises Ankle Circles/Pumps: AROM;Both;Seated;15 reps Heel Slides: AAROM;Both;15 reps;Supine Goniometric ROM: bil knees -5 - 110 AAROM    General Comments        Pertinent Vitals/Pain Pain Assessment: 0-10 Pain Score: 5  Pain Location: bilateral knees  Pain Descriptors / Indicators: Aching;Sore Pain Intervention(s): Limited activity within patient's tolerance;Monitored during session;Premedicated before session;Ice applied    Home Living                      Prior Function            PT Goals (current goals can now be found in the care plan section) Acute Rehab PT Goals Patient Stated Goal: recover and then travel PT Goal Formulation: With patient Time For Goal Achievement: 07/04/18 Potential to Achieve Goals: Good Progress towards PT goals: Progressing toward goals    Frequency    7X/week      PT Plan Current plan remains appropriate    Co-evaluation              AM-PAC PT "6 Clicks" Daily Activity  Outcome Measure  Difficulty turning over in bed (including adjusting bedclothes, sheets and blankets)?: A Lot Difficulty moving from lying on back to sitting on the side of the bed? : A Lot Difficulty sitting down on and standing up from a chair with arms (e.g., wheelchair, bedside commode, etc,.)?: A Lot Help needed moving to and from a bed to chair (including a wheelchair)?: A Little Help needed walking in hospital room?: A Little Help needed climbing 3-5 steps with a railing? : A Little 6 Click Score: 15    End of Session Equipment Utilized During Treatment: Gait belt Activity Tolerance: Patient tolerated treatment well Patient left: in chair;with call bell/phone within  reach;with chair alarm set Nurse Communication: Mobility status PT Visit Diagnosis: Difficulty in walking, not elsewhere classified (R26.2)     Time: 4360-6770 PT Time Calculation (min) (ACUTE ONLY): 36 min  Charges:  $Gait Training: 8-22 mins $Therapeutic Activity: 8-22 mins                     [place] Pager [phone] Office [phone]    Esthela Brandner 06/21/2018, 3:46 PM

## 2018-06-22 LAB — BASIC METABOLIC PANEL
Anion gap: 7 (ref 5–15)
BUN: 12 mg/dL (ref 8–23)
CALCIUM: 8.5 mg/dL — AB (ref 8.9–10.3)
CO2: 28 mmol/L (ref 22–32)
CREATININE: 0.48 mg/dL (ref 0.44–1.00)
Chloride: 104 mmol/L (ref 98–111)
GFR calc Af Amer: >60 mL/min (ref >60)
Glucose, Bld: 145 mg/dL — ABNORMAL HIGH (ref 70–99)
POTASSIUM: 3.7 mmol/L (ref 3.5–5.1)
SODIUM: 139 mmol/L (ref 135–145)

## 2018-06-22 LAB — CBC
HCT: 24.9 % — ABNORMAL LOW (ref 36.0–46.0)
Hemoglobin: 8.5 g/dL — ABNORMAL LOW (ref 12.0–15.0)
MCH: 29.6 pg (ref 26.0–34.0)
MCHC: 34.1 g/dL (ref 30.0–36.0)
MCV: 86.8 fL (ref 80.0–100.0)
PLATELETS: 158 10*3/uL (ref 150–400)
RBC: 2.87 MIL/uL — AB (ref 3.87–5.11)
RDW: 12.4 % (ref 11.5–15.5)
WBC: 10.2 10*3/uL (ref 4.0–10.5)
nRBC: 0 % (ref 0.0–0.2)

## 2018-06-22 NOTE — Progress Notes (Signed)
Discharge paperwork discussed with pt and husband at the bedside.  They demonstrated understanding, and pt was escorted by wheelchair in stable condition to main lobby.

## 2018-06-22 NOTE — Progress Notes (Signed)
Subjective: 2 Days Post-Op Procedure(s) (LRB): TOTAL KNEE BILATERAL (Bilateral) Patient reports pain as 3 on 0-10 scale.   No SOB Objective: Vital signs in last 24 hours: Temp:  [98 F (36.7 C)-98.5 F (36.9 C)] 98 F (36.7 C) (10/19 0516) Pulse Rate:  [60-74] 69 (10/19 0516) Resp:  [16-17] 17 (10/18 2115) BP: (118-143)/(55-68) 143/65 (10/19 0516) SpO2:  [99 %-100 %] 100 % (10/19 0516)  Intake/Output from previous day: 10/18 0701 - 10/19 0700 In: 1742.8 [P.O.:960; I.V.:782.8] Out: 1700 [Urine:1700] Intake/Output this shift: No intake/output data recorded.  Recent Labs    06/21/18 0437 06/22/18 0505  HGB 9.4* 8.5*   Recent Labs    06/21/18 0437 06/22/18 0505  WBC 12.4* 10.2  RBC 3.20* 2.87*  HCT 28.1* 24.9*  PLT 142* 158   Recent Labs    06/21/18 0437 06/22/18 0505  NA 137 139  K 4.0 3.7  CL 104 104  CO2 24 28  BUN 8 12  CREATININE 0.44 0.48  GLUCOSE 164* 145*  CALCIUM 8.3* 8.5*   No results for input(s): LABPT, INR in the last 72 hours.  Neurologically intact Neurovascular intact Sensation intact distally Intact pulses distally Dorsiflexion/Plantar flexion intact Incision: dressing C/D/I Compartment soft no dvt   Patient's anticipated LOS is less than 2 midnights, meeting these requirements: - Younger than 66 - Lives within 1 hour of care - Has a competent adult at home to recover with post-op recover - NO history of  - Chronic pain requiring opiods  - Diabetes  - Coronary Artery Disease  - Heart failure  - Heart attack  - Stroke  - DVT/VTE  - Cardiac arrhythmia  - Respiratory Failure/COPD  - Renal failure  - Anemia  - Advanced Liver disease      Assessment/Plan: 2 Days Post-Op Procedure(s) (LRB): TOTAL KNEE BILATERAL (Bilateral) D/C IV fluids Discharge home with home health  Asymptomatic anemia. Discussed with patient MVI with iron And F/U with medical MD for repeat H/H next week or with Sympt No active  bleeding    Okema Rollinson C 06/22/2018, 7:31 AM

## 2018-06-22 NOTE — Plan of Care (Signed)
Pt alert and oriented, doing well this am.  Plan to d/c pt per MD order.

## 2018-06-22 NOTE — Progress Notes (Signed)
Physical Therapy Treatment Patient Details Name: Erica Calhoun MRN: [id number] DOB: Jun 04, 1951 Today's Date: 06/22/2018    History of Present Illness 67 YO female s/p bilateal TKR on 06/20/18. PMH includes arthritis, HTN, heart murmurs, aortic stenosis, cervical fusion 2018, lumbar fusion 2019, fibromyalgia, PNA.     PT Comments    Pt continues motivated but with increased pain this am.  Pt reviewed stairs and home program this am with written instruction provided.     Follow Up Recommendations  Follow surgeon's recommendation for DC plan and follow-up therapies;Supervision for mobility/OOB     Equipment Recommendations  None recommended by PT    Recommendations for Other Services       Precautions / Restrictions Precautions Precautions: Fall Restrictions Weight Bearing Restrictions: No Other Position/Activity Restrictions: WBAT     Mobility  Bed Mobility               General bed mobility comments: Pt up in chair and requests back to same  Transfers Overall transfer level: Needs assistance Equipment used: Rolling walker (2 wheeled) Transfers: Sit to/from Stand Sit to Stand: Supervision         General transfer comment: pt self cues  Ambulation/Gait Ambulation/Gait assistance: Min guard;Supervision Gait Distance (Feet): 140 Feet Assistive device: Rolling walker (2 wheeled) Gait Pattern/deviations: Decreased stride length;Antalgic;Shuffle;Trunk flexed;Step-to pattern Gait velocity: decr    General Gait Details: min cues for posture and position from RW   Stairs Stairs: Yes Stairs assistance: Min assist Stair Management: One rail Left;Step to pattern;Forwards;With cane Number of Stairs: 2 General stair comments: cues for sequence and foot/cane placement   Wheelchair Mobility    Modified Rankin (Stroke Patients Only)       Balance Overall balance assessment: Mild deficits observed, not formally tested                                           Cognition Arousal/Alertness: Awake/alert Behavior During Therapy: WFL for tasks assessed/performed Overall Cognitive Status: Within Functional Limits for tasks assessed                                        Exercises Total Joint Exercises Ankle Circles/Pumps: AROM;Both;Seated;15 reps Quad Sets: AROM;Both;10 reps;Supine Heel Slides: AAROM;Both;15 reps;Supine Straight Leg Raises: AROM;Both;10 reps;Supine Goniometric ROM: AAROM R knee -8 - 95; L -8 - 100    General Comments        Pertinent Vitals/Pain Pain Assessment: 0-10 Pain Score: 6  Pain Location: bilateral knees  Pain Descriptors / Indicators: Aching;Sore Pain Intervention(s): Limited activity within patient's tolerance;Monitored during session;Premedicated before session;Ice applied    Home Living                      Prior Function            PT Goals (current goals can now be found in the care plan section) Acute Rehab PT Goals Patient Stated Goal: recover and then travel PT Goal Formulation: With patient Time For Goal Achievement: 07/04/18 Potential to Achieve Goals: Good Progress towards PT goals: Progressing toward goals    Frequency    7X/week      PT Plan Current plan remains appropriate    Co-evaluation  AM-PAC PT "6 Clicks" Daily Activity  Outcome Measure  Difficulty turning over in bed (including adjusting bedclothes, sheets and blankets)?: A Lot Difficulty moving from lying on back to sitting on the side of the bed? : A Lot Difficulty sitting down on and standing up from a chair with arms (e.g., wheelchair, bedside commode, etc,.)?: A Lot Help needed moving to and from a bed to chair (including a wheelchair)?: A Little Help needed walking in hospital room?: A Little Help needed climbing 3-5 steps with a railing? : A Little 6 Click Score: 15    End of Session Equipment Utilized During Treatment: Gait belt Activity  Tolerance: Patient tolerated treatment well Patient left: in chair;with call bell/phone within reach;with chair alarm set Nurse Communication: Mobility status PT Visit Diagnosis: Difficulty in walking, not elsewhere classified (R26.2)     Time: 1937-9024 PT Time Calculation (min) (ACUTE ONLY): 35 min  Charges:  $Gait Training: 8-22 mins $Therapeutic Exercise: 8-22 mins                     [place] Pager [phone] Office [phone]    Geriann Lafont 06/22/2018, 9:50 AM

## 2018-06-24 DIAGNOSIS — Z96652 Presence of left artificial knee joint: Secondary | ICD-10-CM | POA: Diagnosis not present

## 2018-06-24 DIAGNOSIS — M25561 Pain in right knee: Secondary | ICD-10-CM | POA: Diagnosis not present

## 2018-06-24 DIAGNOSIS — Z96651 Presence of right artificial knee joint: Secondary | ICD-10-CM | POA: Diagnosis not present

## 2018-06-24 DIAGNOSIS — M25562 Pain in left knee: Secondary | ICD-10-CM | POA: Diagnosis not present

## 2018-06-26 DIAGNOSIS — M25561 Pain in right knee: Secondary | ICD-10-CM | POA: Diagnosis not present

## 2018-06-26 DIAGNOSIS — Z96652 Presence of left artificial knee joint: Secondary | ICD-10-CM | POA: Diagnosis not present

## 2018-06-26 DIAGNOSIS — M25562 Pain in left knee: Secondary | ICD-10-CM | POA: Diagnosis not present

## 2018-06-26 DIAGNOSIS — D500 Iron deficiency anemia secondary to blood loss (chronic): Secondary | ICD-10-CM | POA: Diagnosis not present

## 2018-06-26 DIAGNOSIS — Z96653 Presence of artificial knee joint, bilateral: Secondary | ICD-10-CM | POA: Diagnosis not present

## 2018-06-26 DIAGNOSIS — Z96651 Presence of right artificial knee joint: Secondary | ICD-10-CM | POA: Diagnosis not present

## 2018-06-26 DIAGNOSIS — Z6834 Body mass index (BMI) 34.0-34.9, adult: Secondary | ICD-10-CM | POA: Diagnosis not present

## 2018-06-26 DIAGNOSIS — I10 Essential (primary) hypertension: Secondary | ICD-10-CM | POA: Diagnosis not present

## 2018-06-28 DIAGNOSIS — Z96652 Presence of left artificial knee joint: Secondary | ICD-10-CM | POA: Diagnosis not present

## 2018-06-28 DIAGNOSIS — M25562 Pain in left knee: Secondary | ICD-10-CM | POA: Diagnosis not present

## 2018-06-28 DIAGNOSIS — Z96651 Presence of right artificial knee joint: Secondary | ICD-10-CM | POA: Diagnosis not present

## 2018-06-28 DIAGNOSIS — M25561 Pain in right knee: Secondary | ICD-10-CM | POA: Diagnosis not present

## 2018-07-01 DIAGNOSIS — Z96651 Presence of right artificial knee joint: Secondary | ICD-10-CM | POA: Diagnosis not present

## 2018-07-01 DIAGNOSIS — M25561 Pain in right knee: Secondary | ICD-10-CM | POA: Diagnosis not present

## 2018-07-01 DIAGNOSIS — M25562 Pain in left knee: Secondary | ICD-10-CM | POA: Diagnosis not present

## 2018-07-01 DIAGNOSIS — Z96652 Presence of left artificial knee joint: Secondary | ICD-10-CM | POA: Diagnosis not present

## 2018-07-03 DIAGNOSIS — Z96651 Presence of right artificial knee joint: Secondary | ICD-10-CM | POA: Diagnosis not present

## 2018-07-03 DIAGNOSIS — M25561 Pain in right knee: Secondary | ICD-10-CM | POA: Diagnosis not present

## 2018-07-03 DIAGNOSIS — Z96652 Presence of left artificial knee joint: Secondary | ICD-10-CM | POA: Diagnosis not present

## 2018-07-03 DIAGNOSIS — M25562 Pain in left knee: Secondary | ICD-10-CM | POA: Diagnosis not present

## 2018-07-04 DIAGNOSIS — Z96651 Presence of right artificial knee joint: Secondary | ICD-10-CM | POA: Diagnosis not present

## 2018-07-04 DIAGNOSIS — M25561 Pain in right knee: Secondary | ICD-10-CM | POA: Diagnosis not present

## 2018-07-04 DIAGNOSIS — M25562 Pain in left knee: Secondary | ICD-10-CM | POA: Diagnosis not present

## 2018-07-04 DIAGNOSIS — Z96652 Presence of left artificial knee joint: Secondary | ICD-10-CM | POA: Diagnosis not present

## 2018-07-08 DIAGNOSIS — Z96651 Presence of right artificial knee joint: Secondary | ICD-10-CM | POA: Diagnosis not present

## 2018-07-08 DIAGNOSIS — Z96652 Presence of left artificial knee joint: Secondary | ICD-10-CM | POA: Diagnosis not present

## 2018-07-08 DIAGNOSIS — M25562 Pain in left knee: Secondary | ICD-10-CM | POA: Diagnosis not present

## 2018-07-08 DIAGNOSIS — M25561 Pain in right knee: Secondary | ICD-10-CM | POA: Diagnosis not present

## 2018-07-10 DIAGNOSIS — Z96652 Presence of left artificial knee joint: Secondary | ICD-10-CM | POA: Diagnosis not present

## 2018-07-10 DIAGNOSIS — Z96651 Presence of right artificial knee joint: Secondary | ICD-10-CM | POA: Diagnosis not present

## 2018-07-10 DIAGNOSIS — M25561 Pain in right knee: Secondary | ICD-10-CM | POA: Diagnosis not present

## 2018-07-10 DIAGNOSIS — M25562 Pain in left knee: Secondary | ICD-10-CM | POA: Diagnosis not present

## 2018-07-12 DIAGNOSIS — M25562 Pain in left knee: Secondary | ICD-10-CM | POA: Diagnosis not present

## 2018-07-12 DIAGNOSIS — M25561 Pain in right knee: Secondary | ICD-10-CM | POA: Diagnosis not present

## 2018-07-12 DIAGNOSIS — Z96652 Presence of left artificial knee joint: Secondary | ICD-10-CM | POA: Diagnosis not present

## 2018-07-12 DIAGNOSIS — Z96651 Presence of right artificial knee joint: Secondary | ICD-10-CM | POA: Diagnosis not present

## 2018-07-15 DIAGNOSIS — M25562 Pain in left knee: Secondary | ICD-10-CM | POA: Diagnosis not present

## 2018-07-15 DIAGNOSIS — M25561 Pain in right knee: Secondary | ICD-10-CM | POA: Diagnosis not present

## 2018-07-15 DIAGNOSIS — Z96652 Presence of left artificial knee joint: Secondary | ICD-10-CM | POA: Diagnosis not present

## 2018-07-15 DIAGNOSIS — Z96651 Presence of right artificial knee joint: Secondary | ICD-10-CM | POA: Diagnosis not present

## 2018-07-16 NOTE — Discharge Summary (Signed)
Physician Discharge Summary  Patient ID: LORETTO BELINSKY MRN: [id number] DOB/AGE: [birth year] 67 y.o.  Admit date: 06/20/2018 Discharge date: 06/22/2018   Procedures:  Procedure(s) (LRB): TOTAL KNEE BILATERAL (Bilateral)  Attending Physician:  Dr. Paralee Cancel   Admission Diagnoses:   Bilateral knee OA / pain  Discharge Diagnoses:  Principal Problem:   S/P bilateral TKAs Active Problems:   Obese  Past Medical History:  Diagnosis Date  . Aortic stenosis   . Arthritis    cervical and lower back   . Chest pain    s/p LHC 11/28/16: 10% mLAd, dLAD, mCx, EF 60%, very mild AS (peak grad 16) - cardiologist Dr. Wynonia Lawman  . Dyslipidemia   . Fibromyalgia   . Heart murmur    mild AS 11/2016 cath  . History of recurrent UTIs   . Hypertension   . Hypothyroidism   . Leaky heart valve    recent visit with cardiologist, okay'd for surgery  . Pneumonia    hx/1/17  . 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 female, has a history of pain and functional disability in the bilateral knee due to arthritis and has failed non-surgical conservative treatments for greater than 12 weeks to include NSAID's and/or analgesics and activity modification.  Onset of symptoms was gradual, starting 2+ years ago with gradually worsening course since that time. The patient noted no past surgery on the bilaterall knee(s).  Patient currently rates pain in the bilateral knee(s) at 7 out of 10 with activity. Patient has night pain, worsening of pain with activity and weight bearing, pain that interferes with activities of daily living, pain with passive range of motion, crepitus and joint swelling.  Patient has evidence of periarticular osteophytes and joint space narrowing by imaging studies.  There is no active infection.  Risks, benefits and expectations were discussed with the patient.  Risks including but not limited to the risk of anesthesia,  blood clots, nerve damage, blood vessel damage, failure of the prosthesis, infection and up to and including death.  Patient understand the risks, benefits and expectations and wishes to proceed with surgery.   PCP: Ocie Doyne., MD   Discharged Condition: good  Hospital Course:  Patient underwent the above stated procedure on 06/20/2018. Patient tolerated the procedure well and brought to the recovery room in good condition and subsequently to the floor.  POD #1 BP: 114/69 ; Pulse: 55 ; Temp: 97.8 F (36.6 C) ; Resp: 18 Patient reports pain as mild, pain controlled.  No events throughout the night. Already been working PT exercises in the bed.  Looking forward to progressing and getting well. Discussed plan on d/c home either Sat or Sun depending on progress and the need for PT to be discharged home safetly. Dorsiflexion/plantar flexion intact, incision: dressing C/D/I, no cellulitis present and compartment soft.   LABS  Basename    HGB     9.4  HCT     28.1   POD #2  BP: 143/65 ; Pulse: 69 ; Temp: 98 F (36.7 C) ; Resp: 17 Patient reports pain as 3 on 0-10 scale.  No SOB.  Feeling that they are doing well and ready to be discharged home. Dorsiflexion/plantar flexion intact, incision: dressing C/D/I, no cellulitis present and compartment soft. Compartment soft no dvt.  LABS  Basename    HGB     8.5  HCT     24.9    Discharge Exam: General appearance: alert, cooperative and no distress Extremities: Homans sign is negative, no sign of DVT, no edema, redness or tenderness in the calves or thighs and no ulcers, gangrene or trophic changes  Disposition:  Home with follow up in 2 weeks   Follow-up Information    Paralee Cancel, MD. Schedule an appointment as soon as possible for a visit in 2 weeks.   Specialty:  Orthopedic Surgery Contact information: [address] [place] [address] [phone]           Discharge Instructions    Call MD / Call 911    Complete by:  As directed    If you experience chest pain or shortness of breath, CALL 911 and be transported to the hospital emergency room.  If you develope a fever above 101 F, pus (white drainage) or increased drainage or redness at the wound, or calf pain, call your surgeon's office.   Constipation Prevention   Complete by:  As directed    Drink plenty of fluids.  Prune juice may be helpful.  You may use a stool softener, such as Colace (over the counter) 100 mg twice a day.  Use MiraLax (over the counter) for constipation as needed.   Diet - low sodium heart healthy   Complete by:  As directed    Increase activity slowly as tolerated   Complete by:  As directed       Allergies as of 06/22/2018      Reactions   Penicillins Shortness Of Breath, Swelling, Rash, Other (See Comments)   Has patient had a PCN reaction causing immediate rash, facial/tongue/throat swelling, SOB or lightheadedness with hypotension:Yes Has patient had a PCN reaction causing severe rash involving mucus membranes or skin necrosis: No Has patient had a PCN reaction that required hospitalization No Has patient had a PCN reaction occurring within the last 10 years: No If all of the above answers are "NO", then may proceed with Cephalosporin use.   Adhesive [tape] Other (See Comments)   Blisters skin - MUST USE PAPER TAPE   Aspirin Other (See Comments)   In high doses causes ears rings   Codeine Other (See Comments)   headache   Morphine Other (See Comments)   headache      Medication List    STOP taking these medications   traMADol 50 MG tablet Commonly known as:  ULTRAM     TAKE these medications   aspirin 81 MG chewable tablet Chew 1 tablet (81 mg total) by mouth 2 (two) times daily. Start the day after finishing Xarelto. Take for 4 weeks.   BENEFIBER Powd Take 1 Scoop by mouth daily. Mixed with miralax   CALTRATE 600+D PO Take 1 tablet by mouth daily.   carboxymethylcellulose 0.5 %  Soln Commonly known as:  REFRESH PLUS Apply 2 drops to eye at bedtime.   docusate sodium 100 MG capsule Commonly known as:  COLACE Take 1 capsule (100 mg total) by mouth 2 (two) times daily.   ferrous sulfate 325 (65 FE) MG tablet Take 1 tablet (325 mg total) by mouth 3 (three) times daily with meals.   fluticasone 50 MCG/ACT nasal spray Commonly known as:  FLONASE Place 1 spray into both nostrils daily as needed for allergies or rhinitis.   HYDROcodone-acetaminophen 7.5-325 MG tablet Commonly known as:  NORCO Take 1-2 tablets by mouth every 4 (four) hours as needed for moderate  pain.   HYDROcodone-acetaminophen 7.5-325 MG tablet Commonly known as:  NORCO Take 1-2 tablets by mouth every 4 (four) hours as needed for moderate pain.   levothyroxine 88 MCG tablet Commonly known as:  SYNTHROID, LEVOTHROID Take 88 mcg by mouth daily before breakfast.   loratadine 10 MG tablet Commonly known as:  CLARITIN Take 10 mg by mouth daily as needed for allergies.   losartan-hydrochlorothiazide 100-12.5 MG tablet Commonly known as:  HYZAAR Take 1 tablet by mouth every evening.   methocarbamol 500 MG tablet Commonly known as:  ROBAXIN Take 1 tablet (500 mg total) by mouth every 6 (six) hours as needed for muscle spasms.   metoprolol succinate 25 MG 24 hr tablet Commonly known as:  TOPROL-XL Take 25 mg by mouth daily.   multivitamin with minerals Tabs tablet Take 1 tablet by mouth daily.   polyethylene glycol packet Commonly known as:  MIRALAX / GLYCOLAX Take 17 g by mouth 2 (two) times daily. What changed:  when to take this   rivaroxaban 10 MG Tabs tablet Commonly known as:  XARELTO Take 1 tablet (10 mg total) by mouth daily for 14 days.   triamcinolone cream 0.1 % Commonly known as:  KENALOG Apply 1 application topically daily as needed (rash).   vitamin C 1000 MG tablet Take 1,000 mg by mouth 2 (two) times daily.   Vitamin D (Ergocalciferol) 1.25 MG (50000 UT) Caps  capsule Commonly known as:  DRISDOL Take 50,000 Units by mouth every Friday. Notes to patient:  Take as prescribed        Signed: [place] Pugh. Mirah Nevins   PA-C  07/16/2018, 3:40 PM

## 2018-07-17 DIAGNOSIS — M25561 Pain in right knee: Secondary | ICD-10-CM | POA: Diagnosis not present

## 2018-07-17 DIAGNOSIS — M25562 Pain in left knee: Secondary | ICD-10-CM | POA: Diagnosis not present

## 2018-07-17 DIAGNOSIS — Z96651 Presence of right artificial knee joint: Secondary | ICD-10-CM | POA: Diagnosis not present

## 2018-07-17 DIAGNOSIS — Z96652 Presence of left artificial knee joint: Secondary | ICD-10-CM | POA: Diagnosis not present

## 2018-07-22 DIAGNOSIS — Z96651 Presence of right artificial knee joint: Secondary | ICD-10-CM | POA: Diagnosis not present

## 2018-07-22 DIAGNOSIS — M25561 Pain in right knee: Secondary | ICD-10-CM | POA: Diagnosis not present

## 2018-07-22 DIAGNOSIS — Z96652 Presence of left artificial knee joint: Secondary | ICD-10-CM | POA: Diagnosis not present

## 2018-07-22 DIAGNOSIS — M25562 Pain in left knee: Secondary | ICD-10-CM | POA: Diagnosis not present

## 2018-07-24 DIAGNOSIS — Z96652 Presence of left artificial knee joint: Secondary | ICD-10-CM | POA: Diagnosis not present

## 2018-07-24 DIAGNOSIS — M25561 Pain in right knee: Secondary | ICD-10-CM | POA: Diagnosis not present

## 2018-07-24 DIAGNOSIS — M25562 Pain in left knee: Secondary | ICD-10-CM | POA: Diagnosis not present

## 2018-07-24 DIAGNOSIS — Z96651 Presence of right artificial knee joint: Secondary | ICD-10-CM | POA: Diagnosis not present

## 2018-07-29 DIAGNOSIS — M25561 Pain in right knee: Secondary | ICD-10-CM | POA: Diagnosis not present

## 2018-07-29 DIAGNOSIS — Z96652 Presence of left artificial knee joint: Secondary | ICD-10-CM | POA: Diagnosis not present

## 2018-07-29 DIAGNOSIS — M25562 Pain in left knee: Secondary | ICD-10-CM | POA: Diagnosis not present

## 2018-07-29 DIAGNOSIS — Z96651 Presence of right artificial knee joint: Secondary | ICD-10-CM | POA: Diagnosis not present

## 2018-08-05 DIAGNOSIS — Z96652 Presence of left artificial knee joint: Secondary | ICD-10-CM | POA: Diagnosis not present

## 2018-08-05 DIAGNOSIS — M25562 Pain in left knee: Secondary | ICD-10-CM | POA: Diagnosis not present

## 2018-08-05 DIAGNOSIS — M25561 Pain in right knee: Secondary | ICD-10-CM | POA: Diagnosis not present

## 2018-08-05 DIAGNOSIS — Z96651 Presence of right artificial knee joint: Secondary | ICD-10-CM | POA: Diagnosis not present

## 2018-08-07 DIAGNOSIS — M170 Bilateral primary osteoarthritis of knee: Secondary | ICD-10-CM | POA: Diagnosis not present

## 2018-08-07 DIAGNOSIS — Z4733 Aftercare following explantation of knee joint prosthesis: Secondary | ICD-10-CM | POA: Diagnosis not present

## 2018-09-24 DIAGNOSIS — M5440 Lumbago with sciatica, unspecified side: Secondary | ICD-10-CM | POA: Diagnosis not present

## 2018-09-26 DIAGNOSIS — Z79899 Other long term (current) drug therapy: Secondary | ICD-10-CM | POA: Diagnosis not present

## 2018-09-26 DIAGNOSIS — I10 Essential (primary) hypertension: Secondary | ICD-10-CM | POA: Diagnosis not present

## 2018-09-26 DIAGNOSIS — E118 Type 2 diabetes mellitus with unspecified complications: Secondary | ICD-10-CM | POA: Diagnosis not present

## 2018-09-26 DIAGNOSIS — E063 Autoimmune thyroiditis: Secondary | ICD-10-CM | POA: Diagnosis not present

## 2018-09-26 DIAGNOSIS — M519 Unspecified thoracic, thoracolumbar and lumbosacral intervertebral disc disorder: Secondary | ICD-10-CM | POA: Diagnosis not present

## 2018-09-26 DIAGNOSIS — E785 Hyperlipidemia, unspecified: Secondary | ICD-10-CM | POA: Diagnosis not present

## 2018-09-27 ENCOUNTER — Other Ambulatory Visit: Payer: Self-pay | Admitting: Neurosurgery

## 2018-09-27 DIAGNOSIS — M5440 Lumbago with sciatica, unspecified side: Secondary | ICD-10-CM

## 2018-09-30 ENCOUNTER — Ambulatory Visit
Admission: RE | Admit: 2018-09-30 | Discharge: 2018-09-30 | Disposition: A | Discharge status: Home / Self Care | Payer: Medicare Other | Source: Ambulatory Visit | Attending: Neurosurgery | Admitting: Neurosurgery

## 2018-09-30 DIAGNOSIS — L219 Seborrheic dermatitis, unspecified: Secondary | ICD-10-CM | POA: Diagnosis not present

## 2018-09-30 DIAGNOSIS — M5127 Other intervertebral disc displacement, lumbosacral region: Secondary | ICD-10-CM | POA: Diagnosis not present

## 2018-09-30 DIAGNOSIS — M4807 Spinal stenosis, lumbosacral region: Secondary | ICD-10-CM | POA: Diagnosis not present

## 2018-09-30 DIAGNOSIS — M5440 Lumbago with sciatica, unspecified side: Secondary | ICD-10-CM

## 2018-09-30 DIAGNOSIS — L638 Other alopecia areata: Secondary | ICD-10-CM | POA: Diagnosis not present

## 2018-09-30 DIAGNOSIS — M5126 Other intervertebral disc displacement, lumbar region: Secondary | ICD-10-CM | POA: Diagnosis not present

## 2018-10-03 DIAGNOSIS — M5440 Lumbago with sciatica, unspecified side: Secondary | ICD-10-CM | POA: Diagnosis not present

## 2018-10-24 DIAGNOSIS — M47816 Spondylosis without myelopathy or radiculopathy, lumbar region: Secondary | ICD-10-CM | POA: Diagnosis not present

## 2018-11-11 DIAGNOSIS — M47815 Spondylosis without myelopathy or radiculopathy, thoracolumbar region: Secondary | ICD-10-CM | POA: Diagnosis not present

## 2018-12-11 DIAGNOSIS — M47814 Spondylosis without myelopathy or radiculopathy, thoracic region: Secondary | ICD-10-CM | POA: Diagnosis not present

## 2019-01-31 DIAGNOSIS — L658 Other specified nonscarring hair loss: Secondary | ICD-10-CM | POA: Diagnosis not present

## 2019-02-25 DIAGNOSIS — L638 Other alopecia areata: Secondary | ICD-10-CM | POA: Diagnosis not present

## 2019-02-27 DIAGNOSIS — E118 Type 2 diabetes mellitus with unspecified complications: Secondary | ICD-10-CM | POA: Diagnosis not present

## 2019-02-27 DIAGNOSIS — E785 Hyperlipidemia, unspecified: Secondary | ICD-10-CM | POA: Diagnosis not present

## 2019-02-27 DIAGNOSIS — I10 Essential (primary) hypertension: Secondary | ICD-10-CM | POA: Diagnosis not present

## 2019-02-27 DIAGNOSIS — Z9181 History of falling: Secondary | ICD-10-CM | POA: Diagnosis not present

## 2019-02-27 DIAGNOSIS — E559 Vitamin D deficiency, unspecified: Secondary | ICD-10-CM | POA: Diagnosis not present

## 2019-02-27 DIAGNOSIS — Z1331 Encounter for screening for depression: Secondary | ICD-10-CM | POA: Diagnosis not present

## 2019-02-27 DIAGNOSIS — E063 Autoimmune thyroiditis: Secondary | ICD-10-CM | POA: Diagnosis not present

## 2019-03-11 DIAGNOSIS — M47814 Spondylosis without myelopathy or radiculopathy, thoracic region: Secondary | ICD-10-CM | POA: Diagnosis not present

## 2019-03-18 DIAGNOSIS — M47814 Spondylosis without myelopathy or radiculopathy, thoracic region: Secondary | ICD-10-CM | POA: Diagnosis not present

## 2019-04-28 DIAGNOSIS — Z6836 Body mass index (BMI) 36.0-36.9, adult: Secondary | ICD-10-CM | POA: Diagnosis not present

## 2019-04-28 DIAGNOSIS — M47814 Spondylosis without myelopathy or radiculopathy, thoracic region: Secondary | ICD-10-CM | POA: Diagnosis not present

## 2019-04-28 DIAGNOSIS — I10 Essential (primary) hypertension: Secondary | ICD-10-CM | POA: Diagnosis not present

## 2019-05-15 DIAGNOSIS — M47814 Spondylosis without myelopathy or radiculopathy, thoracic region: Secondary | ICD-10-CM | POA: Diagnosis not present

## 2019-05-15 DIAGNOSIS — M542 Cervicalgia: Secondary | ICD-10-CM | POA: Diagnosis not present

## 2019-05-15 DIAGNOSIS — M47816 Spondylosis without myelopathy or radiculopathy, lumbar region: Secondary | ICD-10-CM | POA: Diagnosis not present

## 2019-05-27 DIAGNOSIS — M542 Cervicalgia: Secondary | ICD-10-CM | POA: Diagnosis not present

## 2019-05-27 DIAGNOSIS — M5022 Other cervical disc displacement, mid-cervical region, unspecified level: Secondary | ICD-10-CM | POA: Diagnosis not present

## 2019-05-27 DIAGNOSIS — M5124 Other intervertebral disc displacement, thoracic region: Secondary | ICD-10-CM | POA: Diagnosis not present

## 2019-05-27 DIAGNOSIS — M47816 Spondylosis without myelopathy or radiculopathy, lumbar region: Secondary | ICD-10-CM | POA: Diagnosis not present

## 2019-05-27 DIAGNOSIS — M4804 Spinal stenosis, thoracic region: Secondary | ICD-10-CM | POA: Diagnosis not present

## 2019-05-27 DIAGNOSIS — M4807 Spinal stenosis, lumbosacral region: Secondary | ICD-10-CM | POA: Diagnosis not present

## 2019-05-27 DIAGNOSIS — M47814 Spondylosis without myelopathy or radiculopathy, thoracic region: Secondary | ICD-10-CM | POA: Diagnosis not present

## 2019-05-27 DIAGNOSIS — M47812 Spondylosis without myelopathy or radiculopathy, cervical region: Secondary | ICD-10-CM | POA: Diagnosis not present

## 2019-05-27 DIAGNOSIS — M5127 Other intervertebral disc displacement, lumbosacral region: Secondary | ICD-10-CM | POA: Diagnosis not present

## 2019-05-29 DIAGNOSIS — Z6837 Body mass index (BMI) 37.0-37.9, adult: Secondary | ICD-10-CM | POA: Diagnosis not present

## 2019-05-29 DIAGNOSIS — M5126 Other intervertebral disc displacement, lumbar region: Secondary | ICD-10-CM | POA: Diagnosis not present

## 2019-05-29 DIAGNOSIS — I10 Essential (primary) hypertension: Secondary | ICD-10-CM | POA: Diagnosis not present

## 2019-06-02 IMAGING — NM NM BONE WHOLE BODY
2 series · 2 of 2 positions shown · non-contrast
Comparison: 12/20/2016 and 11/30/2016

CLINICAL DATA: Evaluate sacral lesion. Evaluate for metastatic
disease

EXAM:
NUCLEAR MEDICINE WHOLE BODY BONE SCAN
TECHNIQUE: Whole body anterior and posterior images were obtained approximately
3 hours after intravenous injection of radiopharmaceutical.
RADIOPHARMACEUTICALS:  21.3 mCi Gechnetium-22m MDP IV

[Series 1: wbr_bone_60 whole body · 2.66mm/px · 1 of 1 slices shown (1 of 2)]
[im 1/1]
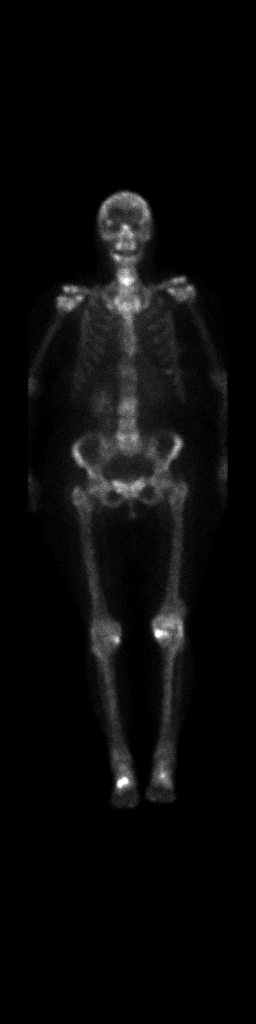

[Series 1: wbr_bone_60 whole body · 2.66mm/px · 1 of 1 slices shown (2 of 2)]
[im 1/1]
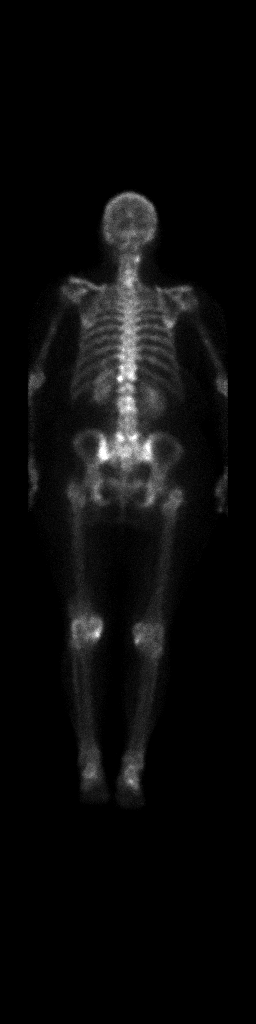

[2 of 2 positions shown; findings below may reference images not displayed]

FINDINGS: Degenerative type changes are identified within the cervical spine
as well as the lower thoracic and lumbar spine. No abnormal foci of
increased radiotracer activity identified within the sacrum to
correspond to the signal abnormality identified on recent MRI. There
is a small asymmetric focus of increased uptake just above the right
orbit. A second subtle focus of increased radiotracer activity is
identified within the left frontoparietal calvarium. Normal
physiologic tracer activity is seen within the kidneys an urinary
bladder.
IMPRESSION: 1. No specific findings identified to suggest diffuse metastatic
disease and there is no abnormal focus corresponding to the abnormal
signal seen on MRI.
2. There are 2 foci of increased uptake within the skull which are
nonspecific but favored to represent benign etiology such as
calcified meningioma. Recommend further evaluation with CT of the
brain to confirm.
3. Extensive degenerative changes noted within the cervical,
thoracic and lumbar spine.

## 2019-07-08 DIAGNOSIS — M47814 Spondylosis without myelopathy or radiculopathy, thoracic region: Secondary | ICD-10-CM | POA: Diagnosis not present

## 2019-07-24 DIAGNOSIS — M47814 Spondylosis without myelopathy or radiculopathy, thoracic region: Secondary | ICD-10-CM | POA: Diagnosis not present

## 2019-10-02 DIAGNOSIS — Z1231 Encounter for screening mammogram for malignant neoplasm of breast: Secondary | ICD-10-CM | POA: Diagnosis not present

## 2019-10-02 DIAGNOSIS — Z01419 Encounter for gynecological examination (general) (routine) without abnormal findings: Secondary | ICD-10-CM | POA: Diagnosis not present

## 2019-10-04 IMAGING — RF DG C-ARM 61-120 MIN
1 series · 2 of 2 positions shown · non-contrast
Comparison: None

FLUOROSCOPY TIME:  2 minutes 55 seconds

CLINICAL DATA: L2-3 XLIF

EXAM:
LUMBAR SPINE - 2-3 VIEW; DG C-ARM 61-120 MIN

[Series 1: run · 2 of 2 slices shown]
[im 1/2]
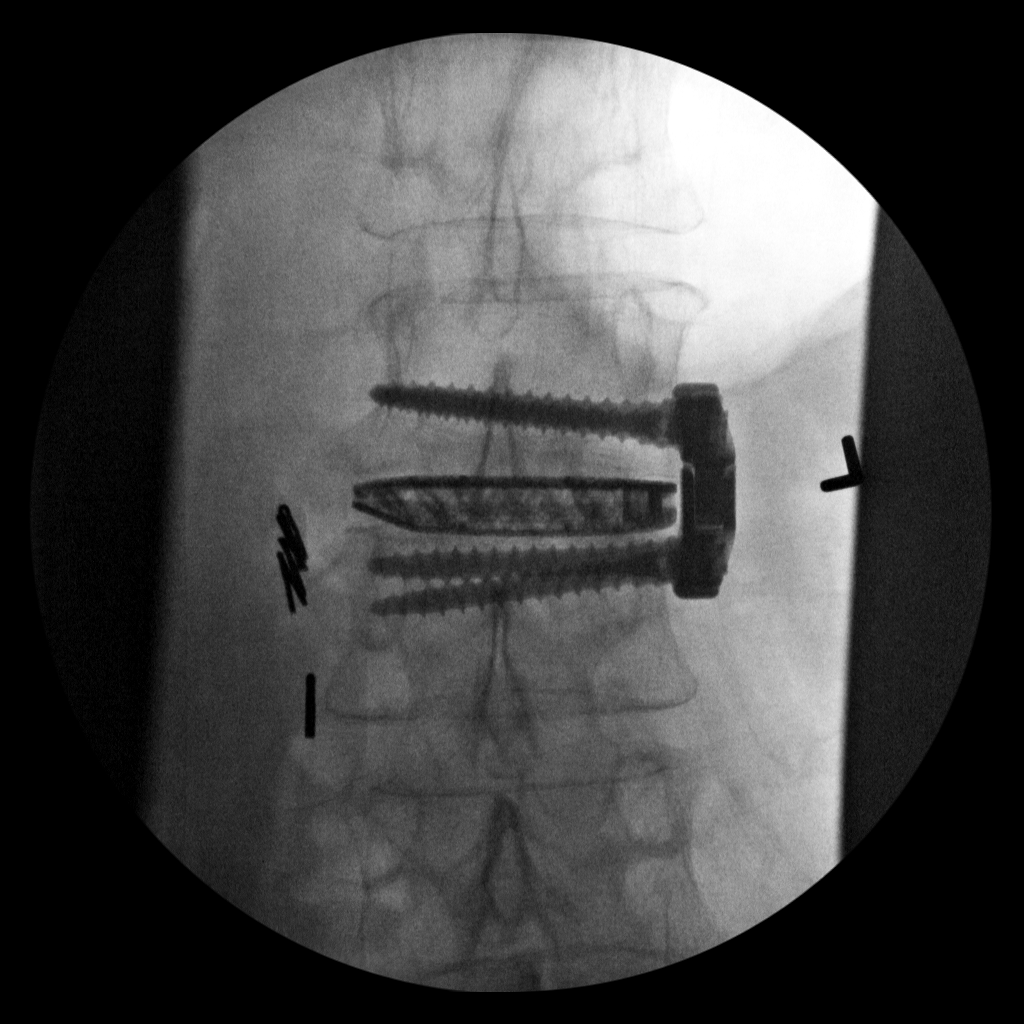
[im 2/2]
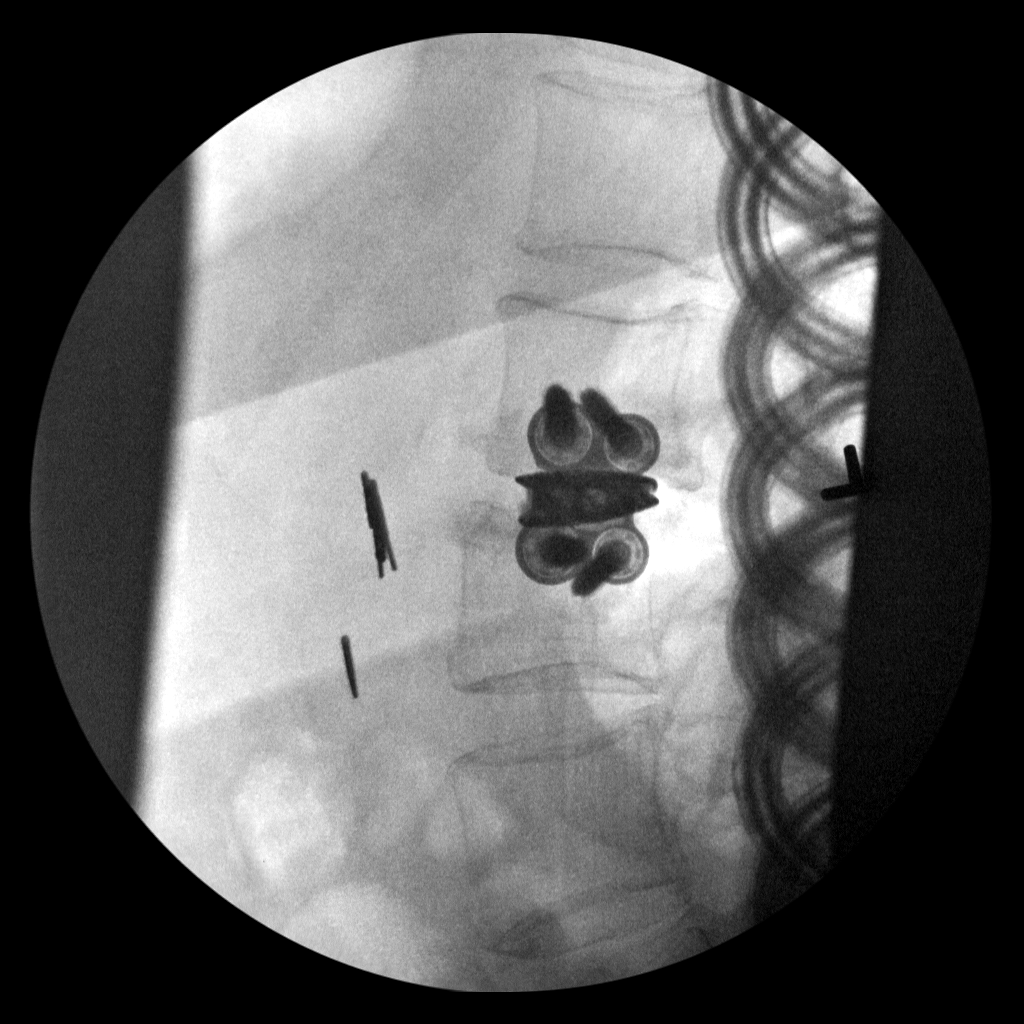

[2 of 2 positions shown; findings below may reference images not displayed]

FINDINGS: Extreme lateral interbody fusion at L2-3 with interbody spacer in
satisfactory position. No hardware failure complication.
IMPRESSION: Interval XLIF at L2-3.

## 2020-01-01 ENCOUNTER — Telehealth: Payer: Self-pay | Admitting: Internal Medicine

## 2020-01-01 NOTE — Telephone Encounter (Signed)
Erica Calhoun.

## 2020-01-01 NOTE — Telephone Encounter (Signed)
Dr. Henrene Pastor, pt had her previous colonoscopy report faxed and requested you as a GI MD.  She stated that she no longer feels comfortable with Dr. Melina Copa.  Previous colon and path report will be sent to you for review.  Please advise recall.

## 2020-01-02 NOTE — Telephone Encounter (Signed)
I don't think I've seen this

## 2020-01-05 ENCOUNTER — Encounter: Payer: Self-pay | Admitting: Internal Medicine

## 2020-01-05 NOTE — Telephone Encounter (Signed)
GI ATTENDING  Outside records reviewed.  Colonoscopy with Dr. Melina Copa in [place] July 30, 2017.  Findings: 1.  Multiple adenomatous colon polyps including 1.5 cm lesion removed piecemeal 2.  Melanosis coli  Based on the above.  The patient is appropriate for follow-up surveillance colonoscopy at this time.  Please arrange office visit with me given her medical history.  I can arrange her surveillance colonoscopy thereafter.  Thank you.

## 2020-01-07 DIAGNOSIS — Z79899 Other long term (current) drug therapy: Secondary | ICD-10-CM | POA: Diagnosis not present

## 2020-01-07 DIAGNOSIS — E559 Vitamin D deficiency, unspecified: Secondary | ICD-10-CM | POA: Diagnosis not present

## 2020-01-07 DIAGNOSIS — E118 Type 2 diabetes mellitus with unspecified complications: Secondary | ICD-10-CM | POA: Diagnosis not present

## 2020-01-07 DIAGNOSIS — E785 Hyperlipidemia, unspecified: Secondary | ICD-10-CM | POA: Diagnosis not present

## 2020-01-07 DIAGNOSIS — I10 Essential (primary) hypertension: Secondary | ICD-10-CM | POA: Diagnosis not present

## 2020-01-07 DIAGNOSIS — E063 Autoimmune thyroiditis: Secondary | ICD-10-CM | POA: Diagnosis not present

## 2020-02-09 ENCOUNTER — Ambulatory Visit (INDEPENDENT_AMBULATORY_CARE_PROVIDER_SITE_OTHER): Payer: Medicare Other | Admitting: Internal Medicine

## 2020-02-09 ENCOUNTER — Encounter: Payer: Self-pay | Admitting: Internal Medicine

## 2020-02-09 VITALS — BP 126/70 | HR 78 | Ht 63.0 in | Wt 218.4 lb

## 2020-02-09 DIAGNOSIS — Z86010 Personal history of colonic polyps: Secondary | ICD-10-CM

## 2020-02-09 DIAGNOSIS — R1310 Dysphagia, unspecified: Secondary | ICD-10-CM

## 2020-02-09 DIAGNOSIS — R194 Change in bowel habit: Secondary | ICD-10-CM

## 2020-02-09 DIAGNOSIS — K219 Gastro-esophageal reflux disease without esophagitis: Secondary | ICD-10-CM | POA: Diagnosis not present

## 2020-02-09 NOTE — Progress Notes (Signed)
HISTORY OF PRESENT ILLNESS:  Erica Calhoun is a 69 y.o. female self-referred with myriad of GI complaints and to establish as a new GI patient.  Previously under the care of Dr. Nehemiah Settle in [facility].  Some records have been obtained and reviewed.  Patient self notes possible history of cyst below including multiple back surgeries.  Her main complaint today is that of intermittent liquid dysphagia over the past year.  She also describes significant throat pain while singing.  She has not been seen by throat specialist regarding this complaint.  She did have cervical disc surgery in 2018.  She denies solid food dysphagia.  She does have chronic intermittent reflux symptoms which are untreated.  Her weight has fluctuated.  She lost 40 pounds and gained it back in its entirety.  She does have chronic constipation for which she takes combination of Benefiber and MiraLAX.  She describes bowel habits as explosive gaseous.  She reports a brother and half sister with amyloidosis.  Patient self last underwent colonoscopy November 2018.  She was found to have multiple (6) adenomatous colon polyps as well as a 15 mm adenoma which was removed piecemeal.  As well, melanosis coli.  Follow-up in about 3 years recommended.  Review of outside blood work from May 2021 shows normal liver tests.  Normal comprehensive metabolic panel.  Normal CBC with hemoglobin 14.8.  Hemoglobin A1c 7.3.  He has not received her COVID-19 vaccination and at this point is not interested in such.  REVIEW OF SYSTEMS:  All non-GI ROS negative unless otherwise stated in the HPI except for arthritis, back pain, fatigue, heart murmur, itching, shortness of breath, voice change, ankle swelling  Past Medical History:  Diagnosis Date   Aortic stenosis    Arthritis    cervical and lower back    Chest pain    s/p LHC 11/28/16: 10% mLAd, dLAD, mCx, EF 60%, very mild AS (peak grad 16) - cardiologist Dr. Wynonia Lawman   Dyslipidemia     Fibromyalgia    Heart murmur    mild AS 11/2016 cath   History of recurrent UTIs    Hypertension    Hypothyroidism    Leaky heart valve    recent visit with cardiologist, okay'd for surgery   Pneumonia    hx/1/17   Ureter obstruction    right side, back in 1980's    No problems since    Past Surgical History:  Procedure Laterality Date   ANTERIOR CERVICAL DECOMP/DISCECTOMY FUSION N/A 10/02/2016   Procedure: Anterior Cervical Diskectomy and Fusion - Cervical four- Cervical five - Cervical five-Cervical six - Cervical six -Cervical seven;  Surgeon: Kary Kos, MD;  Location: [place];  Service: Neurosurgery;  Laterality: N/A;   ANTERIOR LAT LUMBAR FUSION Left 11/05/2017   Procedure: LUMBAR TWO-THREE ANTERIOR LATERAL LUMBAR FUSION WITH LATERAL PLATE;  Surgeon: Kary Kos, MD;  Location: [place];  Service: Neurosurgery;  Laterality: Left;   APPENDECTOMY  1978   BUNIONECTOMY Bilateral    CARDIAC CATHETERIZATION     2018 with Dr. Wynonia Lawman   cyst on ovary     left ovary.Erica Calhoun N/A 01/23/2018   Procedure: CYSTOSCOPY;  Surgeon: Paula Compton, MD;  Location: [facility];  Service: Gynecology;  Laterality: N/A;   JOINT REPLACEMENT     bilateral total knees Dr. Alvan Dame 06-20-18   LAPAROSCOPIC VAGINAL HYSTERECTOMY WITH SALPINGO OOPHORECTOMY Bilateral 01/23/2018   Procedure: LAPAROSCOPIC ASSISTED VAGINAL HYSTERECTOMY WITH SALPINGO OOPHORECTOMY;  Surgeon: Paula Compton,  MD;  Location: [facility];  Service: Gynecology;  Laterality: Bilateral;   LEFT HEART CATH AND CORONARY ANGIOGRAPHY N/A 11/28/2016   Procedure: Left Heart Cath and Coronary Angiography;  Surgeon: Troy Sine, MD;  Location: [facility];  Service: Cardiovascular;  Laterality: N/A;   TOTAL KNEE ARTHROPLASTY Bilateral 06/20/2018   Procedure: TOTAL KNEE BILATERAL;  Surgeon: Paralee Cancel, MD;  Location: [facility];  Service: Orthopedics;  Laterality: Bilateral;  2.5 hrs   TUBAL LIGATION     wisdon teeth       Social History Erica Calhoun  reports that she has never smoked. She has never used smokeless tobacco. She reports that she does not drink alcohol or use drugs.  family history includes Bladder Cancer in her brother and father; Colon cancer in her maternal grandmother; Goiter in her mother.  Allergies  Allergen Reactions   Penicillins Shortness Of Breath, Swelling, Rash and Other (See Comments)    Has patient had a PCN reaction causing immediate rash, facial/tongue/throat swelling, SOB or lightheadedness with hypotension:Yes Has patient had a PCN reaction causing severe rash involving mucus membranes or skin necrosis: No Has patient had a PCN reaction that required hospitalization No Has patient had a PCN reaction occurring within the last 10 years: No If all of the above answers are "NO", then may proceed with Cephalosporin use.    Adhesive [Tape] Other (See Comments)    Blisters skin - MUST USE PAPER TAPE   Aspirin Other (See Comments)    In high doses causes ears rings    Codeine Other (See Comments)    headache   Morphine Other (See Comments)    headache       PHYSICAL EXAMINATION: Vital signs: BP 126/70    Pulse 78    Ht _0  (1.6 m)    Wt 218 lb 6 oz (99.1 kg)    BMI 38.68 kg/m   Constitutional: Overweight, generally well-appearing, no acute distress Psychiatric: alert and oriented x3, cooperative Eyes: extraocular movements intact, anicteric, conjunctiva pink Mouth: oral pharynx moist, no lesions Neck: supple no lymphadenopathy Cardiovascular: heart regular rate and rhythm, systolic murmur heard best at the right upper sternal border murmur Lungs: clear to auscultation bilaterally Abdomen: soft, obese, nontender, nondistended, no obvious ascites, no peritoneal signs, normal bowel sounds, no organomegaly Rectal: Deferred until colonoscopy Extremities: no clubbing or cyanosis.  Trace lower extremity edema bilaterally Skin: no lesions on visible  extremities Neuro: No focal deficits.  Cranial nerves intact  ASSESSMENT:  1.  Chronic GERD.  Untreated 2.  Complaints of throat pain while singing.  Not clear to me this is a primary GI problem 3.  Vague liquid dysphagia 1 years duration. 4.  History of multiple adenomatous colon polyps melanosis coli with Dr. Melina Copa 2018.  Due for follow-up this year. 5.  Chronic constipation.  Ongoing 6.  Obesity 7.  Reports family history of amyloidosis   PLAN:  1.  Reflux precautions with attention to weight loss. 2.  Prescribe pantoprazole 40 mg daily.  Medication risks reviewed 3.  Schedule upper endoscopy to evaluate chronic GERD dysphagia.The nature of the procedure, as well as the risks, benefits, and alternatives were carefully and thoroughly reviewed with the patient. Ample time for discussion and questions allowed. The patient understood, was satisfied, and agreed to proceed. 4.  Schedule surveillance colonoscopy.The nature of the procedure, as well as the risks, benefits, and alternatives were carefully and thoroughly reviewed with the patient. Ample time for discussion  and questions allowed. The patient understood, was satisfied, and agreed to proceed. 5.  Consider modified barium swallow to rule out oropharyngeal dysphagia if EGD negative. 6.  May need ENT assessment for throat discomfort.  Would defer to her PCP to arrange such evaluation. Total time of 60 minutes was spent preparing to see the patient, reviewing previous test results, endoscopy results, pathology, surgeries, cardiology evaluations.  Also obtaining comprehensive history and performing comprehensive physical examination.  Next, counseling the patient regarding her above listed multiple issues and ordering medications and advanced procedures.  Finally, documenting clinical information in the health record

## 2020-02-09 NOTE — Patient Instructions (Signed)
You have been scheduled for an endoscopy and colonoscopy. Please follow the written instructions given to you at your visit today. Please pick up your prep supplies at the pharmacy within the next 1-3 days. If you use inhalers (even only as needed), please bring them with you on the day of your procedure.  

## 2020-02-11 ENCOUNTER — Telehealth: Payer: Self-pay | Admitting: Internal Medicine

## 2020-02-11 MED ORDER — PANTOPRAZOLE SODIUM 40 MG PO TBEC: 40.0000 mg | DELAYED_RELEASE_TABLET | Freq: Every day | ORAL | 3 refills | Status: DC

## 2020-02-11 NOTE — Telephone Encounter (Signed)
Sent Pantoprazole to pharmacy °

## 2020-04-02 ENCOUNTER — Ambulatory Visit (INDEPENDENT_AMBULATORY_CARE_PROVIDER_SITE_OTHER): Payer: Medicare Other

## 2020-04-02 ENCOUNTER — Other Ambulatory Visit: Payer: Self-pay | Admitting: Internal Medicine

## 2020-04-02 DIAGNOSIS — Z1159 Encounter for screening for other viral diseases: Secondary | ICD-10-CM | POA: Diagnosis not present

## 2020-04-03 LAB — SARS CORONAVIRUS 2 (TAT 6-24 HRS): SARS Coronavirus 2: NEGATIVE

## 2020-04-06 ENCOUNTER — Ambulatory Visit (AMBULATORY_SURGERY_CENTER): Payer: Medicare Other | Admitting: Internal Medicine

## 2020-04-06 ENCOUNTER — Other Ambulatory Visit: Payer: Self-pay

## 2020-04-06 ENCOUNTER — Encounter: Payer: Self-pay | Admitting: Internal Medicine

## 2020-04-06 VITALS — BP 145/86 | HR 60 | Temp 97.9°F | Resp 14 | Ht 63.0 in | Wt 218.0 lb

## 2020-04-06 DIAGNOSIS — K6289 Other specified diseases of anus and rectum: Secondary | ICD-10-CM | POA: Diagnosis not present

## 2020-04-06 DIAGNOSIS — R1310 Dysphagia, unspecified: Secondary | ICD-10-CM

## 2020-04-06 DIAGNOSIS — Z86010 Personal history of colonic polyps: Secondary | ICD-10-CM | POA: Diagnosis not present

## 2020-04-06 DIAGNOSIS — D125 Benign neoplasm of sigmoid colon: Secondary | ICD-10-CM | POA: Diagnosis not present

## 2020-04-06 DIAGNOSIS — D123 Benign neoplasm of transverse colon: Secondary | ICD-10-CM

## 2020-04-06 DIAGNOSIS — K259 Gastric ulcer, unspecified as acute or chronic, without hemorrhage or perforation: Secondary | ICD-10-CM

## 2020-04-06 DIAGNOSIS — R1084 Generalized abdominal pain: Secondary | ICD-10-CM

## 2020-04-06 DIAGNOSIS — D122 Benign neoplasm of ascending colon: Secondary | ICD-10-CM

## 2020-04-06 DIAGNOSIS — K219 Gastro-esophageal reflux disease without esophagitis: Secondary | ICD-10-CM | POA: Diagnosis not present

## 2020-04-06 DIAGNOSIS — K5730 Diverticulosis of large intestine without perforation or abscess without bleeding: Secondary | ICD-10-CM | POA: Diagnosis not present

## 2020-04-06 DIAGNOSIS — K449 Diaphragmatic hernia without obstruction or gangrene: Secondary | ICD-10-CM | POA: Diagnosis not present

## 2020-04-06 DIAGNOSIS — R194 Change in bowel habit: Secondary | ICD-10-CM

## 2020-04-06 DIAGNOSIS — K5909 Other constipation: Secondary | ICD-10-CM

## 2020-04-06 MED ORDER — SODIUM CHLORIDE 0.9 % IV SOLN: 500.0000 mL | Freq: Once | INTRAVENOUS | Status: DC

## 2020-04-06 MED ORDER — PANTOPRAZOLE SODIUM 40 MG PO TBEC: 40.0000 mg | DELAYED_RELEASE_TABLET | Freq: Every day | ORAL | 11 refills | Status: DC

## 2020-04-06 NOTE — Patient Instructions (Signed)
Handouts provided on polyps, diverticulosis, hiatal hernia and Reflux precautions.   Start taking Pantoprazole 40mg  daily. Prescription sent to your pharmacy.   Follow Reflux precautions-see handout.   Routine office follow-up with Dr. Henrene Pastor in 6 weeks.   YOU HAD AN ENDOSCOPIC PROCEDURE TODAY AT [facility]:   Refer to the procedure report that was given to you for any specific questions about what was found during the examination.  If the procedure report does not answer your questions, please call your gastroenterologist to clarify.  If you requested that your care partner not be given the details of your procedure findings, then the procedure report has been included in a sealed envelope for you to review at your convenience later.  YOU SHOULD EXPECT: Some feelings of bloating in the abdomen. Passage of more gas than usual.  Walking can help get rid of the air that was put into your GI tract during the procedure and reduce the bloating. If you had a lower endoscopy (such as a colonoscopy or flexible sigmoidoscopy) you may notice spotting of blood in your stool or on the toilet paper. If you underwent a bowel prep for your procedure, you may not have a normal bowel movement for a few days.  Please Note:  You might notice some irritation and congestion in your nose or some drainage.  This is from the oxygen used during your procedure.  There is no need for concern and it should clear up in a day or so.  SYMPTOMS TO REPORT IMMEDIATELY:   Following lower endoscopy (colonoscopy or flexible sigmoidoscopy):  Excessive amounts of blood in the stool  Significant tenderness or worsening of abdominal pains  Swelling of the abdomen that is new, acute  Fever of 100F or higher   Following upper endoscopy (EGD)  Vomiting of blood or coffee ground material  New chest pain or pain under the shoulder blades  Painful or persistently difficult swallowing  New shortness of breath  Fever  of 100F or higher  Black, tarry-looking stools  For urgent or emergent issues, a gastroenterologist can be reached at any hour by calling [phone]. Do not use MyChart messaging for urgent concerns.    DIET:  We do recommend a small meal at first, but then you may proceed to your regular diet.  Drink plenty of fluids but you should avoid alcoholic beverages for 24 hours.  ACTIVITY:  You should plan to take it easy for the rest of today and you should NOT DRIVE or use heavy machinery until tomorrow (because of the sedation medicines used during the test).    FOLLOW UP: Our staff will call the number listed on your records 48-72 hours following your procedure to check on you and address any questions or concerns that you may have regarding the information given to you following your procedure. If we do not reach you, we will leave a message.  We will attempt to reach you two times.  During this call, we will ask if you have developed any symptoms of COVID 19. If you develop any symptoms (ie: fever, flu-like symptoms, shortness of breath, cough etc.) before then, please call [phone].  If you test positive for Covid 19 in the 2 weeks post procedure, please call and report this information to [place].    If any biopsies were taken you will be contacted by phone or by letter within the next 1-3 weeks.  Please call us at [phone] if you have not  heard about the biopsies in 3 weeks.    SIGNATURES/CONFIDENTIALITY: You and/or your care partner have signed paperwork which will be entered into your electronic medical record.  These signatures attest to the fact that that the information above on your After Visit Summary has been reviewed and is understood.  Full responsibility of the confidentiality of this discharge information lies with you and/or your care-partner.

## 2020-04-06 NOTE — Progress Notes (Signed)
V/S-CW  Check-in-MG

## 2020-04-06 NOTE — Progress Notes (Signed)
Called to room to assist during endoscopic procedure.  Patient ID and intended procedure confirmed with present staff. Received instructions for my participation in the procedure from the performing physician.  

## 2020-04-06 NOTE — Op Note (Signed)
[place] Patient Name: Erica Calhoun Procedure Date: 04/06/2020 11:16 AM MRN: [id number] Endoscopist: Docia Chuck. Henrene Pastor , MD Age: 69 Referring MD:  Date of Birth: May 01, 1951 Gender: Female Account #: [id number] Procedure:                Colonoscopy with cold snare polypectomy x 6 Indications:              High risk colon cancer surveillance: Personal                            history of adenoma (10 mm or greater in size), High                            risk colon cancer surveillance: Personal history of                            multiple (3 or more) adenomas. Also, chronic                            constipation, abdominal complaints, family history                            of amyloidosis Medicines:                Monitored Anesthesia Care Procedure:                Pre-Anesthesia Assessment:                           - Prior to the procedure, a History and Physical                            was performed, and patient medications and                            allergies were reviewed. The patient's tolerance of                            previous anesthesia was also reviewed. The risks                            and benefits of the procedure and the sedation                            options and risks were discussed with the patient.                            All questions were answered, and informed consent                            was obtained. Prior Anticoagulants: The patient has                            taken no previous anticoagulant or antiplatelet  agents. ASA Grade Assessment: II - A patient with                            mild systemic disease. After reviewing the risks                            and benefits, the patient was deemed in                            satisfactory condition to undergo the procedure.                           After obtaining informed consent, the colonoscope                            was passed under  direct vision. Throughout the                            procedure, the patient's blood pressure, pulse, and                            oxygen saturations were monitored continuously. The                            Colonoscope was introduced through the anus and                            advanced to the the cecum, identified by                            appendiceal orifice and ileocecal valve. The                            ileocecal valve, appendiceal orifice, and rectum                            were photographed. The quality of the bowel                            preparation was excellent. The colonoscopy was                            performed without difficulty. The patient tolerated                            the procedure well. The bowel preparation used was                            SUPREP via split dose instruction. Scope In: 11:43:59 AM Scope Out: 12:05:01 PM Scope Withdrawal Time: 0 hours 14 minutes 50 seconds  Total Procedure Duration: 0 hours 21 minutes 2 seconds  Findings:                 Six polyps were found in the sigmoid colon,  transverse colon, ascending colon and cecum. The                            polyps were 1 to 5 mm in size. These polyps were                            removed with a cold snare. Resection and retrieval                            were complete.                           A diffuse area of mild melanosis was found in the                            entire colon.                           Multiple diverticula were found in the sigmoid                            colon. There was fixed stenosis in the rectosigmoid                            region making passage of the adult colonoscope                            somewhat difficult.                           The entire examined colon appeared otherwise normal                            on direct and retroflexion views. Biopsies from the                            rectum  were taken with a cold forceps for histology                            (rule out amyloid). Complications:            No immediate complications. Estimated blood loss:                            None. Estimated Blood Loss:     Estimated blood loss: none. Impression:               - Six 1 to 5 mm polyps in the sigmoid colon, in the                            transverse colon, in the ascending colon and in the                            cecum, removed with a cold snare. Resected and  retrieved.                           - Melanosis in the colon.                           - Diverticulosis in the sigmoid colon. RECTOSIGMOID                            STENOSIS.                           - The entire examined colon is otherwise normal on                            direct and retroflexion views. Recommendation:           - Repeat colonoscopy in 5 years for surveillance.                           - Patient has a contact number available for                            emergencies. The signs and symptoms of potential                            delayed complications were discussed with the                            patient. Return to normal activities tomorrow.                            Written discharge instructions were provided to the                            patient.                           - Resume previous diet.                           - Continue present medications.                           - Await pathology results.                           - EGD today. Please see report regarding findings                            and final recommendations Charne Mcbrien N. Henrene Pastor, MD 04/06/2020 12:23:18 PM This report has been signed electronically.

## 2020-04-06 NOTE — Progress Notes (Signed)
pt tolerated well. VSS. awake and to recovery. Report given to RN. Bite block inserted and removed without trauma. 

## 2020-04-06 NOTE — Op Note (Signed)
[place] Patient Name: Erica Calhoun Procedure Date: 04/06/2020 11:15 AM MRN: [id number] Endoscopist: Docia Chuck. Henrene Pastor , MD Age: 69 Referring MD:  Date of Birth: Jan 07, 1951 Gender: Female Account #: [id number] Procedure:                Upper GI endoscopy with biopsies Indications:              Dysphagia (vague and liquid), Esophageal reflux,                            sore throat with singing Medicines:                Monitored Anesthesia Care Procedure:                Pre-Anesthesia Assessment:                           - Prior to the procedure, a History and Physical                            was performed, and patient medications and                            allergies were reviewed. The patient's tolerance of                            previous anesthesia was also reviewed. The risks                            and benefits of the procedure and the sedation                            options and risks were discussed with the patient.                            All questions were answered, and informed consent                            was obtained. Prior Anticoagulants: The patient has                            taken no previous anticoagulant or antiplatelet                            agents. ASA Grade Assessment: II - A patient with                            mild systemic disease. After reviewing the risks                            and benefits, the patient was deemed in                            satisfactory condition to undergo the procedure.  After obtaining informed consent, the endoscope was                            passed under direct vision. Throughout the                            procedure, the patient's blood pressure, pulse, and                            oxygen saturations were monitored continuously. The                            Endoscope was introduced through the mouth, and                            advanced to the  second part of duodenum. The upper                            GI endoscopy was accomplished without difficulty.                            The patient tolerated the procedure well. Scope In: Scope Out: Findings:                 The esophagus was normal.                           The stomach revealed a small hiatal hernia and                            several superficial antral erosions. Biopsies were                            taken with a cold forceps for Helicobacter pylori                            testing using CLOtest.                           The examined duodenum was normal.                           The cardia and gastric fundus were normal on                            retroflexion. Complications:            No immediate complications. Estimated Blood Loss:     Estimated blood loss: none. Impression:               1. GERD                           2. Few gastric erosions. Otherwise normal EGD                           3. Vague dysphagia. Normal  esophagus. Recommendation:           1. Reflux precautions with attention to dietary                            measures and weight loss                           2. Prescribe pantoprazole 40 mg daily; #30; 11                            refills. PLEASE CONFIRM THE PATIENT'S PHARMACY.                            Thank you                           3. Follow-up biopsies                           4. Routine office follow-up with Dr. Henrene Pastor in 6                            weeks Docia Chuck. Henrene Pastor, MD 04/06/2020 12:27:44 PM This report has been signed electronically.

## 2020-04-07 LAB — HELICOBACTER PYLORI SCREEN-BIOPSY: UREASE: NEGATIVE

## 2020-04-08 ENCOUNTER — Encounter: Payer: Self-pay | Admitting: Internal Medicine

## 2020-04-08 ENCOUNTER — Telehealth: Payer: Self-pay | Admitting: *Deleted

## 2020-04-08 NOTE — Telephone Encounter (Signed)
  Follow up Call-  Call back number 04/06/2020  Post procedure Call Back phone  # [phone]  Permission to leave phone message Yes  Some recent data might be hidden     Patient questions:  Do you have a fever, pain , or abdominal swelling? No. Pain Score  0 *  Have you tolerated food without any problems? Yes.    Have you been able to return to your normal activities? Yes.    Do you have any questions about your discharge instructions: Diet   No. Medications  No. Follow up visit  No.  Do you have questions or concerns about your Care? No.  Actions: * If pain score is 4 or above: No action needed, pain <4  1. Have you developed a fever since your procedure? NO  2.   Have you had an respiratory symptoms (SOB or cough) since your procedure? NO  3.   Have you tested positive for COVID 19 since your procedure NO  4.   Have you had any family members/close contacts diagnosed with the COVID 19 since your procedure?  NO   If yes to any of these questions please route to Joylene John, RN and Erenest Rasher, RN

## 2020-05-18 ENCOUNTER — Ambulatory Visit: Payer: Medicare Other | Admitting: Internal Medicine

## 2020-07-12 DIAGNOSIS — M1990 Unspecified osteoarthritis, unspecified site: Secondary | ICD-10-CM | POA: Diagnosis not present

## 2020-07-12 DIAGNOSIS — E063 Autoimmune thyroiditis: Secondary | ICD-10-CM | POA: Diagnosis not present

## 2020-07-12 DIAGNOSIS — M79671 Pain in right foot: Secondary | ICD-10-CM | POA: Diagnosis not present

## 2020-07-12 DIAGNOSIS — E118 Type 2 diabetes mellitus with unspecified complications: Secondary | ICD-10-CM | POA: Diagnosis not present

## 2020-07-12 DIAGNOSIS — E559 Vitamin D deficiency, unspecified: Secondary | ICD-10-CM | POA: Diagnosis not present

## 2020-07-12 DIAGNOSIS — E785 Hyperlipidemia, unspecified: Secondary | ICD-10-CM | POA: Diagnosis not present

## 2020-07-12 DIAGNOSIS — I10 Essential (primary) hypertension: Secondary | ICD-10-CM | POA: Diagnosis not present

## 2020-07-12 DIAGNOSIS — M25561 Pain in right knee: Secondary | ICD-10-CM | POA: Diagnosis not present

## 2020-08-28 IMAGING — CT CT L SPINE W/O CM
3 of 4 series · 13 of 33 positions shown, 16 images · non-contrast
Comparison: MRI of the lumbar spine 10/23/2017

CLINICAL DATA: Low back pain for couple of months.

EXAM:
CT LUMBAR SPINE WITHOUT CONTRAST
TECHNIQUE: Multidetector CT imaging of the lumbar spine was performed without
intravenous contrast administration. Multiplanar CT image
reconstructions were also generated.

[Series 3: l-spine 2.00 br40 s3 lspine st · axial · 0.34mm/px · z∈[+1415,+1575]mm · 5 of 121 slices shown, 7 images]
[im 21/121  soft-tissue]
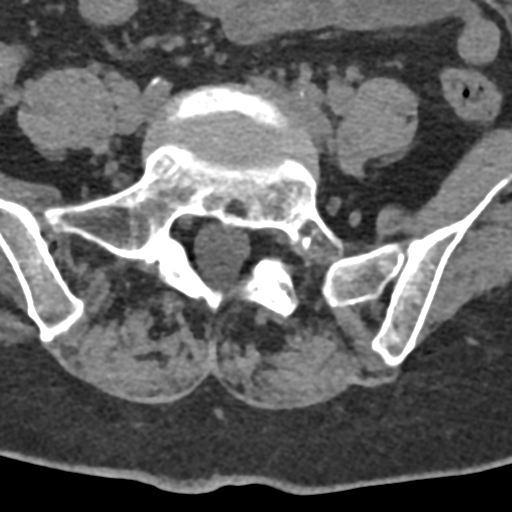
[im 21/121  bone]
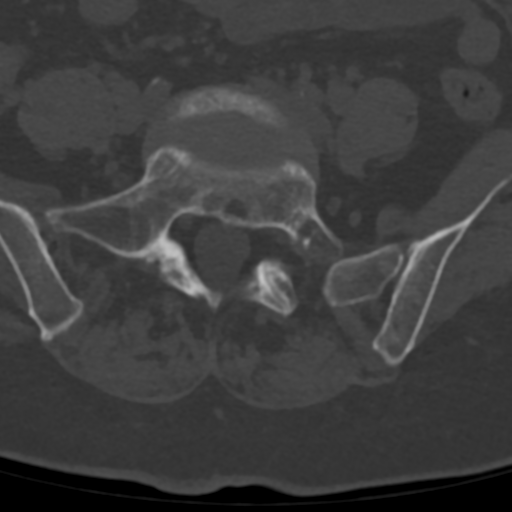
[im 41/121  bone]
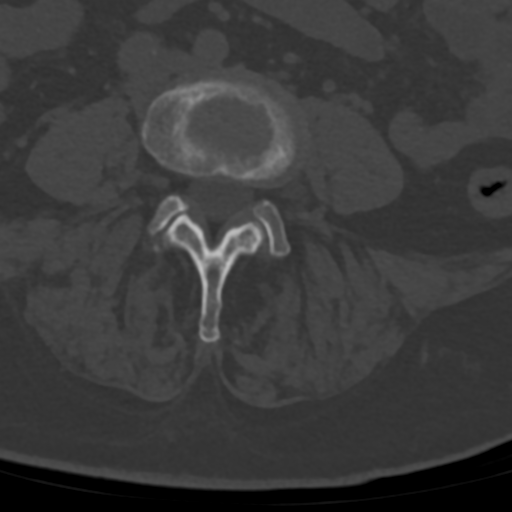
[im 61/121  bone]
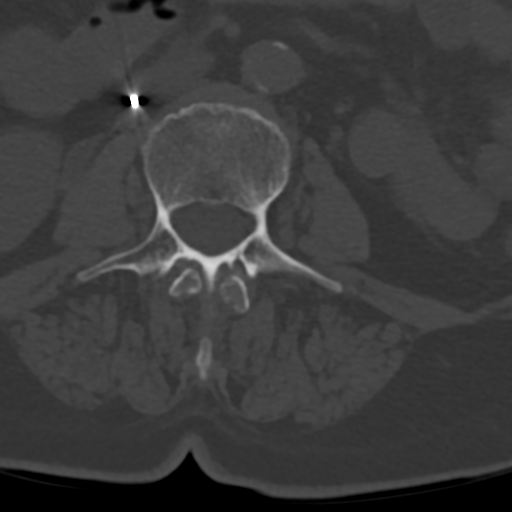
[im 81/121  bone]
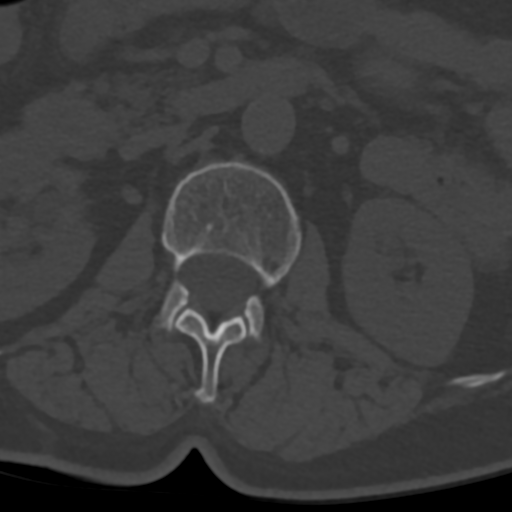
[im 101/121  soft-tissue]
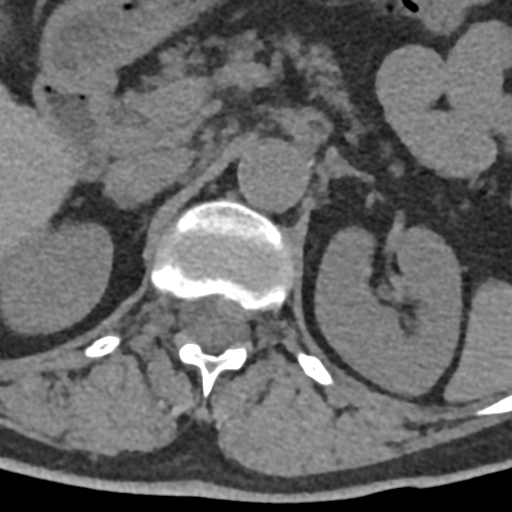
[im 101/121  bone]
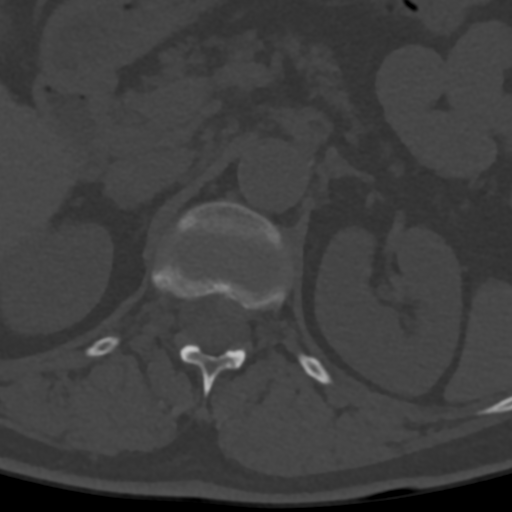

[Series 5: l-spine 2.00 br60 s3 sag sag bone · sagittal · 0.34mm/px · 5 of 87 slices shown, 6 images]
[im 29/87  bone]
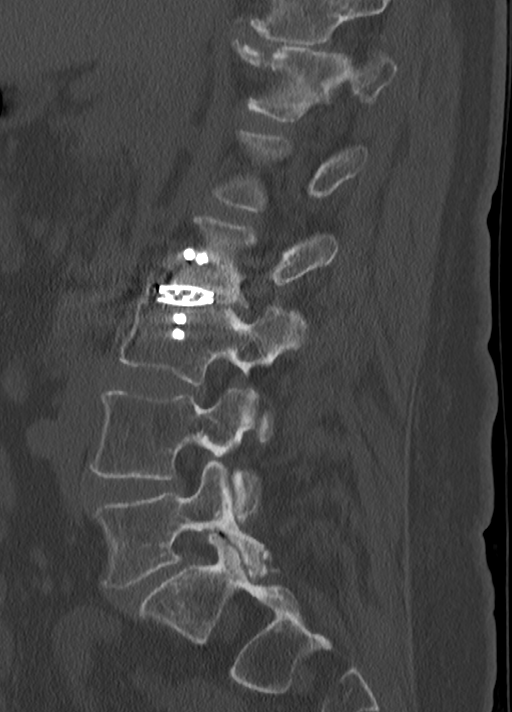
[im 36/87  bone]
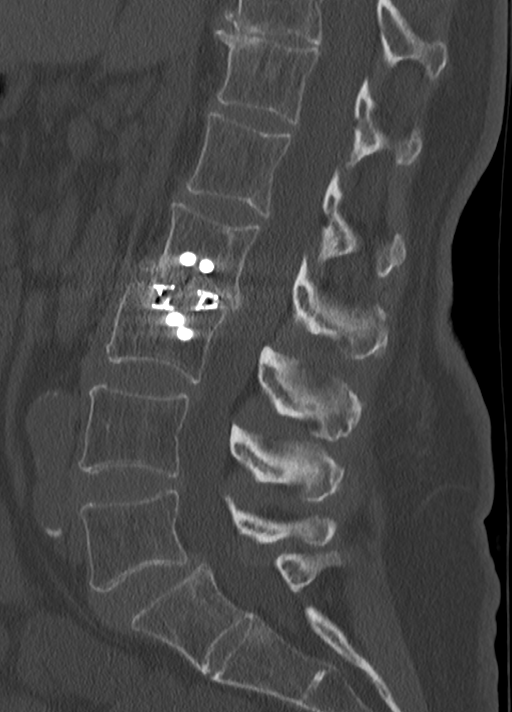
[im 44/87  soft-tissue]
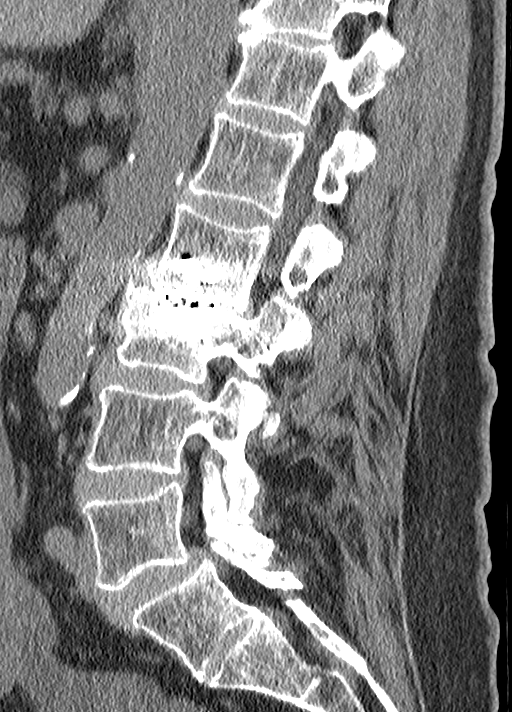
[im 44/87  bone]
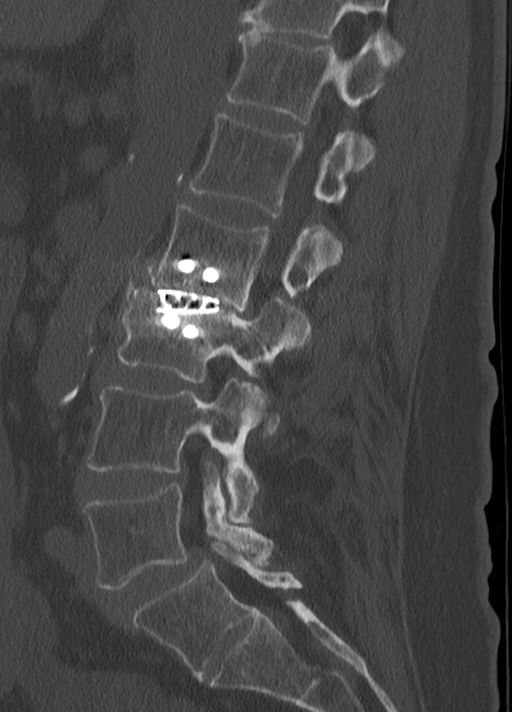
[im 51/87  bone]
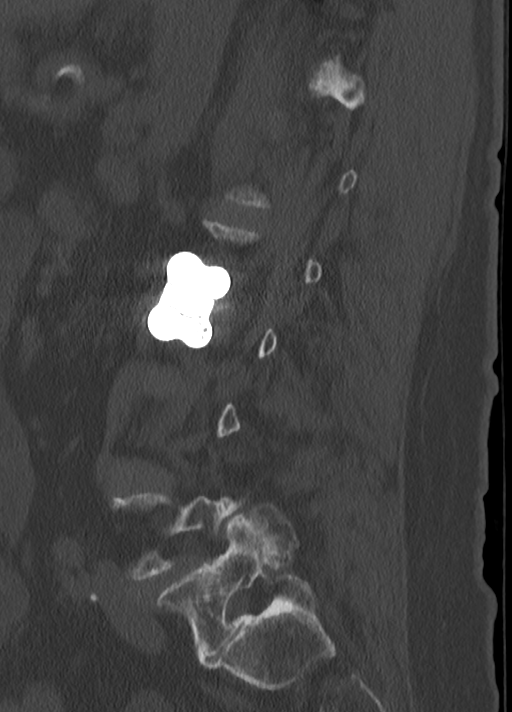
[im 58/87  bone]
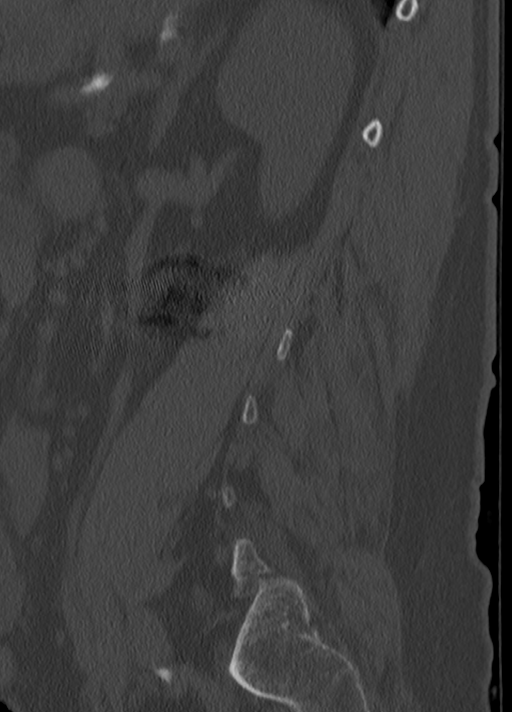

[Series 7: l-spine 2.00 br60 s3 cor cor bone · coronal · 0.34mm/px · 3 of 87 slices shown]
[im 18/87  bone]
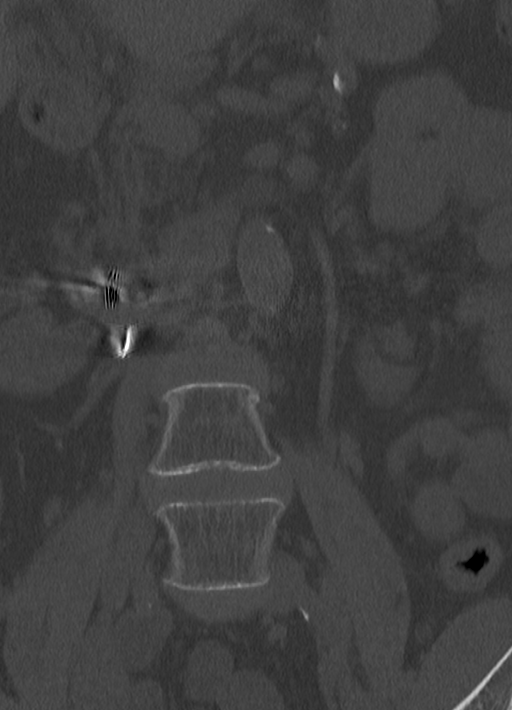
[im 35/87  bone]
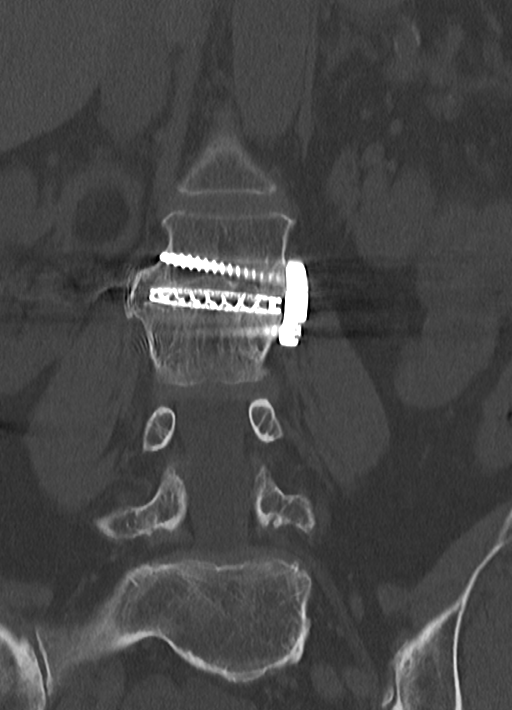
[im 52/87  bone]
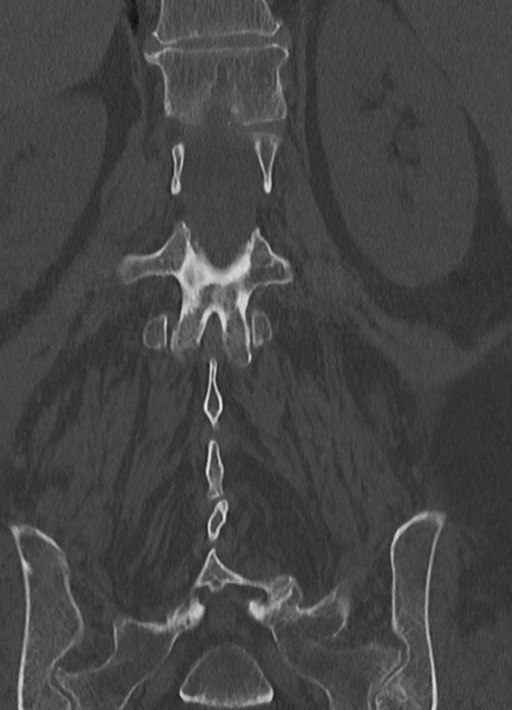

[13 of 33 positions shown; findings below may reference images not displayed]

FINDINGS: Segmentation: Transitional S1 vertebra.

Alignment: Grade 1 anterolisthesis at L5-S1. Mild fused
retrolisthesis at L2-3.

Vertebrae: L2-3 Xlif with solid arthrodesis and no evident hardware
failure.

No acute fracture, erosion, or bone lesion.

Paraspinal and other soft tissues: No acute finding. Atherosclerotic
calcification.

Disc levels:

T11-12: Chronic advanced degenerative disc narrowing.

T12- L1: Large left paracentral extrusion, chronic based on prior.
No evident impingement

L1-L2: Unremarkable.

L2-L3: Xlif with solid arthrodesis. There is moderate subsidence of
the intervertebral cage but the foramina remain patent. Mild
retrolisthesis and posterior ridging with patent canal.

L3-L4: Disc bulging and mild ligamentum flavum thickening. No
evident impingement

L4-L5: Mild disc bulging.

L5-S1:Bulky degenerative facet spurring with anterolisthesis. The
disc is narrowed and bulging. Bilateral subarticular recess
narrowing without static compression based on prior MRI
IMPRESSION: 1. L2-3 Xlif with solid arthrodesis.
2. L5-S1 advanced facet arthropathy with anterolisthesis.
3. T12-L1 chronic noncompressive disc extrusion.
4. No new finding when compared to October 2017 MRI.

## 2020-09-22 DIAGNOSIS — B349 Viral infection, unspecified: Secondary | ICD-10-CM | POA: Diagnosis not present

## 2020-09-22 DIAGNOSIS — J020 Streptococcal pharyngitis: Secondary | ICD-10-CM | POA: Diagnosis not present

## 2020-10-12 DIAGNOSIS — M1990 Unspecified osteoarthritis, unspecified site: Secondary | ICD-10-CM | POA: Diagnosis not present

## 2020-10-12 DIAGNOSIS — I10 Essential (primary) hypertension: Secondary | ICD-10-CM | POA: Diagnosis not present

## 2020-10-12 DIAGNOSIS — Z6835 Body mass index (BMI) 35.0-35.9, adult: Secondary | ICD-10-CM | POA: Diagnosis not present

## 2020-10-12 DIAGNOSIS — E785 Hyperlipidemia, unspecified: Secondary | ICD-10-CM | POA: Diagnosis not present

## 2020-10-12 DIAGNOSIS — E118 Type 2 diabetes mellitus with unspecified complications: Secondary | ICD-10-CM | POA: Diagnosis not present

## 2020-10-12 DIAGNOSIS — E063 Autoimmune thyroiditis: Secondary | ICD-10-CM | POA: Diagnosis not present

## 2020-10-12 DIAGNOSIS — E559 Vitamin D deficiency, unspecified: Secondary | ICD-10-CM | POA: Diagnosis not present

## 2020-10-15 DIAGNOSIS — E785 Hyperlipidemia, unspecified: Secondary | ICD-10-CM | POA: Diagnosis not present

## 2020-10-15 DIAGNOSIS — E063 Autoimmune thyroiditis: Secondary | ICD-10-CM | POA: Diagnosis not present

## 2020-10-15 DIAGNOSIS — E118 Type 2 diabetes mellitus with unspecified complications: Secondary | ICD-10-CM | POA: Diagnosis not present

## 2020-10-26 DIAGNOSIS — M542 Cervicalgia: Secondary | ICD-10-CM | POA: Diagnosis not present

## 2020-11-04 DIAGNOSIS — M542 Cervicalgia: Secondary | ICD-10-CM | POA: Diagnosis not present

## 2020-11-09 DIAGNOSIS — M542 Cervicalgia: Secondary | ICD-10-CM | POA: Diagnosis not present

## 2020-11-11 DIAGNOSIS — M542 Cervicalgia: Secondary | ICD-10-CM | POA: Diagnosis not present

## 2020-11-17 ENCOUNTER — Telehealth: Payer: Self-pay | Admitting: *Deleted

## 2020-11-17 NOTE — Telephone Encounter (Signed)
   [facility] Pre-operative Risk Assessment    HEARTCARE STAFF: - Please ensure there is not already an duplicate clearance open for this procedure. - Under Visit Info/Reason for Call, type in Other and utilize the format Clearance MM/DD/YY or Clearance TBD. Do not use dashes or single digits. - If request is for dental extraction, please clarify the # of teeth to be extracted.  Request for surgical clearance:  1. What type of surgery is being performed? C3-4 ANTERIOR CERVICAL FUSION W/REMOVAL OF HARDWARE   2. When is this surgery scheduled? TBD   3. What type of clearance is required (medical clearance vs. Pharmacy clearance to hold med vs. Both)? MEDICAL  4. Are there any medications that need to be held prior to surgery and how long? NONE LISTED    5. Practice name and name of physician performing surgery? [facility]; DR. GARY CRAM   6. What is the office phone number? [phone]   7.   What is the office fax number? [place]: VANESSA  8.   Anesthesia type (None, local, MAC, general) ? GENERAL   Julaine Hua 11/17/2020, 9:23 AM  _________________________________________________________________   (provider comments below)

## 2020-11-17 NOTE — Telephone Encounter (Signed)
Spoke with Lorriane Shire from [facility] to advise Patient Scheduled for Pre-op clearance on December 06, 2020

## 2020-11-17 NOTE — Telephone Encounter (Signed)
   Primary Cardiologist: Fransico Him, MD  Chart reviewed as part of pre-operative protocol coverage. Patient is scheduled for a preop appointment with Dr. Radford Pax 12/07/20. "Preop" is added to the appointment notes so the provider is aware.   Pre-op covering staff: - Please contact requesting surgeon's office via preferred method (i.e, phone, fax) to inform them of need for appointment prior to surgery.   Abigail Butts, PA-C  11/17/2020, 2:31 PM

## 2020-12-07 ENCOUNTER — Ambulatory Visit (HOSPITAL_COMMUNITY): Payer: Medicare Other | Attending: Cardiology

## 2020-12-07 ENCOUNTER — Other Ambulatory Visit: Payer: Self-pay

## 2020-12-07 ENCOUNTER — Encounter: Payer: Self-pay | Admitting: Cardiology

## 2020-12-07 ENCOUNTER — Ambulatory Visit (INDEPENDENT_AMBULATORY_CARE_PROVIDER_SITE_OTHER): Payer: Medicare Other | Admitting: Cardiology

## 2020-12-07 VITALS — BP 144/96 | HR 79 | Ht 63.0 in | Wt 201.6 lb

## 2020-12-07 DIAGNOSIS — I350 Nonrheumatic aortic (valve) stenosis: Secondary | ICD-10-CM | POA: Diagnosis present

## 2020-12-07 DIAGNOSIS — E785 Hyperlipidemia, unspecified: Secondary | ICD-10-CM

## 2020-12-07 DIAGNOSIS — R0989 Other specified symptoms and signs involving the circulatory and respiratory systems: Secondary | ICD-10-CM

## 2020-12-07 DIAGNOSIS — I77819 Aortic ectasia, unspecified site: Secondary | ICD-10-CM | POA: Diagnosis not present

## 2020-12-07 DIAGNOSIS — I2510 Atherosclerotic heart disease of native coronary artery without angina pectoris: Secondary | ICD-10-CM

## 2020-12-07 DIAGNOSIS — Z01818 Encounter for other preprocedural examination: Secondary | ICD-10-CM

## 2020-12-07 DIAGNOSIS — I351 Nonrheumatic aortic (valve) insufficiency: Secondary | ICD-10-CM | POA: Insufficient documentation

## 2020-12-07 DIAGNOSIS — I10 Essential (primary) hypertension: Secondary | ICD-10-CM

## 2020-12-07 LAB — ECHOCARDIOGRAM COMPLETE
AR max vel: 1.98 cm2
AV Area VTI: 1.83 cm2
AV Area mean vel: 1.9 cm2
AV Mean grad: 12.5 mmHg
AV Peak grad: 24.2 mmHg
Ao pk vel: 2.46 m/s
Area-P 1/2: 2.35 cm2
Height: 63 in
P 1/2 time: 472 msec
S' Lateral: 2 cm
Weight: 3225.6 oz

## 2020-12-07 NOTE — Patient Instructions (Addendum)
Medication Instructions:  Your physician recommends that you continue on your current medications as directed. Please refer to the Current Medication list given to you today.  *If you need a refill on your cardiac medications before your next appointment, please call your pharmacy*  Testing/Procedures: Your physician has requested that you have an echocardiogram. Echocardiography is a painless test that uses sound waves to create images of your heart. It provides your doctor with information about the size and shape of your heart and how well your heart's chambers and valves are working. This procedure takes approximately one hour. There are no restrictions for this procedure.  Your physician has requested that you have a carotid duplex. This test is an ultrasound of the carotid arteries in your neck. It looks at blood flow through these arteries that supply the brain with blood. Allow one hour for this exam. There are no restrictions or special instructions.  Follow-Up: At [facility], you and your health needs are our priority.  As part of our continuing mission to provide you with exceptional heart care, we have created designated Provider Care Teams.  These Care Teams include your primary Cardiologist (physician) and Advanced Practice Providers (APPs -  Physician Assistants and Nurse Practitioners) who all work together to provide you with the care you need, when you need it.  Your next appointment:   1 year(s)  The format for your next appointment:   In Person  Provider:   You may see Fransico Him, MD or one of the following Advanced Practice Providers on your designated Care Team:    Melina Copa, PA-C  Ermalinda Barrios, PA-C

## 2020-12-07 NOTE — Progress Notes (Signed)
Cardiology Office Note    Date:  12/07/2020   ID:  Erica Calhoun, DOB 1950/12/13, MRN [id number]  PCP:  Penelope Coop, FNP  Cardiologist:  Fransico Him, MD   Chief Complaint  Patient presents with  . Coronary Artery Disease  . Aortic Stenosis  . Hypertension  . Hyperlipidemia    History of Present Illness:  Erica Calhoun is a 70 y.o. female with a history of a ASCAD with cardiac catheterization March 2018 showing a 10% mid LAD, distal LAD and mid left circumflex with normal LV function with EF 60% and very mild aortic stenosis.  She has a history of hyperlipidemia, fibromyalgia, hypertension.    She is here today for followup and is doing well.  She denies any chest pain or pressure, SOB, DOE, PND, orthopnea, LE edema, dizziness, palpitations or syncope. She is compliant with her meds and is tolerating meds with no SE.    Past Medical History:  Diagnosis Date  . Aortic stenosis   . Arthritis    cervical and lower back   . Chest pain    s/p LHC 11/28/16: 10% mLAd, dLAD, mCx, EF 60%, very mild AS (peak grad 16) - cardiologist Dr. Wynonia Lawman  . Dyslipidemia   . Fibromyalgia   . Heart murmur    mild AS 11/2016 cath  . History of recurrent UTIs   . Hyperlipidemia   . Hypertension   . Hypothyroidism   . Leaky heart valve    recent visit with cardiologist, okay'd for surgery  . Osteopenia   . Pneumonia    hx/1/17  . Ureter obstruction    right side, back in 1980's    No problems since    Past Surgical History:  Procedure Laterality Date  . ANTERIOR CERVICAL DECOMP/DISCECTOMY FUSION N/A 10/02/2016   Procedure: Anterior Cervical Diskectomy and Fusion - Cervical four- Cervical five - Cervical five-Cervical six - Cervical six -Cervical seven;  Surgeon: Kary Kos, MD;  Location: [facility];  Service: Neurosurgery;  Laterality: N/A;  . ANTERIOR LAT LUMBAR FUSION Left 11/05/2017   Procedure: LUMBAR TWO-THREE ANTERIOR LATERAL LUMBAR FUSION WITH LATERAL PLATE;  Surgeon: Kary Kos, MD;   Location: [facility];  Service: Neurosurgery;  Laterality: Left;  . APPENDECTOMY  1978  . BUNIONECTOMY Bilateral   . CARDIAC CATHETERIZATION     2018 with Dr. Wynonia Lawman  . cyst on ovary     left ovary..  . CYSTOSCOPY N/A 01/23/2018   Procedure: CYSTOSCOPY;  Surgeon: Paula Compton, MD;  Location: [facility];  Service: Gynecology;  Laterality: N/A;  . JOINT REPLACEMENT     bilateral total knees Dr. Alvan Dame 06-20-18  . LAPAROSCOPIC VAGINAL HYSTERECTOMY WITH SALPINGO OOPHORECTOMY Bilateral 01/23/2018   Procedure: LAPAROSCOPIC ASSISTED VAGINAL HYSTERECTOMY WITH SALPINGO OOPHORECTOMY;  Surgeon: Paula Compton, MD;  Location: [facility];  Service: Gynecology;  Laterality: Bilateral;  . LEFT HEART CATH AND CORONARY ANGIOGRAPHY N/A 11/28/2016   Procedure: Left Heart Cath and Coronary Angiography;  Surgeon: Troy Sine, MD;  Location: [facility];  Service: Cardiovascular;  Laterality: N/A;  . TOTAL KNEE ARTHROPLASTY Bilateral 06/20/2018   Procedure: TOTAL KNEE BILATERAL;  Surgeon: Paralee Cancel, MD;  Location: [facility];  Service: Orthopedics;  Laterality: Bilateral;  2.5 hrs  . TUBAL LIGATION    . wisdon teeth      Current Medications: Current Meds  Medication Sig  . Ascorbic Acid (VITAMIN C) 1000 MG tablet Take 1,000 mg by mouth 2 (two) times daily.  Marland Kitchen  carboxymethylcellulose (REFRESH PLUS) 0.5 % SOLN Apply 2 drops to eye at bedtime.  Marland Kitchen ezetimibe (ZETIA) 10 MG tablet Take 10 mg by mouth daily.  . finasteride (PROSCAR) 5 MG tablet Take 5 mg by mouth daily.  . fluticasone (FLONASE) 50 MCG/ACT nasal spray Place 1 spray into both nostrils daily as needed for allergies or rhinitis.  . Glucosamine-Chondroitin (GLUCOSAMINE CHONDR COMPLEX PO) Take by mouth.  . hydrochlorothiazide (MICROZIDE) 12.5 MG capsule Take 12.5 mg by mouth daily.  Marland Kitchen ketoconazole (NIZORAL) 2 % shampoo Apply topically 3 (three) times a week.  . levothyroxine (SYNTHROID) 100 MCG tablet Take 100 mcg by mouth every other day.  .  levothyroxine (SYNTHROID, LEVOTHROID) 88 MCG tablet Take 88 mcg by mouth every other day.  . losartan (COZAAR) 100 MG tablet Take 100 mg by mouth daily.  . metoprolol succinate (TOPROL-XL) 25 MG 24 hr tablet Take 25 mg by mouth daily.  . polyethylene glycol (MIRALAX / GLYCOLAX) packet Take 17 g by mouth 2 (two) times daily.  . traMADol (ULTRAM) 50 MG tablet tramadol 50 mg tablet  . triamcinolone cream (KENALOG) 0.1 % Apply 1 application topically daily as needed (rash).   . UNABLE TO FIND in the morning and at bedtime. Nutrafol  . Vitamin D, Ergocalciferol, (DRISDOL) 50000 units CAPS capsule Take 50,000 Units by mouth every Friday.  . Wheat Dextrin (BENEFIBER) POWD Take 1 Scoop by mouth daily. Mixed with miralax    Allergies:   Penicillins, Adhesive [tape], Aspirin, Codeine, and Morphine   Social History   Socioeconomic History  . Marital status: Married    Spouse name: Not on file  . Number of children: Not on file  . Years of education: Not on file  . Highest education level: Not on file  Occupational History  . Not on file  Tobacco Use  . Smoking status: Never Smoker  . Smokeless tobacco: Never Used  Vaping Use  . Vaping Use: Never used  Substance and Sexual Activity  . Alcohol use: No    Comment: quit 78's age  . Drug use: No  . Sexual activity: Yes  Other Topics Concern  . Not on file  Social History Narrative  . Not on file   Social Determinants of Health   Financial Resource Strain: Not on file  Food Insecurity: Not on file  Transportation Needs: Not on file  Physical Activity: Not on file  Stress: Not on file  Social Connections: Not on file     Family History:  The patient's family history includes Bladder Cancer in her brother and father; Colon cancer in her maternal grandmother; Goiter in her mother.   ROS:   Please see the history of present illness.    ROS All other systems reviewed and are negative.  No flowsheet data found.   PHYSICAL EXAM:    VS:  BP (!) 144/96   Pulse 79   Ht 5\' 3"  (1.6 m)   Wt 201 lb 9.6 oz (91.4 kg)   SpO2 97%   BMI 35.71 kg/m    GEN: Well nourished, well developed in no acute distress HEENT: Normal NECK: No JVD;bilateral carotid bruits radiating from RUSB of heart LYMPHATICS: No lymphadenopathy CARDIAC:RRR, no  rubs, gallops.  2/6 SM at RUSB  RESPIRATORY:  Clear to auscultation without rales, wheezing or rhonchi  ABDOMEN: Soft, non-tender, non-distended MUSCULOSKELETAL:  No edema; No deformity  SKIN: Warm and dry NEUROLOGIC:  Alert and oriented x 3 PSYCHIATRIC:  Normal affect  Wt Readings from Last 3 Encounters:  12/07/20 201 lb 9.6 oz (91.4 kg)  04/06/20 218 lb (98.9 kg)  02/09/20 218 lb 6 oz (99.1 kg)      Studies/Labs Reviewed:   EKG:  EKG is ordered today.  The ekg ordered today demonstrates NSR with rSR' in V1  2D echo 2019 Study Conclusions   - Left ventricle: The cavity size was normal. There was mild focal  basal hypertrophy of the septum. Systolic function was normal.  The estimated ejection fraction was in the range of 60% to 65%.  Wall motion was normal; there were no regional wall motion  abnormalities. Doppler parameters are consistent with abnormal  left ventricular relaxation (grade 1 diastolic dysfunction).  Doppler parameters are consistent with high ventricular filling  pressure.  - Aortic valve: Valve mobility was restricted. There was moderate  stenosis. There was mild regurgitation. Peak velocity (S): 306  cm/s. Mean gradient (S): 23 mm Hg.  - Aorta: Ascending aortic diameter: 41 mm (S).  - Ascending aorta: The ascending aorta was mildly dilated.  - Mitral valve: Calcified annulus. There was mild regurgitation.  - Right ventricle: The cavity size was normal. Wall thickness was  normal.  - Tricuspid valve: There was mild regurgitation.  - Pulmonic valve: There was trivial regurgitation.  Recent Labs: No results found for requested labs  within last 8760 hours.   Lipid Panel No results found for: CHOL, TRIG, HDL, CHOLHDL, VLDL, LDLCALC, LDLDIRECT  Additional studies/ records that were reviewed today include:  Office notes from Ortho office   ASSESSMENT:    1. Nonobstructive atherosclerosis of coronary artery   2. Nonrheumatic aortic valve stenosis   3. Benign essential HTN   4. Hyperlipidemia LDL goal <70   5. Acquired dilation of ascending aorta and aortic root (HCC)   6. Bilateral carotid bruits   7. Preoperative clearance      PLAN:  In order of problems listed above:  Nonobstructive CAD  -cardiac cath in 2018 showed minimal nonobstructive CAD with 10% mid LAD, RCA and left circumflex.  LV function was normal. -she has not had any anginal sx  Aortic stenosis -minimal aortic stenosis noted the time of cath in 2018 with peak aortic valve gradient 16 mmHg.  -repeat echo 2019 with moderate AS with mean AVG 42mmHg -repeat echo to make sure this has not progressed  HTN  -BP controlled on exam today -continue on losartan-HCTZ 100-12.5 mg daily andToprol-XL 25 mg daily. -SCr stable at 0.540 and K+ 3.9 in Feb 2022  Hyperlipidemia  -LDL goal at least < 100 and preferably < 70 -LDL was 158 in Feb 2021 -She has not been on a statin so will add Lipitor 20mg  daily and repeat FLP and ALT in 6 weeks  Dilated ascending aorta -35mm by echo 2019 -will reassess on repeat echo -BP is well controlled  Carotid artery bruits -this is likely related to radiation of her AS murmur but she has never had dopplers done -get carotid dopplers  Preoperative Clearance -she is stable from a cardiac standpoint -she has a 0.4% periop risk of major cardiac event based on the RCR index -She does have aortic stenosis that was moderate in 2019 so this needs to be repeated prior to cardiac clearance -She can achieve 5.62 mets of activity with no symptoms and no further ischemic workup is needed at this time    Medication  Adjustments/Labs and Tests Ordered: Current medicines are reviewed at length with the patient today.  Concerns regarding medicines are outlined above.  Medication changes, Labs and Tests ordered today are listed in the Patient Instructions below.  There are no Patient Instructions on file for this visit.   Signed, Fransico Him, MD  12/07/2020 11:45 AM    [address], [place], [place]  [address] Phone: [phone]; Fax: [phone]

## 2020-12-07 NOTE — Addendum Note (Signed)
Addended by: Antonieta Iba on: 12/07/2020 11:57 AM   Modules accepted: Orders

## 2020-12-08 ENCOUNTER — Telehealth: Payer: Self-pay

## 2020-12-08 ENCOUNTER — Ambulatory Visit (HOSPITAL_COMMUNITY)
Admission: RE | Admit: 2020-12-08 | Discharge: 2020-12-08 | Disposition: A | Discharge status: Home / Self Care | Payer: Medicare Other | Source: Ambulatory Visit | Attending: Cardiology | Admitting: Cardiology

## 2020-12-08 DIAGNOSIS — I350 Nonrheumatic aortic (valve) stenosis: Secondary | ICD-10-CM | POA: Insufficient documentation

## 2020-12-08 DIAGNOSIS — I712 Thoracic aortic aneurysm, without rupture, unspecified: Secondary | ICD-10-CM

## 2020-12-08 DIAGNOSIS — R0989 Other specified symptoms and signs involving the circulatory and respiratory systems: Secondary | ICD-10-CM | POA: Diagnosis not present

## 2020-12-08 NOTE — Telephone Encounter (Signed)
-----   Message from Sueanne Margarita, MD sent at 12/07/2020  5:53 PM EDT ----- Echo showed normal LVF with increased stiffness of heart muscle, mild PHTN, mild AS and mild AR, mildly dilated ascending aorta at 77mm - repeat echo in 1 year for dilated aorta and AS.  Please get gated Chest CTA to assess thoracic aorta due to aortic dilatation

## 2020-12-08 NOTE — Telephone Encounter (Signed)
No it can wait until after surgery

## 2020-12-08 NOTE — Telephone Encounter (Signed)
The patient has been notified of the result and verbalized understanding.  All questions (if any) were answered. Antonieta Iba, RN 12/08/2020 11:43 AM  Chest CTA has been ordered.

## 2020-12-08 NOTE — Telephone Encounter (Signed)
Spoke with the patient and advised her that the CT scan could wait until after her surgery. Awaiting results from carotid ultrasound scheduled for today for clearance.

## 2020-12-09 ENCOUNTER — Telehealth: Payer: Self-pay

## 2020-12-09 ENCOUNTER — Encounter: Payer: Self-pay | Admitting: Cardiology

## 2020-12-09 DIAGNOSIS — R0989 Other specified symptoms and signs involving the circulatory and respiratory systems: Secondary | ICD-10-CM

## 2020-12-09 DIAGNOSIS — I2510 Atherosclerotic heart disease of native coronary artery without angina pectoris: Secondary | ICD-10-CM

## 2020-12-09 DIAGNOSIS — I6529 Occlusion and stenosis of unspecified carotid artery: Secondary | ICD-10-CM | POA: Insufficient documentation

## 2020-12-09 DIAGNOSIS — E785 Hyperlipidemia, unspecified: Secondary | ICD-10-CM

## 2020-12-09 DIAGNOSIS — Z79899 Other long term (current) drug therapy: Secondary | ICD-10-CM

## 2020-12-09 NOTE — Telephone Encounter (Signed)
-----   Message from Sueanne Margarita, MD sent at 12/09/2020 10:08 AM EDT ----- 1-39% bilateral carotid artery stenosis.  Please have her come in for FLP and ALT

## 2020-12-09 NOTE — Telephone Encounter (Signed)
Pt verbalized understanding of her Carotid results.. she is willing to come in for Fasting Lipids next week but she reports that she is not willing to take a statin but I advised her hat there are other options and this will help Dr. Radford Pax decide what is best for her and she agrees.   Pt is asking if she can get her clearance sent to her surgeon. I advised her that I will forward to Dr. Radford Pax and we will take care of as soon as possible.

## 2020-12-09 NOTE — Telephone Encounter (Signed)
2D echo showed normal LVF with mild AS and carotid dopplers with mild stenosis.  Please refer to my OV note - she is low risk for surgery

## 2020-12-10 NOTE — Telephone Encounter (Signed)
Pt advised she has been cleared for her surgery and I sent her note to the preop pool for completion.

## 2020-12-10 NOTE — Telephone Encounter (Signed)
See separate note for cardiac clearance.  I have forwarded the cardiac clearance letter back to the surgeon's office today.

## 2020-12-10 NOTE — Telephone Encounter (Signed)
   Patient Name: Erica Calhoun  DOB: [birth year]  MRN: [id number]   Primary Cardiologist: Fransico Him, MD  Chart reviewed as part of pre-operative protocol coverage. Given past medical history and time since last visit, based on ACC/AHA guidelines, ALIX LAHMANN would be at acceptable risk for the planned procedure without further cardiovascular testing.  As part of preoperative clearance, patient underwent both echocardiogram and carotid Doppler.  Echocardiogram showed normal ejection fraction with mild aortic stenosis.  Carotid Doppler also revealed mild stenosis.  Both study were reviewed by Dr. Radford Pax who felt patient is low risk for the intended surgery.  The patient was advised that if she develops new symptoms prior to surgery to contact our office to arrange for a follow-up visit, and she verbalized understanding.  I will route this recommendation to the requesting party via Epic fax function and remove from pre-op pool.  Please call with questions.  Almyra Deforest, [place] 12/10/2020, 8:36 AM

## 2020-12-13 ENCOUNTER — Other Ambulatory Visit: Payer: Self-pay | Admitting: Neurosurgery

## 2020-12-15 ENCOUNTER — Other Ambulatory Visit: Payer: Self-pay

## 2020-12-15 ENCOUNTER — Other Ambulatory Visit: Payer: Medicare Other | Admitting: *Deleted

## 2020-12-15 DIAGNOSIS — R0989 Other specified symptoms and signs involving the circulatory and respiratory systems: Secondary | ICD-10-CM | POA: Diagnosis not present

## 2020-12-15 DIAGNOSIS — E785 Hyperlipidemia, unspecified: Secondary | ICD-10-CM

## 2020-12-15 DIAGNOSIS — I2510 Atherosclerotic heart disease of native coronary artery without angina pectoris: Secondary | ICD-10-CM | POA: Diagnosis not present

## 2020-12-15 DIAGNOSIS — Z79899 Other long term (current) drug therapy: Secondary | ICD-10-CM | POA: Diagnosis not present

## 2020-12-15 LAB — LIPID PANEL
Chol/HDL Ratio: 4.7 ratio — ABNORMAL HIGH (ref 0.0–4.4)
Cholesterol, Total: 242 mg/dL — ABNORMAL HIGH (ref 100–199)
HDL: 51 mg/dL (ref >39)
LDL Chol Calc (NIH): 169 mg/dL — ABNORMAL HIGH (ref 0–99)
Triglycerides: 121 mg/dL (ref 0–149)
VLDL Cholesterol Cal: 22 mg/dL (ref 5–40)

## 2020-12-15 LAB — ALT: ALT: 21 IU/L (ref 0–32)

## 2020-12-22 ENCOUNTER — Ambulatory Visit
Admission: RE | Admit: 2020-12-22 | Discharge: 2020-12-22 | Disposition: A | Discharge status: Home / Self Care | Payer: Medicare Other | Source: Ambulatory Visit | Attending: Cardiology | Admitting: Cardiology

## 2020-12-22 ENCOUNTER — Telehealth: Payer: Self-pay

## 2020-12-22 DIAGNOSIS — I712 Thoracic aortic aneurysm, without rupture, unspecified: Secondary | ICD-10-CM

## 2020-12-22 DIAGNOSIS — I711 Thoracic aortic aneurysm, ruptured: Secondary | ICD-10-CM | POA: Diagnosis not present

## 2020-12-22 MED ORDER — IOPAMIDOL (ISOVUE-370) INJECTION 76%
75.0000 mL | Freq: Once | INTRAVENOUS | Status: AC | PRN
  Administered 2020-12-22: 75 mL via INTRAVENOUS

## 2020-12-22 NOTE — Telephone Encounter (Signed)
-----   Message from Sueanne Margarita, MD sent at 12/18/2020  5:49 PM EDT ----- Please call patient and tell her that I would like her to have an OV with PHarmD for options to treat her lipids ----- Message ----- From: Willeen Cass, RN Sent: 12/16/2020   8:41 AM EDT To: Sueanne Margarita, MD  Placed a call to patient about elevated LDL and informed patient the physician would like her to start on Lipitor 20mg  daily.  Patient does not want to take a statin, she has taken Lipitor in the past and it raised her sugars.  Advised patient she might be a candidate for PCSK9 and asked if doctor wanted her to see the pharmacist, would she be available.  Patient stated, "too busy right now and would rather do it over the phone".  Patient informed this information would be sent to the physician.

## 2020-12-22 NOTE — Telephone Encounter (Signed)
Attempted to call patient. Unable to leave voicemail.  

## 2020-12-24 ENCOUNTER — Encounter: Payer: Self-pay | Admitting: Cardiology

## 2020-12-24 DIAGNOSIS — I700 Atherosclerosis of aorta: Secondary | ICD-10-CM | POA: Insufficient documentation

## 2020-12-27 NOTE — Pre-Procedure Instructions (Addendum)
Surgical Instructions:    Your procedure is scheduled on Friday 12/31/20 (12:00 PM-2:06 PM).  Report to [facility] Main Entrance "A" at 10:00 A.M., then check in with the Admitting office.  Call this number if you have any questions prior to, or have any problems the morning of surgery:  [phone]    Remember:  Do not eat or drink after midnight the night before your surgery.     Take these medicines the morning of surgery with A SIP OF WATER: ezetimibe (ZETIA) finasteride (PROSCAR) levothyroxine (SYNTHROID) metoprolol succinate (TOPROL-XL)   IF NEEDED: traMADol (ULTRAM) fluticasone (FLONASE) nasal spray carboxymethylcellulose (REFRESH PLUS) eye drops   As of today, STOP taking any Aspirin (unless otherwise instructed by your surgeon) Aleve, Naproxen, Ibuprofen, Motrin, Advil, Goody's, BC's, all herbal medications, fish oil, and all vitamins.            Special instructions:   [facility]- Preparing For Surgery  Before surgery, you can play an important role. Because skin is not sterile, your skin needs to be as free of germs as possible. You can reduce the number of germs on your skin by washing with CHG (chlorahexidine gluconate) Soap before surgery.  CHG is an antiseptic cleaner which kills germs and bonds with the skin to continue killing germs even after washing.    Oral Hygiene is also important to reduce your risk of infection.  Remember - BRUSH YOUR TEETH THE MORNING OF SURGERY WITH YOUR REGULAR TOOTHPASTE  Please do not use if you have an allergy to CHG or antibacterial soaps. If your skin becomes reddened/irritated stop using the CHG.  Do not shave (including legs and underarms) for at least 48 hours prior to first CHG shower. It is OK to shave your face.  Please follow these instructions carefully.   1. Shower the NIGHT BEFORE SURGERY and the MORNING OF SURGERY  2. If you chose to wash your hair, wash your hair first as usual with your normal  shampoo.  3. After you shampoo, rinse your hair and body thoroughly to remove the shampoo.  4. Wash Face and genitals (private parts) with your normal soap.   5. Use CHG Soap as you would any other liquid soap. You can apply CHG directly to the skin and wash gently with a scrungie or a clean washcloth.   6. Apply the CHG Soap to your body ONLY FROM THE NECK DOWN.  Do not use on open wounds or open sores. Avoid contact with your eyes, ears, mouth and genitals (private parts). Wash Face and genitals (private parts)  with your normal soap.   7. Wash thoroughly, paying special attention to the area where your surgery will be performed.  8. Thoroughly rinse your body with warm water from the neck down.  9. DO NOT shower/wash with your normal soap after using and rinsing off the CHG Soap.  10. Pat yourself dry with a CLEAN TOWEL.  11. Wear CLEAN PAJAMAS to bed the night before surgery.  12. Place CLEAN SHEETS on your bed the night before your surgery.  13. DO NOT SLEEP WITH PETS.   Day of Surgery: SHOWER with CHG soap. Brush your teeth WITH YOUR REGULAR TOOTHPASTE. Wear Clean/Comfortable clothing the morning of surgery. Do not apply any deodorants/lotions.   Do not wear jewelry, make up, or nail polish. Do not shave 48 hours prior to surgery.   Do NOT Smoke (Tobacco/Vaping) or drink Alcohol 24 hours prior to your procedure. Do not bring valuables  to the hospital. [facility] is not responsible for any belongings or valuables.  If you use a CPAP at night, you may bring all equipment for your overnight stay.   Contacts, glasses, or dentures may not be worn into surgery, please bring cases for these belongings.   For patients admitted to the hospital, discharge time will be determined by your treatment team.   Patients discharged the day of surgery will not be allowed to drive home, and someone needs to stay with them for 24 hours.    Please read over the following fact sheets  that you were given.

## 2020-12-28 ENCOUNTER — Encounter (HOSPITAL_COMMUNITY)
Admission: RE | Admit: 2020-12-28 | Discharge: 2020-12-28 | Disposition: A | Discharge status: Home / Self Care | Payer: Medicare Other | Source: Ambulatory Visit | Attending: Neurosurgery | Admitting: Neurosurgery

## 2020-12-28 ENCOUNTER — Telehealth: Payer: Self-pay

## 2020-12-28 ENCOUNTER — Encounter (HOSPITAL_COMMUNITY): Payer: Self-pay

## 2020-12-28 ENCOUNTER — Other Ambulatory Visit: Payer: Self-pay

## 2020-12-28 DIAGNOSIS — I350 Nonrheumatic aortic (valve) stenosis: Secondary | ICD-10-CM

## 2020-12-28 DIAGNOSIS — Z20822 Contact with and (suspected) exposure to covid-19: Secondary | ICD-10-CM | POA: Insufficient documentation

## 2020-12-28 DIAGNOSIS — Z01812 Encounter for preprocedural laboratory examination: Secondary | ICD-10-CM | POA: Insufficient documentation

## 2020-12-28 DIAGNOSIS — I700 Atherosclerosis of aorta: Secondary | ICD-10-CM

## 2020-12-28 DIAGNOSIS — I77819 Aortic ectasia, unspecified site: Secondary | ICD-10-CM

## 2020-12-28 HISTORY — DX: Chronic sinusitis, unspecified: J32.9

## 2020-12-28 HISTORY — DX: Personal history of other diseases of the digestive system: Z87.19

## 2020-12-28 LAB — CBC
HCT: 40.9 % (ref 36.0–46.0)
Hemoglobin: 13.8 g/dL (ref 12.0–15.0)
MCH: 29.6 pg (ref 26.0–34.0)
MCHC: 33.7 g/dL (ref 30.0–36.0)
MCV: 87.8 fL (ref 80.0–100.0)
Platelets: 211 10*3/uL (ref 150–400)
RBC: 4.66 MIL/uL (ref 3.87–5.11)
RDW: 12.2 % (ref 11.5–15.5)
WBC: 7.3 10*3/uL (ref 4.0–10.5)
nRBC: 0 % (ref 0.0–0.2)

## 2020-12-28 LAB — BASIC METABOLIC PANEL
Anion gap: 7 (ref 5–15)
BUN: 12 mg/dL (ref 8–23)
CO2: 29 mmol/L (ref 22–32)
Calcium: 9.6 mg/dL (ref 8.9–10.3)
Chloride: 102 mmol/L (ref 98–111)
Creatinine, Ser: 0.6 mg/dL (ref 0.44–1.00)
GFR, Estimated: >60 mL/min (ref >60)
Glucose, Bld: 113 mg/dL — ABNORMAL HIGH (ref 70–99)
Potassium: 4.2 mmol/L (ref 3.5–5.1)
Sodium: 138 mmol/L (ref 135–145)

## 2020-12-28 LAB — SURGICAL PCR SCREEN
MRSA, PCR: NEGATIVE
Staphylococcus aureus: NEGATIVE

## 2020-12-28 LAB — SARS CORONAVIRUS 2 (TAT 6-24 HRS): SARS Coronavirus 2: NEGATIVE

## 2020-12-28 NOTE — Telephone Encounter (Signed)
-----   Message from Sueanne Margarita, MD sent at 12/24/2020  8:33 PM EDT ----- Stable dilated ascending aorta at 4cm -repeat 2D echo in 1 year

## 2020-12-28 NOTE — Progress Notes (Signed)
Elevated BP at Pre-Surgery Testing: BP elevated as shown. Pt c/o sinus headache as well as neck and back pain, did not take any pain meds prior to arrival of appointment. Pt stated she took her Metoprolol this morning @ 10AM; has not yet taken hydrochlorothiazide (MICROZIDE) or losartan (COZAAR), as she usually takes this in the evening. Per pt, her BP usually averages 120s/ 80s. Karoline Caldwell, PA-C notified. Pt instructed to continue to check BP frequently leading up to DOS; if still elevated, pt to contact her PCP. Pt also instructed to take pain meds prior to arrival DOS.   12/28/20 1255 12/28/20 1311 12/28/20 1349  Vitals  Temp Source Oral  --   --   Pulse Rate 65  --   --   Pulse Rate Source Dinamap  --   --   Resp 20  --   --   BP (!) 175/101  --  (!) 199/87  Oxygen Therapy  O2 Device Room Air  --   --   Pain Assessment  Pain Scale  --  0-10  --   Pain Score  --  9  --   Pain Type  --  Chronic pain  --   Pain Location  --  Neck  --   Pain Orientation  --  Posterior  --   Pain Descriptors / Indicators  --  Constant;Throbbing;Burning  --   Patients Stated Pain Goal  --  5  --

## 2020-12-28 NOTE — Progress Notes (Signed)
PCP - Claudie Revering, FNP Cardiologist - Fransico Him, MD  PPM/ICD - Denies  Chest x-ray - N/A EKG - 12/07/20 Stress Test - 11/16/16 ECHO - 12/07/20 Cardiac Cath - 11/28/16  Sleep Study - Denies  Patient denies being diabetic.  Blood Thinner Instructions: N/A Aspirin Instructions: Per pt last dose 12/19/20.  ERAS Protcol - N/A PRE-SURGERY Ensure or G2- N/A  COVID TEST- 12/28/20; Results pending   Anesthesia review: Yes, cardiac hx; cardiac clearance note 11/17/20.  Patient denies shortness of breath, fever, cough and chest pain at PAT appointment   All instructions explained to the patient, with a verbal understanding of the material. Patient agrees to go over the instructions while at home for a better understanding. Patient also instructed to self quarantine after being tested for COVID-19. The opportunity to ask questions was provided.

## 2020-12-29 NOTE — Anesthesia Preprocedure Evaluation (Addendum)
Anesthesia Evaluation  Patient identified by MRN, date of birth, ID band Patient awake    Reviewed: Allergy & Precautions, NPO status , Patient's Chart, lab work & pertinent test results  Airway Mallampati: II  TM Distance: >3 FB Neck ROM: Full    Dental no notable dental hx.    Pulmonary neg pulmonary ROS,    Pulmonary exam normal breath sounds clear to auscultation       Cardiovascular hypertension, Pt. on medications and Pt. on home beta blockers Normal cardiovascular exam+ Valvular Problems/Murmurs AI  Rhythm:Regular Rate:Normal  Mild AI   Neuro/Psych negative neurological ROS  negative psych ROS   GI/Hepatic negative GI ROS, Neg liver ROS,   Endo/Other  Hypothyroidism   Renal/GU negative Renal ROS  negative genitourinary   Musculoskeletal  (+) Arthritis , Fibromyalgia -  Abdominal   Peds negative pediatric ROS (+)  Hematology negative hematology ROS (+)   Anesthesia Other Findings   Reproductive/Obstetrics negative OB ROS                            Anesthesia Physical Anesthesia Plan  ASA: III  Anesthesia Plan: General   Post-op Pain Management:    Induction: Intravenous  PONV Risk Score and Plan: 3 and Ondansetron, Dexamethasone, Treatment may vary due to age or medical condition and Midazolam  Airway Management Planned: Oral ETT  Additional Equipment:   Intra-op Plan:   Post-operative Plan: Extubation in OR  Informed Consent: I have reviewed the patients History and Physical, chart, labs and discussed the procedure including the risks, benefits and alternatives for the proposed anesthesia with the patient or authorized representative who has indicated his/her understanding and acceptance.     Dental advisory given  Plan Discussed with: CRNA and Surgeon  Anesthesia Plan Comments: (Patient noted to have elevated blood pressure preadmission testing appointment.   175/101 on arrival and 199/87 on recheck.  She was asymptomatic, denied headaches, denied chest pain, denied shortness of breath.  She repored that she monitors her blood pressure with automatic cuff at home and it is generally under much better control.  She stated she was having severe neck pain and had not yet taken any of her pain medications.  She stated that she did take her metoprolol this morning but is not yet taken her HCTZ or losartan as she takes these in the evening.  Patient was instructed to monitor blood pressure closely and follow-up with her PCP/cardiologist if it remains elevated.  She verbalized understanding.  She understands markedly uncontrolled hypertension on day of surgery could be cause for cancellation.  She follows with cardiology for history of nonobstructive CAD, HLD, HTN and mild/moderate AS.  She was last seen by Dr. Radford Pax 12/07/2020 and at that time her blood pressure was 144/96.  Her upcoming surgery was discussed.  She was recommended to have repeat echo prior to surgery due to history of moderate AS.  Per note, "Preoperative Clearance -she is stable from a cardiac standpoint -she has a 0.4% periop risk of major cardiac event based on the RCR index -She does have aortic stenosis that was moderate in 2019 so this needs to be repeated prior to cardiac clearance -She can achieve 5.62 mets of activity with no symptoms and no further ischemic workup is needed at this timethis time."  Patient did have repeat echo 12/07/2020 which showed normal LVEF, mild PHTN, mild AS and mild AR, mildly dilated ascending aorta at 43 mm.  Formal clearance per telephone encounter 12/10/2020, "Chart reviewed as part of pre-operative protocol coverage. Given past medical history and time since last visit, based on ACC/AHA guidelines, KAEDEN DEPAZ would be at acceptable risk for the planned procedure without further cardiovascular testing.  As part of preoperative clearance, patient underwent both  echocardiogram and carotid Doppler.  Echocardiogram showed normal ejection fraction with mild aortic stenosis.  Carotid Doppler also revealed mild stenosis.  Both study were reviewed by Dr. Radford Pax who felt patient is low risk for the intended surgery."  Preop labs reviewed, unremarkable.  EKG 12/07/2020: NSR.  Rate 70.  CTA chest aorta 12/28/2020: IMPRESSION: 1. Ascending thoracic aortic aneurysm measuring 4 cm. Recommend annual imaging followup by CTA or MRA. This recommendation follows 2010 ACCF/AHA/AATS/ACR/ASA/SCA/SCAI/SIR/STS/SVM Guidelines for the Diagnosis and Management of Patients with Thoracic Aortic Disease. Circulation. 2010; 121: O122-Q825. Aortic aneurysm NOS (ICD10-I71.9) 2. No acute intrathoracic process. 3.  Aortic Atherosclerosis (ICD10-I70.0).  TTE 12/07/2020: 1. Mild aortic stenosis and mildly dilated ascending aorta - 43 mm.  2. Left ventricular ejection fraction, by estimation, is 60 to 65%. Left  ventricular ejection fraction by 3D volume is 70 %. The left ventricle has  normal function. The left ventricle has no regional wall motion  abnormalities. Left ventricular diastolic  parameters are consistent with Grade I diastolic dysfunction (impaired  relaxation).  3. Right ventricular systolic function is normal. The right ventricular  size is normal. There is mildly elevated pulmonary artery systolic  pressure. The estimated right ventricular systolic pressure is 00.3 mmHg.  4. The mitral valve is normal in structure. No evidence of mitral valve  regurgitation. No evidence of mitral stenosis.  5. The aortic valve is tricuspid. There is moderate calcification of the  aortic valve. There is moderate thickening of the aortic valve. Aortic  valve regurgitation is mild. Mild aortic valve stenosis. Aortic  regurgitation PHT measures 472 msec. Aortic  valve area, by VTI measures 1.83 cm. Aortic valve mean gradient measures  12.5 mmHg. Aortic valve Vmax measures 2.46  m/s.  6. Aortic dilatation noted. There is mild dilatation of the ascending  aorta, measuring 43 mm.  7. The inferior vena cava is normal in size with greater than 50%  respiratory variability, suggesting right atrial pressure of 3 mmHg.   LHC 11/28/2016:  Mid LAD lesion, 10 %stenosed.  Dist LAD lesion, 10 %stenosed.  Mid Cx lesion, 10 %stenosed.  The left ventricular systolic function is normal.  LV end diastolic pressure is normal.  There is mild aortic valve stenosis.   Normal LV function with an ejection fraction of 60%.  There is evidence for mild LVH.  The aortic valve is not significantly calcified.  There is mild aortic stenosis with a peak to peak gradient of 16 mm.  No significant coronary obstructive disease with a 10% narrowings in the mid LAD, and distal circumflex.  Normal dominant RCA.  RECOMMENDATION: The present study does not demonstrate any significant coronary obstructive disease. There is very mild aortic valve stenosis.  The patient will return to the cardiology care of Dr. Wynonia Lawman.)       Anesthesia Quick Evaluation

## 2020-12-29 NOTE — Progress Notes (Signed)
Anesthesia Chart Review:  Patient noted to have elevated blood pressure preadmission testing appointment.  175/101 on arrival and 199/87 on recheck.  She was asymptomatic, denied headaches, denied chest pain, denied shortness of breath.  She repored that she monitors her blood pressure with automatic cuff at home and it is generally under much better control.  She stated she was having severe neck pain and had not yet taken any of her pain medications.  She stated that she did take her metoprolol this morning but is not yet taken her HCTZ or losartan as she takes these in the evening.  Patient was instructed to monitor blood pressure closely and follow-up with her PCP/cardiologist if it remains elevated.  She verbalized understanding.  She understands markedly uncontrolled hypertension on day of surgery could be cause for cancellation.  She follows with cardiology for history of nonobstructive CAD, HLD, HTN and mild/moderate AS.  She was last seen by Dr. Radford Calhoun 12/07/2020 and at that time her blood pressure was 144/96.  Her upcoming surgery was discussed.  She was recommended to have repeat echo prior to surgery due to history of moderate AS.  Per note, "Preoperative Clearance -she is stable from a cardiac standpoint -she has a 0.4% periop risk of major cardiac event based on the RCR index -She does have aortic stenosis that was moderate in 2019 so this needs to be repeated prior to cardiac clearance -She can achieve 5.62 mets of activity with no symptoms and no further ischemic workup is needed at this timethis time."  Patient did have repeat echo 12/07/2020 which showed normal LVEF, mild PHTN, mild AS and mild AR, mildly dilated ascending aorta at 43 mm.  Formal clearance per telephone encounter 12/10/2020, "Chart reviewed as part of pre-operative protocol coverage. Given past medical history and time since last visit, based on ACC/AHA guidelines, Erica Calhoun would be at acceptable risk for the planned  procedure without further cardiovascular testing.  As part of preoperative clearance, patient underwent both echocardiogram and carotid Doppler.  Echocardiogram showed normal ejection fraction with mild aortic stenosis.  Carotid Doppler also revealed mild stenosis.  Both study were reviewed by Dr. Radford Calhoun who felt patient is low risk for the intended surgery."  Preop labs reviewed, unremarkable.  EKG 12/07/2020: NSR.  Rate 70.  CTA chest aorta 12/28/2020: IMPRESSION: 1. Ascending thoracic aortic aneurysm measuring 4 cm. Recommend annual imaging followup by CTA or MRA. This recommendation follows 2010 ACCF/AHA/AATS/ACR/ASA/SCA/SCAI/SIR/STS/SVM Guidelines for the Diagnosis and Management of Patients with Thoracic Aortic Disease. Circulation. 2010; 121: Y694-W546. Aortic aneurysm NOS (ICD10-I71.9) 2. No acute intrathoracic process. 3.  Aortic Atherosclerosis (ICD10-I70.0).  TTE 12/07/2020: 1. Mild aortic stenosis and mildly dilated ascending aorta - 43 mm.  2. Left ventricular ejection fraction, by estimation, is 60 to 65%. Left  ventricular ejection fraction by 3D volume is 70 %. The left ventricle has  normal function. The left ventricle has no regional wall motion  abnormalities. Left ventricular diastolic  parameters are consistent with Grade I diastolic dysfunction (impaired  relaxation).  3. Right ventricular systolic function is normal. The right ventricular  size is normal. There is mildly elevated pulmonary artery systolic  pressure. The estimated right ventricular systolic pressure is 27.0 mmHg.  4. The mitral valve is normal in structure. No evidence of mitral valve  regurgitation. No evidence of mitral stenosis.  5. The aortic valve is tricuspid. There is moderate calcification of the  aortic valve. There is moderate thickening of the aortic valve. Aortic  valve regurgitation is mild. Mild aortic valve stenosis. Aortic  regurgitation PHT measures 472 msec. Aortic  valve  area, by VTI measures 1.83 cm. Aortic valve mean gradient measures  12.5 mmHg. Aortic valve Vmax measures 2.46 m/s.  6. Aortic dilatation noted. There is mild dilatation of the ascending  aorta, measuring 43 mm.  7. The inferior vena cava is normal in size with greater than 50%  respiratory variability, suggesting right atrial pressure of 3 mmHg.   LHC 11/28/2016:  Mid LAD lesion, 10 %stenosed.  Dist LAD lesion, 10 %stenosed.  Mid Cx lesion, 10 %stenosed.  The left ventricular systolic function is normal.  LV end diastolic pressure is normal.  There is mild aortic valve stenosis.   Normal LV function with an ejection fraction of 60%.  There is evidence for mild LVH.  The aortic valve is not significantly calcified.  There is mild aortic stenosis with a peak to peak gradient of 16 mm.  No significant coronary obstructive disease with a 10% narrowings in the mid LAD, and distal circumflex.  Normal dominant RCA.  RECOMMENDATION: The present study does not demonstrate any significant coronary obstructive disease. There is very mild aortic valve stenosis.  The patient will return to the cardiology care of Dr. Wynonia Calhoun.   Erica Calhoun [facility] Short Stay Center/Anesthesiology Phone [phone] 12/29/2020 3:37 PM

## 2020-12-31 ENCOUNTER — Other Ambulatory Visit: Payer: Self-pay

## 2020-12-31 ENCOUNTER — Inpatient Hospital Stay (HOSPITAL_COMMUNITY)
Admission: RE | Admit: 2020-12-31 | Discharge: 2021-01-01 | DRG: 473 | Disposition: A | Discharge status: Home / Self Care | Payer: Medicare Other | Attending: Neurosurgery | Admitting: Neurosurgery

## 2020-12-31 ENCOUNTER — Inpatient Hospital Stay (HOSPITAL_COMMUNITY): Payer: Medicare Other | Admitting: Anesthesiology

## 2020-12-31 ENCOUNTER — Inpatient Hospital Stay (HOSPITAL_COMMUNITY): Payer: Medicare Other

## 2020-12-31 ENCOUNTER — Inpatient Hospital Stay (HOSPITAL_COMMUNITY): Payer: Medicare Other | Admitting: Physician Assistant

## 2020-12-31 ENCOUNTER — Encounter (HOSPITAL_COMMUNITY)
Admission: RE | Disposition: A | Discharge status: Home / Self Care | Payer: Self-pay | Source: Home / Self Care | Attending: Neurosurgery

## 2020-12-31 ENCOUNTER — Encounter (HOSPITAL_COMMUNITY): Payer: Self-pay | Admitting: Neurosurgery

## 2020-12-31 DIAGNOSIS — Z885 Allergy status to narcotic agent status: Secondary | ICD-10-CM

## 2020-12-31 DIAGNOSIS — E039 Hypothyroidism, unspecified: Secondary | ICD-10-CM | POA: Diagnosis present

## 2020-12-31 DIAGNOSIS — Z981 Arthrodesis status: Secondary | ICD-10-CM | POA: Diagnosis not present

## 2020-12-31 DIAGNOSIS — Z7982 Long term (current) use of aspirin: Secondary | ICD-10-CM

## 2020-12-31 DIAGNOSIS — M797 Fibromyalgia: Secondary | ICD-10-CM | POA: Diagnosis present

## 2020-12-31 DIAGNOSIS — Z8052 Family history of malignant neoplasm of bladder: Secondary | ICD-10-CM

## 2020-12-31 DIAGNOSIS — M4322 Fusion of spine, cervical region: Secondary | ICD-10-CM | POA: Diagnosis not present

## 2020-12-31 DIAGNOSIS — I10 Essential (primary) hypertension: Secondary | ICD-10-CM | POA: Diagnosis present

## 2020-12-31 DIAGNOSIS — M47812 Spondylosis without myelopathy or radiculopathy, cervical region: Secondary | ICD-10-CM | POA: Diagnosis present

## 2020-12-31 DIAGNOSIS — Z886 Allergy status to analgesic agent status: Secondary | ICD-10-CM

## 2020-12-31 DIAGNOSIS — Z888 Allergy status to other drugs, medicaments and biological substances status: Secondary | ICD-10-CM

## 2020-12-31 DIAGNOSIS — Z79899 Other long term (current) drug therapy: Secondary | ICD-10-CM | POA: Diagnosis not present

## 2020-12-31 DIAGNOSIS — E785 Hyperlipidemia, unspecified: Secondary | ICD-10-CM | POA: Diagnosis present

## 2020-12-31 DIAGNOSIS — I700 Atherosclerosis of aorta: Secondary | ICD-10-CM | POA: Diagnosis present

## 2020-12-31 DIAGNOSIS — M8580 Other specified disorders of bone density and structure, unspecified site: Secondary | ICD-10-CM | POA: Diagnosis present

## 2020-12-31 DIAGNOSIS — M532X2 Spinal instabilities, cervical region: Secondary | ICD-10-CM | POA: Diagnosis not present

## 2020-12-31 DIAGNOSIS — Z800 Family history of malignant neoplasm of digestive organs: Secondary | ICD-10-CM | POA: Diagnosis not present

## 2020-12-31 DIAGNOSIS — Z79890 Hormone replacement therapy (postmenopausal): Secondary | ICD-10-CM | POA: Diagnosis not present

## 2020-12-31 DIAGNOSIS — Z419 Encounter for procedure for purposes other than remedying health state, unspecified: Secondary | ICD-10-CM

## 2020-12-31 DIAGNOSIS — Z96653 Presence of artificial knee joint, bilateral: Secondary | ICD-10-CM | POA: Diagnosis present

## 2020-12-31 DIAGNOSIS — Z20822 Contact with and (suspected) exposure to covid-19: Secondary | ICD-10-CM | POA: Diagnosis present

## 2020-12-31 DIAGNOSIS — Z880 Allergy status to penicillin: Secondary | ICD-10-CM | POA: Diagnosis not present

## 2020-12-31 DIAGNOSIS — M4802 Spinal stenosis, cervical region: Secondary | ICD-10-CM | POA: Diagnosis present

## 2020-12-31 HISTORY — PX: ANTERIOR CERVICAL DECOMP/DISCECTOMY FUSION: SHX1161

## 2020-12-31 SURGERY — ANTERIOR CERVICAL DECOMPRESSION/DISCECTOMY FUSION 1 LEVEL/HARDWARE REMOVAL
Anesthesia: General | Site: Neck

## 2020-12-31 MED ORDER — LIDOCAINE 2% (20 MG/ML) 5 ML SYRINGE
INTRAMUSCULAR | Status: AC
  Filled 2020-12-31: qty 5

## 2020-12-31 MED ORDER — FENTANYL CITRATE (PF) 250 MCG/5ML IJ SOLN
INTRAMUSCULAR | Status: AC
  Filled 2020-12-31: qty 5

## 2020-12-31 MED ORDER — MIDAZOLAM HCL 2 MG/2ML IJ SOLN
INTRAMUSCULAR | Status: AC
  Filled 2020-12-31: qty 2

## 2020-12-31 MED ORDER — THROMBIN 5000 UNITS EX SOLR
CUTANEOUS | Status: AC
  Filled 2020-12-31: qty 15000

## 2020-12-31 MED ORDER — VANCOMYCIN HCL 1000 MG/200ML IV SOLN
1000.0000 mg | Freq: Once | INTRAVENOUS | Status: AC
  Administered 2020-12-31: 1000 mg via INTRAVENOUS
  Filled 2020-12-31: qty 200

## 2020-12-31 MED ORDER — THROMBIN 5000 UNITS EX SOLR
CUTANEOUS | Status: DC | PRN
  Administered 2020-12-31 (×2): 5000 [IU] via TOPICAL

## 2020-12-31 MED ORDER — POLYETHYLENE GLYCOL 3350 17 G PO PACK: 17.0000 g | PACK | Freq: Every day | ORAL | Status: DC

## 2020-12-31 MED ORDER — HYDROCHLOROTHIAZIDE 12.5 MG PO CAPS
12.5000 mg | ORAL_CAPSULE | Freq: Every evening | ORAL | Status: DC
  Administered 2020-12-31: 12.5 mg via ORAL
  Filled 2020-12-31: qty 1

## 2020-12-31 MED ORDER — PANTOPRAZOLE SODIUM 40 MG PO TBEC
40.0000 mg | DELAYED_RELEASE_TABLET | Freq: Every day | ORAL | Status: DC
  Administered 2020-12-31: 40 mg via ORAL
  Filled 2020-12-31: qty 1

## 2020-12-31 MED ORDER — OXYCODONE HCL 5 MG PO TABS
10.0000 mg | ORAL_TABLET | ORAL | Status: DC | PRN
Start: 2020-12-31 — End: 2021-01-01

## 2020-12-31 MED ORDER — LEVOTHYROXINE SODIUM 88 MCG PO TABS
88.0000 ug | ORAL_TABLET | ORAL | Status: DC
  Administered 2021-01-01: 88 ug via ORAL
  Filled 2020-12-31: qty 1

## 2020-12-31 MED ORDER — DEXAMETHASONE SODIUM PHOSPHATE 10 MG/ML IJ SOLN
INTRAMUSCULAR | Status: DC | PRN
  Administered 2020-12-31: 10 mg via INTRAVENOUS

## 2020-12-31 MED ORDER — ROCURONIUM BROMIDE 100 MG/10ML IV SOLN
INTRAVENOUS | Status: DC | PRN
  Administered 2020-12-31: 70 mg via INTRAVENOUS
  Administered 2020-12-31: 10 mg via INTRAVENOUS
  Administered 2020-12-31: 20 mg via INTRAVENOUS

## 2020-12-31 MED ORDER — HEMOSTATIC AGENTS (NO CHARGE) OPTIME
TOPICAL | Status: DC | PRN
  Administered 2020-12-31: 1 via TOPICAL

## 2020-12-31 MED ORDER — SUGAMMADEX SODIUM 200 MG/2ML IV SOLN
INTRAVENOUS | Status: DC | PRN
  Administered 2020-12-31 (×2): 100 mg via INTRAVENOUS

## 2020-12-31 MED ORDER — HYDROMORPHONE HCL 1 MG/ML IJ SOLN
0.5000 mg | INTRAMUSCULAR | Status: DC | PRN
Start: 2020-12-31 — End: 2021-01-01
  Administered 2020-12-31: 0.5 mg via INTRAVENOUS
  Filled 2020-12-31: qty 0.5

## 2020-12-31 MED ORDER — ACETAMINOPHEN 10 MG/ML IV SOLN
INTRAVENOUS | Status: DC | PRN
  Administered 2020-12-31: 1000 mg via INTRAVENOUS

## 2020-12-31 MED ORDER — KETOCONAZOLE 2 % EX SHAM
1.0000 "application " | MEDICATED_SHAMPOO | CUTANEOUS | Status: DC
  Filled 2020-12-31: qty 120

## 2020-12-31 MED ORDER — ACETAMINOPHEN 10 MG/ML IV SOLN
1000.0000 mg | Freq: Once | INTRAVENOUS | Status: DC | PRN
  Administered 2020-12-31: 1000 mg via INTRAVENOUS

## 2020-12-31 MED ORDER — ROCURONIUM BROMIDE 10 MG/ML (PF) SYRINGE
PREFILLED_SYRINGE | INTRAVENOUS | Status: AC
  Filled 2020-12-31: qty 10

## 2020-12-31 MED ORDER — THROMBIN 5000 UNITS EX SOLR: OROMUCOSAL | Status: DC | PRN

## 2020-12-31 MED ORDER — LIDOCAINE HCL (CARDIAC) PF 100 MG/5ML IV SOSY
PREFILLED_SYRINGE | INTRAVENOUS | Status: DC | PRN
  Administered 2020-12-31: 50 mg via INTRAVENOUS

## 2020-12-31 MED ORDER — SODIUM CHLORIDE 0.9 % IV SOLN
250.0000 mL | INTRAVENOUS | Status: DC
  Administered 2020-12-31: 250 mL via INTRAVENOUS

## 2020-12-31 MED ORDER — PROPOFOL 10 MG/ML IV BOLUS
INTRAVENOUS | Status: AC
  Filled 2020-12-31: qty 20

## 2020-12-31 MED ORDER — ORAL CARE MOUTH RINSE: 15.0000 mL | Freq: Once | OROMUCOSAL | Status: AC

## 2020-12-31 MED ORDER — ONDANSETRON HCL 4 MG PO TABS: 4.0000 mg | ORAL_TABLET | Freq: Four times a day (QID) | ORAL | Status: DC | PRN

## 2020-12-31 MED ORDER — ACETAMINOPHEN 650 MG RE SUPP: 650.0000 mg | RECTAL | Status: DC | PRN

## 2020-12-31 MED ORDER — EZETIMIBE 10 MG PO TABS
10.0000 mg | ORAL_TABLET | Freq: Every day | ORAL | Status: DC
  Filled 2020-12-31: qty 1

## 2020-12-31 MED ORDER — LACTATED RINGERS IV SOLN: INTRAVENOUS | Status: DC

## 2020-12-31 MED ORDER — FINASTERIDE 5 MG PO TABS: 5.0000 mg | ORAL_TABLET | Freq: Every day | ORAL | Status: DC

## 2020-12-31 MED ORDER — FLUTICASONE PROPIONATE 50 MCG/ACT NA SUSP
1.0000 | Freq: Every day | NASAL | Status: DC | PRN
  Filled 2020-12-31: qty 16

## 2020-12-31 MED ORDER — TRAMADOL HCL 50 MG PO TABS: 50.0000 mg | ORAL_TABLET | Freq: Four times a day (QID) | ORAL | Status: DC | PRN

## 2020-12-31 MED ORDER — ONDANSETRON HCL 4 MG/2ML IJ SOLN
INTRAMUSCULAR | Status: DC | PRN
  Administered 2020-12-31: 4 mg via INTRAVENOUS

## 2020-12-31 MED ORDER — HYDROMORPHONE HCL 1 MG/ML IJ SOLN
INTRAMUSCULAR | Status: AC
  Administered 2020-12-31: 0.25 mg via INTRAVENOUS
  Filled 2020-12-31: qty 1

## 2020-12-31 MED ORDER — ONDANSETRON HCL 4 MG/2ML IJ SOLN: 4.0000 mg | Freq: Once | INTRAMUSCULAR | Status: DC | PRN

## 2020-12-31 MED ORDER — ONDANSETRON HCL 4 MG/2ML IJ SOLN: 4.0000 mg | Freq: Four times a day (QID) | INTRAMUSCULAR | Status: DC | PRN

## 2020-12-31 MED ORDER — PHENYLEPHRINE HCL-NACL 20-0.9 MG/250ML-% IV SOLN
INTRAVENOUS | Status: DC | PRN
  Administered 2020-12-31: 40 ug/min via INTRAVENOUS
  Administered 2020-12-31: 60 ug/min via INTRAVENOUS

## 2020-12-31 MED ORDER — LEVOTHYROXINE SODIUM 100 MCG PO TABS: 100.0000 ug | ORAL_TABLET | ORAL | Status: DC

## 2020-12-31 MED ORDER — FENTANYL CITRATE (PF) 100 MCG/2ML IJ SOLN
INTRAMUSCULAR | Status: DC | PRN
  Administered 2020-12-31 (×5): 50 ug via INTRAVENOUS

## 2020-12-31 MED ORDER — 0.9 % SODIUM CHLORIDE (POUR BTL) OPTIME
TOPICAL | Status: DC | PRN
  Administered 2020-12-31 (×2): 1000 mL

## 2020-12-31 MED ORDER — VANCOMYCIN HCL IN DEXTROSE 1-5 GM/200ML-% IV SOLN
1000.0000 mg | INTRAVENOUS | Status: AC
  Administered 2020-12-31: 1000 mg via INTRAVENOUS
  Filled 2020-12-31: qty 200

## 2020-12-31 MED ORDER — ALUM & MAG HYDROXIDE-SIMETH 200-200-20 MG/5ML PO SUSP: 30.0000 mL | Freq: Four times a day (QID) | ORAL | Status: DC | PRN

## 2020-12-31 MED ORDER — ACETAMINOPHEN 10 MG/ML IV SOLN
INTRAVENOUS | Status: AC
  Filled 2020-12-31: qty 100

## 2020-12-31 MED ORDER — MENTHOL 3 MG MT LOZG: 1.0000 | LOZENGE | OROMUCOSAL | Status: DC | PRN

## 2020-12-31 MED ORDER — HYDROMORPHONE HCL 1 MG/ML IJ SOLN
0.2500 mg | INTRAMUSCULAR | Status: DC | PRN
  Administered 2020-12-31 (×2): 0.25 mg via INTRAVENOUS

## 2020-12-31 MED ORDER — NUTRISOURCE FIBER PO PACK
1.0000 | PACK | Freq: Every day | ORAL | Status: DC
  Filled 2020-12-31: qty 1

## 2020-12-31 MED ORDER — METOPROLOL SUCCINATE ER 25 MG PO TB24: 25.0000 mg | ORAL_TABLET | Freq: Every day | ORAL | Status: DC

## 2020-12-31 MED ORDER — MIDAZOLAM HCL 2 MG/2ML IJ SOLN
INTRAMUSCULAR | Status: DC | PRN
  Administered 2020-12-31: 2 mg via INTRAVENOUS

## 2020-12-31 MED ORDER — TRAMADOL HCL 50 MG PO TABS
50.0000 mg | ORAL_TABLET | Freq: Four times a day (QID) | ORAL | Status: DC | PRN
  Administered 2020-12-31 – 2021-01-01 (×2): 100 mg via ORAL
  Filled 2020-12-31 (×2): qty 2

## 2020-12-31 MED ORDER — ASCORBIC ACID 500 MG PO TABS
1000.0000 mg | ORAL_TABLET | Freq: Two times a day (BID) | ORAL | Status: DC
  Administered 2020-12-31: 1000 mg via ORAL
  Filled 2020-12-31 (×2): qty 2

## 2020-12-31 MED ORDER — SODIUM CHLORIDE 0.9% FLUSH: 3.0000 mL | INTRAVENOUS | Status: DC | PRN

## 2020-12-31 MED ORDER — LOSARTAN POTASSIUM 50 MG PO TABS
100.0000 mg | ORAL_TABLET | Freq: Every evening | ORAL | Status: DC
  Administered 2020-12-31: 100 mg via ORAL
  Filled 2020-12-31: qty 2

## 2020-12-31 MED ORDER — TRIAMCINOLONE ACETONIDE 0.1 % EX CREA
1.0000 "application " | TOPICAL_CREAM | Freq: Every day | CUTANEOUS | Status: DC | PRN
  Filled 2020-12-31: qty 15

## 2020-12-31 MED ORDER — DEXAMETHASONE SODIUM PHOSPHATE 10 MG/ML IJ SOLN
10.0000 mg | Freq: Once | INTRAMUSCULAR | Status: DC
  Filled 2020-12-31: qty 1

## 2020-12-31 MED ORDER — PHENYLEPHRINE 40 MCG/ML (10ML) SYRINGE FOR IV PUSH (FOR BLOOD PRESSURE SUPPORT)
PREFILLED_SYRINGE | INTRAVENOUS | Status: AC
  Filled 2020-12-31: qty 10

## 2020-12-31 MED ORDER — DEXAMETHASONE SODIUM PHOSPHATE 10 MG/ML IJ SOLN
INTRAMUSCULAR | Status: AC
  Filled 2020-12-31: qty 1

## 2020-12-31 MED ORDER — ORAL CARE MOUTH RINSE: 15.0000 mL | Freq: Once | OROMUCOSAL | Status: DC

## 2020-12-31 MED ORDER — LACTATED RINGERS IV SOLN: INTRAVENOUS | Status: DC | PRN

## 2020-12-31 MED ORDER — PANTOPRAZOLE SODIUM 40 MG IV SOLR: 40.0000 mg | Freq: Every day | INTRAVENOUS | Status: DC

## 2020-12-31 MED ORDER — FENTANYL CITRATE (PF) 100 MCG/2ML IJ SOLN: 100.0000 ug | Freq: Once | INTRAMUSCULAR | Status: AC

## 2020-12-31 MED ORDER — POLYVINYL ALCOHOL 1.4 % OP SOLN
1.0000 [drp] | Freq: Two times a day (BID) | OPHTHALMIC | Status: DC | PRN
  Filled 2020-12-31: qty 15

## 2020-12-31 MED ORDER — CHLORHEXIDINE GLUCONATE CLOTH 2 % EX PADS: 6.0000 | MEDICATED_PAD | Freq: Once | CUTANEOUS | Status: DC

## 2020-12-31 MED ORDER — CHLORHEXIDINE GLUCONATE 0.12 % MT SOLN
15.0000 mL | Freq: Once | OROMUCOSAL | Status: AC
  Administered 2020-12-31: 15 mL via OROMUCOSAL
  Filled 2020-12-31: qty 15

## 2020-12-31 MED ORDER — PHENOL 1.4 % MT LIQD: 1.0000 | OROMUCOSAL | Status: DC | PRN

## 2020-12-31 MED ORDER — ACETAMINOPHEN 325 MG PO TABS: 650.0000 mg | ORAL_TABLET | ORAL | Status: DC | PRN

## 2020-12-31 MED ORDER — VITAMIN D (ERGOCALCIFEROL) 1.25 MG (50000 UNIT) PO CAPS: 50000.0000 [IU] | ORAL_CAPSULE | ORAL | Status: DC

## 2020-12-31 MED ORDER — SODIUM CHLORIDE 0.9% FLUSH
3.0000 mL | Freq: Two times a day (BID) | INTRAVENOUS | Status: DC
  Administered 2020-12-31: 3 mL via INTRAVENOUS

## 2020-12-31 MED ORDER — CYCLOBENZAPRINE HCL 10 MG PO TABS
10.0000 mg | ORAL_TABLET | Freq: Three times a day (TID) | ORAL | Status: DC | PRN
  Administered 2020-12-31: 10 mg via ORAL
  Filled 2020-12-31: qty 1

## 2020-12-31 MED ORDER — ONDANSETRON HCL 4 MG/2ML IJ SOLN
INTRAMUSCULAR | Status: AC
  Filled 2020-12-31: qty 2

## 2020-12-31 MED ORDER — CHLORHEXIDINE GLUCONATE 0.12 % MT SOLN: 15.0000 mL | Freq: Once | OROMUCOSAL | Status: DC

## 2020-12-31 MED ORDER — ASPIRIN EC 81 MG PO TBEC
81.0000 mg | DELAYED_RELEASE_TABLET | Freq: Every day | ORAL | Status: DC
  Administered 2020-12-31: 81 mg via ORAL
  Filled 2020-12-31: qty 1

## 2020-12-31 MED ORDER — FENTANYL CITRATE (PF) 100 MCG/2ML IJ SOLN
INTRAMUSCULAR | Status: AC
  Administered 2020-12-31: 100 ug via INTRAVENOUS
  Filled 2020-12-31: qty 2

## 2020-12-31 SURGICAL SUPPLY — 65 items
ADH SKN CLS APL DERMABOND .7 (GAUZE/BANDAGES/DRESSINGS) ×1
APL SKNCLS STERI-STRIP NONHPOA (GAUZE/BANDAGES/DRESSINGS) ×1
BAND INSRT 18 STRL LF DISP RB (MISCELLANEOUS) ×2
BAND RUBBER #18 3X1/16 STRL (MISCELLANEOUS) ×4 IMPLANT
BASKET BONE COLLECTION (BASKET) ×2 IMPLANT
BENZOIN TINCTURE PRP APPL 2/3 (GAUZE/BANDAGES/DRESSINGS) ×2 IMPLANT
BIT DRILL NEURO 2X3.1 SFT TUCH (MISCELLANEOUS) ×1 IMPLANT
BONE VIVIGEN FORMABLE 1.3CC (Bone Implant) ×2 IMPLANT
BUR MATCHSTICK NEURO 3.0 LAGG (BURR) ×2 IMPLANT
CANISTER SUCT 3000ML PPV (MISCELLANEOUS) ×2 IMPLANT
CARTRIDGE OIL MAESTRO DRILL (MISCELLANEOUS) ×1 IMPLANT
COVER WAND RF STERILE (DRAPES) ×1 IMPLANT
DECANTER SPIKE VIAL GLASS SM (MISCELLANEOUS) ×2 IMPLANT
DERMABOND ADVANCED (GAUZE/BANDAGES/DRESSINGS) ×1
DERMABOND ADVANCED .7 DNX12 (GAUZE/BANDAGES/DRESSINGS) IMPLANT
DIFFUSER DRILL AIR PNEUMATIC (MISCELLANEOUS) ×2 IMPLANT
DRAPE C-ARM 42X72 X-RAY (DRAPES) ×4 IMPLANT
DRAPE LAPAROTOMY 100X72 PEDS (DRAPES) ×2 IMPLANT
DRAPE MICROSCOPE LEICA (MISCELLANEOUS) ×2 IMPLANT
DRILL NEURO 2X3.1 SOFT TOUCH (MISCELLANEOUS) ×2
DRSG OPSITE POSTOP 4X6 (GAUZE/BANDAGES/DRESSINGS) ×1 IMPLANT
DURAPREP 6ML APPLICATOR 50/CS (WOUND CARE) ×2 IMPLANT
ELECT COATED BLADE 2.86 ST (ELECTRODE) ×2 IMPLANT
ELECT REM PT RETURN 9FT ADLT (ELECTROSURGICAL) ×2
ELECTRODE REM PT RTRN 9FT ADLT (ELECTROSURGICAL) ×1 IMPLANT
GAUZE 4X4 16PLY RFD (DISPOSABLE) IMPLANT
GAUZE SPONGE 4X4 12PLY STRL (GAUZE/BANDAGES/DRESSINGS) ×1 IMPLANT
GLOVE BIO SURGEON STRL SZ7 (GLOVE) ×1 IMPLANT
GLOVE BIO SURGEON STRL SZ8 (GLOVE) ×2 IMPLANT
GLOVE EXAM NITRILE XL STR (GLOVE) IMPLANT
GLOVE INDICATOR 8.5 STRL (GLOVE) ×2 IMPLANT
GLOVE SURG UNDER POLY LF SZ7 (GLOVE) ×1 IMPLANT
GOWN STRL REUS W/ TWL LRG LVL3 (GOWN DISPOSABLE) ×1 IMPLANT
GOWN STRL REUS W/ TWL XL LVL3 (GOWN DISPOSABLE) ×1 IMPLANT
GOWN STRL REUS W/TWL 2XL LVL3 (GOWN DISPOSABLE) ×1 IMPLANT
GOWN STRL REUS W/TWL LRG LVL3 (GOWN DISPOSABLE) ×6
GOWN STRL REUS W/TWL XL LVL3 (GOWN DISPOSABLE) ×2
GRAFT BNE MATRIX VG FRMBL SM 1 (Bone Implant) IMPLANT
HALTER HD/CHIN CERV TRACTION D (MISCELLANEOUS) ×2 IMPLANT
HEMOSTAT POWDER KIT SURGIFOAM (HEMOSTASIS) ×2 IMPLANT
KIT BASIN OR (CUSTOM PROCEDURE TRAY) ×2 IMPLANT
KIT TURNOVER KIT B (KITS) ×2 IMPLANT
NDL HYPO 18GX1.5 BLUNT FILL (NEEDLE) ×1 IMPLANT
NDL SPNL 20GX3.5 QUINCKE YW (NEEDLE) ×1 IMPLANT
NEEDLE HYPO 18GX1.5 BLUNT FILL (NEEDLE) IMPLANT
NEEDLE SPNL 20GX3.5 QUINCKE YW (NEEDLE) ×2 IMPLANT
NS IRRIG 1000ML POUR BTL (IV SOLUTION) ×3 IMPLANT
OIL CARTRIDGE MAESTRO DRILL (MISCELLANEOUS) ×2
PACK LAMINECTOMY NEURO (CUSTOM PROCEDURE TRAY) ×2 IMPLANT
PAD ARMBOARD 7.5X6 YLW CONV (MISCELLANEOUS) ×6 IMPLANT
PIN DISTRACTION 14MM (PIN) ×2 IMPLANT
PLATE ANT CERV XTEND 1 LV 18 (Plate) ×1 IMPLANT
RASP 3.0MM (RASP) ×1 IMPLANT
SCREW VAR 4.2 XD SELF DRILL 14 (Screw) ×1 IMPLANT
SCREW XTEND SELFTAP VAR 4.6X14 (Screw) ×3 IMPLANT
SPACER HEDRON C 12X14X8 0D (Spacer) ×1 IMPLANT
SPONGE INTESTINAL PEANUT (DISPOSABLE) ×2 IMPLANT
SPONGE SURGIFOAM ABS GEL SZ50 (HEMOSTASIS) ×2 IMPLANT
STRIP CLOSURE SKIN 1/2X4 (GAUZE/BANDAGES/DRESSINGS) ×2 IMPLANT
SUT VIC AB 3-0 SH 8-18 (SUTURE) ×2 IMPLANT
SUT VICRYL 4-0 PS2 18IN ABS (SUTURE) ×2 IMPLANT
TAPE CLOTH 4X10 WHT NS (GAUZE/BANDAGES/DRESSINGS) IMPLANT
TOWEL GREEN STERILE (TOWEL DISPOSABLE) ×2 IMPLANT
TOWEL GREEN STERILE FF (TOWEL DISPOSABLE) ×2 IMPLANT
WATER STERILE IRR 1000ML POUR (IV SOLUTION) ×2 IMPLANT

## 2020-12-31 NOTE — Progress Notes (Signed)
Patient complaining of pain 10/10 in her neck with BP elevated 202/65 at 10:57 o'clock. MD notified and patient received Fentanyl 100 mcg. Pain decreased and BP 175/72 at 12:20 o'clock . At 11:55 o'clock BP increased to 237/88. MD notified.

## 2020-12-31 NOTE — H&P (Signed)
Erica Calhoun is an 70 y.o. female.   Chief Complaint: Neck pain HPI: 70 year old female with progressive worsening neck bilateral shoulder and arm pain work-up revealed large disc radiation with severe cord compression at C3-4 above the level of previous C4-C7 fusion.  Due to patient's progression of clinical syndrome imaging findings and failure conservative treatment I recommended an ACDF at C3-4 with either removal of hardware or cutting off the top of the plate.  I have extensively gone over the risks and benefits of that operation with her as well as perioperative course expectations of outcome and alternatives of surgery and she understands and agrees to proceed forward.  Past Medical History:  Diagnosis Date  . Acquired dilation of ascending aorta and aortic root ([place])    85mm by echo 12/2020 and 4cm by Chest CTA 12/2020   . Aortic atherosclerosis ([place])   . Aortic insufficiency    mild AI by echo 12/2020  . Aortic stenosis    mild by echo 12/2020  . Arthritis    cervical and lower back   . Carotid artery stenosis    1-39% bilateral carotid artery stenosis by dopplers 12/2020  . Chest pain    s/p LHC 11/28/16: 10% mLAd, dLAD, mCx, EF 60%, very mild AS (peak grad 16) - cardiologist Dr. Wynonia Lawman  . Dyslipidemia   . Fibromyalgia   . Heart murmur   . History of hiatal hernia   . History of recurrent UTIs   . Hypertension   . Hypothyroidism   . Osteopenia   . Pneumonia    hx/1/17  . Sinusitis   . Ureter obstruction    right side, back in 1980's    No problems since    Past Surgical History:  Procedure Laterality Date  . ANTERIOR CERVICAL DECOMP/DISCECTOMY FUSION N/A 10/02/2016   Procedure: Anterior Cervical Diskectomy and Fusion - Cervical four- Cervical five - Cervical five-Cervical six - Cervical six -Cervical seven;  Surgeon: Kary Kos, MD;  Location: [place];  Service: Neurosurgery;  Laterality: N/A;  . ANTERIOR LAT LUMBAR FUSION Left 11/05/2017   Procedure: LUMBAR TWO-THREE  ANTERIOR LATERAL LUMBAR FUSION WITH LATERAL PLATE;  Surgeon: Kary Kos, MD;  Location: [place];  Service: Neurosurgery;  Laterality: Left;  . APPENDECTOMY  1978  . BUNIONECTOMY Bilateral   . CARDIAC CATHETERIZATION     2018 with Dr. Wynonia Lawman  . cyst on ovary     left ovary..  . CYSTOSCOPY N/A 01/23/2018   Procedure: CYSTOSCOPY;  Surgeon: Paula Compton, MD;  Location: [facility];  Service: Gynecology;  Laterality: N/A;  . JOINT REPLACEMENT     bilateral total knees Dr. Alvan Dame 06-20-18  . LAPAROSCOPIC VAGINAL HYSTERECTOMY WITH SALPINGO OOPHORECTOMY Bilateral 01/23/2018   Procedure: LAPAROSCOPIC ASSISTED VAGINAL HYSTERECTOMY WITH SALPINGO OOPHORECTOMY;  Surgeon: Paula Compton, MD;  Location: [facility];  Service: Gynecology;  Laterality: Bilateral;  . LEFT HEART CATH AND CORONARY ANGIOGRAPHY N/A 11/28/2016   Procedure: Left Heart Cath and Coronary Angiography;  Surgeon: Troy Sine, MD;  Location: [facility];  Service: Cardiovascular;  Laterality: N/A;  . TOTAL KNEE ARTHROPLASTY Bilateral 06/20/2018   Procedure: TOTAL KNEE BILATERAL;  Surgeon: Paralee Cancel, MD;  Location: [facility];  Service: Orthopedics;  Laterality: Bilateral;  2.5 hrs  . TUBAL LIGATION    . wisdon teeth      Family History  Problem Relation Age of Onset  . Bladder Cancer Father   . Bladder Cancer Brother   . Colon cancer Maternal Grandmother   .  Goiter Mother   . Stomach cancer Neg Hx   . Rectal cancer Neg Hx   . Esophageal cancer Neg Hx   . Pancreatic cancer Neg Hx    Social History:  reports that she has never smoked. She has never used smokeless tobacco. She reports that she does not drink alcohol and does not use drugs.  Allergies:  Allergies  Allergen Reactions  . Penicillins Shortness Of Breath, Swelling, Rash and Other (See Comments)    Has patient had a PCN reaction causing immediate rash, facial/tongue/throat swelling, SOB or lightheadedness with hypotension:Yes Has patient had a PCN reaction causing  severe rash involving mucus membranes or skin necrosis: No Has patient had a PCN reaction that required hospitalization No Has patient had a PCN reaction occurring within the last 10 years: No If all of the above answers are "NO", then may proceed with Cephalosporin use.   . Adhesive [Tape] Other (See Comments)    Blisters skin - MUST USE PAPER TAPE  . Aspirin Other (See Comments)    In high doses causes ears rings   . Codeine Other (See Comments)    headache  . Morphine Other (See Comments)    headache    Medications Prior to Admission  Medication Sig Dispense Refill  . Ascorbic Acid (VITAMIN C) 1000 MG tablet Take 1,000 mg by mouth 2 (two) times daily.    . carboxymethylcellulose (REFRESH PLUS) 0.5 % SOLN Apply 1-2 drops to eye 2 (two) times daily as needed (dry/irritated eyes.).    Marland Kitchen ezetimibe (ZETIA) 10 MG tablet Take 10 mg by mouth in the morning.    . fluticasone (FLONASE) 50 MCG/ACT nasal spray Place 1 spray into both nostrils daily as needed for allergies (sinus issues.).    Marland Kitchen Glucosamine-Chondroitin (COSAMIN DS PO) Take 1 capsule by mouth in the morning and at bedtime.    . hydrochlorothiazide (MICROZIDE) 12.5 MG capsule Take 12.5 mg by mouth every evening.    Marland Kitchen losartan (COZAAR) 100 MG tablet Take 100 mg by mouth every evening.    . metoprolol succinate (TOPROL-XL) 25 MG 24 hr tablet Take 25 mg by mouth in the morning.    . Multiple Vitamins-Minerals (HAIR VITAMINS PO) Take 1 capsule by mouth in the morning and at bedtime. Nutrafol    . polyethylene glycol (MIRALAX / GLYCOLAX) packet Take 17 g by mouth 2 (two) times daily. (Patient taking differently: Take 17 g by mouth in the morning.) 14 each 0  . traMADol (ULTRAM) 50 MG tablet Take 50 mg by mouth every 6 (six) hours as needed (pain.).    Marland Kitchen Vitamin D, Ergocalciferol, (DRISDOL) 50000 units CAPS capsule Take 50,000 Units by mouth every Friday.    Marland Kitchen aspirin EC 81 MG tablet Take 81 mg by mouth at bedtime. Swallow whole.    .  finasteride (PROSCAR) 5 MG tablet Take 5 mg by mouth in the morning.    Marland Kitchen ketoconazole (NIZORAL) 2 % shampoo Apply 1 application topically 2 (two) times a week.    . levothyroxine (SYNTHROID) 100 MCG tablet Take 100 mcg by mouth every other day. In the morning on an empty stomach    . levothyroxine (SYNTHROID, LEVOTHROID) 88 MCG tablet Take 88 mcg by mouth every other day. In the morning on an empty stomach  12  . triamcinolone cream (KENALOG) 0.1 % Apply 1 application topically daily as needed (skin irritation/rash).    . Wheat Dextrin (BENEFIBER) POWD Take 1 Scoop by mouth in the morning.  Mixed with miralax      No results found for this or any previous visit (from the past 48 hour(s)). No results found.  Review of Systems  Musculoskeletal: Positive for neck pain.    Blood pressure (!) 175/72, pulse 72, temperature 98.3 F (36.8 C), temperature source Oral, resp. rate 18, height 5\' 3"  (1.6 m), weight 90.7 kg, SpO2 100 %. Physical Exam HENT:     Head: Normocephalic.     Right Ear: Tympanic membrane normal.     Nose: Nose normal. No congestion.  Eyes:     Pupils: Pupils are equal, round, and reactive to light.  Cardiovascular:     Pulses: Normal pulses.  Pulmonary:     Effort: Pulmonary effort is normal.  Abdominal:     General: Abdomen is flat.  Skin:    General: Skin is warm.     Capillary Refill: Capillary refill takes less than 2 seconds.  Neurological:     General: No focal deficit present.     Mental Status: She is alert.     Comments: Patient is awake and alert strength is 5-5 deltoid, bicep, tricep, wrist flexion, wrist extension, hand intrinsics.      Assessment/Plan 70 year old female presents for an ACDF C3-4  Elaina Hoops, MD 12/31/2020, 12:38 PM

## 2020-12-31 NOTE — Op Note (Signed)
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  Anterior cervical plating utilizing the globus extend plating system  3.  Expiration fusion and cutting of the old plate superiorly removing the screws in C4 to create space for the new plate.  Surgeon: Dominica Severin Florice Hindle  Assistant: Nash Shearer  Anesthesia: General  EBL: Minimal  HPI: 70 year old female progressive worsening neck pain bilateral shoulder pain and interscapular pain work-up revealed instability with excessive motion at C3-4 on flexion-extension and spondylosis and stenosis on MRI.  Due to her progression of clinical syndrome imaging findings and failed conservative treatment I recommended anterior cervical discectomy and fusion at C3-4.  I extensively reviewed the risks and benefits of that operation with her as well as perioperative course expectations of outcome and alternatives of surgery and she understood and agreed to proceed forward.  Operative procedure: Patient was brought into the OR was [facility] general anesthesia positioned supine the neck in slight extension 5 pounds halter traction the right 7 neck was prepped and draped in routine sterile fashion preoperative x-ray localized the appropriate level so a curvilinear incision was made just off the midline to the anterior border of the sternocleidomastoid and the superficial layer of platysma was dissected out divided longitudinally.  The avascular plane between the sternomastoid and strap muscle was developed down to the prevertebral fascia and prevertebral fascia was dissected away with Kitners.  I immediately identified the old plate and dissected superiorly reflected the longus Coley laterally and self-retaining retractor was placed.  Disc base was then  incised and cleaned out this was noted markedly degenerated utilizing combination of Epstein curettes and high-speed drill disc base was drilled down capturing the bone shavings and mucus trap.  Under microscopic nation further under biting of the endplates allowed identification of posterior longitudinal ligament there was marked disc disruption as well as osteophytes coming off the posterior aspect of both C3 and C4 these were all aggressively under Bitton marching laterally both C4 pedicles identified both C4 nerve roots were decompressed and skeletonized flush with pedicle.  Then I sized up a 8 mm titanium cage packed with locally harvested autograft mixed and inserted it 1 to 2 mm deep to the anterior vertebral line.  Then we drilled off the superior aspect of the translational plate remove the 2 C4 translational screws sized up an 18 mm globus extend plate with rescue screws and previous holes as well as replaced one of the C3 screws with a rescue as it appeared to be stripped.  Ultimately all screws had excellent purchase locking mechanism was engaged wound was copiously irrigated some additional bone graft been packed laterally to the cage and in front of the cage under the plate.  Then I closed the wound in layers with Vicryl in a running 4 subcuticular Dermabond benzoin Steri-Strips and a sterile dressing was applied patient recovery in stable condition.  At the end the case all needle count sponge counts were correct.

## 2020-12-31 NOTE — Anesthesia Postprocedure Evaluation (Signed)
Anesthesia Post Note  Patient: CALLISTA HOH  Procedure(s) Performed: Anterior Cervical Decompression/Discectomy Fusion Cervical three-four with removal of hardware Cervical four-five (N/A Neck)     Patient location during evaluation: PACU Anesthesia Type: General Level of consciousness: awake and alert Pain management: pain level controlled Vital Signs Assessment: post-procedure vital signs reviewed and stable Respiratory status: spontaneous breathing, nonlabored ventilation, respiratory function stable and patient connected to nasal cannula oxygen Cardiovascular status: blood pressure returned to baseline and stable Postop Assessment: no apparent nausea or vomiting Anesthetic complications: no   No complications documented.  Last Vitals:  Vitals:   12/31/20 1505 12/31/20 1519  BP: 107/84 (!) 128/100  Pulse: 64 80  Resp: 13 16  Temp:    SpO2: 100% 100%    Last Pain:  Vitals:   12/31/20 1519  TempSrc:   PainSc: 7                  Robecca Fulgham S

## 2020-12-31 NOTE — Transfer of Care (Signed)
Immediate Anesthesia Transfer of Care Note  Patient: Erica Calhoun  Procedure(s) Performed: Anterior Cervical Decompression/Discectomy Fusion Cervical three-four with removal of hardware Cervical four-five (N/A Neck)  Patient Location: PACU  Anesthesia Type:General  Level of Consciousness: awake, alert  and patient cooperative  Airway & Oxygen Therapy: Patient Spontanous Breathing and Patient connected to face mask oxygen  Post-op Assessment: Report given to RN, Post -op Vital signs reviewed and stable and Patient moving all extremities X 4  Post vital signs: Reviewed  Last Vitals:  Vitals Value Taken Time  BP 155/95 12/31/20 1450  Temp    Pulse 71 12/31/20 1453  Resp 15 12/31/20 1453  SpO2 100 % 12/31/20 1453  Vitals shown include unvalidated device data.  Last Pain:  Vitals:   12/31/20 1026  TempSrc: Oral         Complications: No complications documented.

## 2020-12-31 NOTE — Progress Notes (Signed)
Pharmacy Antibiotic Note  Erica Calhoun is a 70 y.o. female admitted on 12/31/2020 for planned spinal surgery.  Pharmacy has been consulted for Vancomycin dosing post-op.  The patient received Vancomycin pre-op at 1100, confirmed with the RN that no drain is in place.   Plan: - Vancomycin 1g x 1 dose at 2300 this evening - Pharmacy will sign off as no further doses expected at this time.  Height: 5\' 3"  (160 cm) Weight: 90.7 kg (200 lb) IBW/kg (Calculated) : 52.4  Temp (24hrs), Avg:98.1 F (36.7 C), Min:97.8 F (36.6 C), Max:98.3 F (36.8 C)  Recent Labs  Lab 12/28/20 1343  WBC 7.3  CREATININE 0.60    Estimated Creatinine Clearance: 70.9 mL/min (by C-G formula based on SCr of 0.6 mg/dL).    Allergies  Allergen Reactions  . Penicillins Shortness Of Breath, Swelling, Rash and Other (See Comments)    Has patient had a PCN reaction causing immediate rash, facial/tongue/throat swelling, SOB or lightheadedness with hypotension:Yes Has patient had a PCN reaction causing severe rash involving mucus membranes or skin necrosis: No Has patient had a PCN reaction that required hospitalization No Has patient had a PCN reaction occurring within the last 10 years: No If all of the above answers are "NO", then may proceed with Cephalosporin use.   . Adhesive [Tape] Other (See Comments)    Blisters skin - MUST USE PAPER TAPE  . Aspirin Other (See Comments)    In high doses causes ears rings   . Codeine Other (See Comments)    headache  . Morphine Other (See Comments)    headache    Thank you for allowing pharmacy to be a part of this patient's care.  [name], PharmD, BCPS Clinical Pharmacist Clinical phone for 12/31/2020: [phone] 12/31/2020 4:43 PM   **Pharmacist phone directory can now be found on [email or web address] ([email or web address]).  Listed under [place].

## 2020-12-31 NOTE — Anesthesia Procedure Notes (Signed)
Procedure Name: Intubation Date/Time: 12/31/2020 1:06 PM Performed by: Rayvon Char, CRNA Pre-anesthesia Checklist: Patient identified and Emergency Drugs available Patient Re-evaluated:Patient Re-evaluated prior to induction Oxygen Delivery Method: Circle system utilized Preoxygenation: Pre-oxygenation with 100% oxygen Induction Type: IV induction and Cricoid Pressure applied Ventilation: Mask ventilation without difficulty Laryngoscope Size: Glidescope Tube type: Oral Tube size: 7.0 mm Number of attempts: 1 Airway Equipment and Method: Video-laryngoscopy Placement Confirmation: ETT inserted through vocal cords under direct vision and positive ETCO2 Secured at: 22 cm Tube secured with: Tape Dental Injury: Teeth and Oropharynx as per pre-operative assessment  Future Recommendations: Recommend- induction with short-acting agent, and alternative techniques readily available

## 2020-12-31 NOTE — Progress Notes (Signed)
Orthopedic Tech Progress Note Patient Details:  Erica Calhoun 11-16-1950 [id number]  Ortho Devices Type of Ortho Device: Soft collar Ortho Device/Splint Interventions: Ordered       Germaine Pomfret 12/31/2020, 5:19 PM

## 2021-01-01 MED ORDER — CYCLOBENZAPRINE HCL 10 MG PO TABS: 10.0000 mg | ORAL_TABLET | Freq: Three times a day (TID) | ORAL | 2 refills | Status: AC | PRN

## 2021-01-01 MED ORDER — ASPIRIN EC 81 MG PO TBEC: 81.0000 mg | DELAYED_RELEASE_TABLET | Freq: Every day | ORAL | 11 refills | Status: AC

## 2021-01-01 MED ORDER — TRAMADOL HCL 50 MG PO TABS: 50.0000 mg | ORAL_TABLET | Freq: Four times a day (QID) | ORAL | 0 refills | Status: AC | PRN

## 2021-01-01 NOTE — Progress Notes (Signed)
Patient is discharged from room 3C04 at this time. Alert and in stable condition. IV site d/c'd and instructions read to patient with understanding verbalized and all questions answered. Left unit via wheelchair with all belongings at side. 

## 2021-01-01 NOTE — Discharge Instructions (Signed)
Wound Care  Keep the incision clean and dry remove the outer dressing in 2 days, leave the Steri-Strips intact.  Do not put any creams, lotions, or ointments on incision. Leave steri-strips on neck.  They will fall off by themselves.  Activity Walk each and every day, increasing distance each day. No lifting greater than 5 lbs.  Avoid excessive neck motion. No lifting no bending no twisting no driving or riding a car unless coming back and forth to see me. Wear neck brace at all times except when showering.   Diet Resume your normal diet.   Return to Work Will be discussed at you follow up appointment.  Call Your Doctor If Any of These Occur Redness, drainage, or swelling at the wound.  Temperature greater than 101 degrees. Severe pain not relieved by pain medication. Incision starts to come apart.  Follow Up Appt Call today for appointment in 1-2 weeks ([phone]) or for problems.  If you have any hardware placed in your spine, you will need an x-ray before your appointment.   

## 2021-01-01 NOTE — Evaluation (Signed)
Physical Therapy Evaluation & Discharge Patient Details Name: Erica Calhoun MRN: [id number] DOB: 1950-12-10 Today's Date: 01/01/2021   History of Present Illness  70 y/o female s/p ACDF C3-4 and hardware removal C4 on 4/29. PMH: carotid artery stenosis, aortic stenosis, fibromyalgia, HTN, hypothyroidism, osteopenia, dyslipidemia  Clinical Impression  PTA, patient lives alone and independent with mobility. Patient currently functioning at independent level with no AD. Educated patient on cervical precautions and provided handout. Patient maintaining cervical precautions throughout mobility. No further skilled PT needs required acutely. No PT follow up recommended at this time. PT will sign off.     Follow Up Recommendations No PT follow up    Equipment Recommendations  None recommended by PT    Recommendations for Other Services       Precautions / Restrictions Precautions Precautions: Cervical Precaution Booklet Issued: Yes (comment) Required Braces or Orthoses: Cervical Brace Cervical Brace: Soft collar;At all times Restrictions Weight Bearing Restrictions: No      Mobility  Bed Mobility Overal bed mobility: Independent                  Transfers Overall transfer level: Independent                  Ambulation/Gait Ambulation/Gait assistance: Independent Gait Distance (Feet): 300 Feet Assistive device: None Gait Pattern/deviations: WFL(Within Functional Limits)   Gait velocity interpretation: >4.37 ft/sec, indicative of normal walking speed    Stairs            Wheelchair Mobility    Modified Rankin (Stroke Patients Only)       Balance Overall balance assessment: No apparent balance deficits (not formally assessed)                                           Pertinent Vitals/Pain Pain Assessment: Faces Faces Pain Scale: Hurts a little bit Pain Location: neck Pain Descriptors / Indicators: Grimacing Pain  Intervention(s): Monitored during session    Home Living Family/patient expects to be discharged to:: Private residence Living Arrangements: Alone Available Help at Discharge: Friend(s);Available PRN/intermittently Type of Home: House Home Access: Ramped entrance     Home Layout: One level Home Equipment: None      Prior Function Level of Independence: Independent         Comments: drives, retired     Journalist, newspaper        Extremity/Trunk Assessment   Upper Extremity Assessment Upper Extremity Assessment: Overall WFL for tasks assessed    Lower Extremity Assessment Lower Extremity Assessment: Overall WFL for tasks assessed    Cervical / Trunk Assessment Cervical / Trunk Assessment: Other exceptions Cervical / Trunk Exceptions: s/p ACDF  Communication      Cognition Arousal/Alertness: Awake/alert Behavior During Therapy: WFL for tasks assessed/performed Overall Cognitive Status: Within Functional Limits for tasks assessed                                        General Comments      Exercises     Assessment/Plan    PT Assessment Patent does not need any further PT services  PT Problem List         PT Treatment Interventions      PT Goals (Current goals can be found in the Care  Plan section)  Acute Rehab PT Goals Patient Stated Goal: to go home PT Goal Formulation: With patient    Frequency     Barriers to discharge        Co-evaluation               AM-PAC PT "6 Clicks" Mobility  Outcome Measure Help needed turning from your back to your side while in a flat bed without using bedrails?: None Help needed moving from lying on your back to sitting on the side of a flat bed without using bedrails?: None Help needed moving to and from a bed to a chair (including a wheelchair)?: None Help needed standing up from a chair using your arms (e.g., wheelchair or bedside chair)?: None Help needed to walk in hospital room?:  None Help needed climbing 3-5 steps with a railing? : None 6 Click Score: 24    End of Session Equipment Utilized During Treatment: Cervical collar Activity Tolerance: Patient tolerated treatment well Patient left: in bed;with call bell/phone within reach Nurse Communication: Mobility status PT Visit Diagnosis: Muscle weakness (generalized) (M62.81)    Time: 5852-7782 PT Time Calculation (min) (ACUTE ONLY): 8 min   Charges:   PT Evaluation $PT Eval Low Complexity: 1 Low          Kavontae Pritchard A. Gilford Rile PT, DPT Acute Rehabilitation Services Pager [phone] Office [phone]   Linna Hoff 01/01/2021, 8:58 AM

## 2021-01-01 NOTE — Evaluation (Signed)
Occupational Therapy Evaluation Patient Details Name: Erica Calhoun MRN: [id number] DOB: 14-May-1951 Today's Date: 01/01/2021    History of Present Illness 70 y/o female s/p ACDF C3-4 and hardware removal C4 on 4/29. PMH: carotid artery stenosis, aortic stenosis, fibromyalgia, HTN, hypothyroidism, osteopenia, dyslipidemia   Clinical Impression   Patient admitted for the above diagnosis and procedure.  She is doing extremely well, and is well versed in all precautions and soft collar management.  PTA she is independent with all care and mobility, and she is at her baseline.  All questions answered, and no post acute needs identified.      Follow Up Recommendations  No OT follow up    Equipment Recommendations  None recommended by OT    Recommendations for Other Services       Precautions / Restrictions Precautions Precautions: Cervical Precaution Booklet Issued: Yes (comment) Required Braces or Orthoses: Cervical Brace Cervical Brace: Soft collar;For comfort Restrictions Weight Bearing Restrictions: No      Mobility Bed Mobility Overal bed mobility: Independent               Patient Response: Cooperative  Transfers Overall transfer level: Independent                    Balance Overall balance assessment: No apparent balance deficits (not formally assessed)                                         ADL either performed or assessed with clinical judgement   ADL Overall ADL's : At baseline                                             Vision Baseline Vision/History: Wears glasses Wears Glasses: At all times Patient Visual Report: No change from baseline       Perception     Praxis      Pertinent Vitals/Pain Pain Assessment: 0-10 Pain Score: 3  Faces Pain Scale: Hurts a little bit Pain Location: neck Pain Descriptors / Indicators: Operative site guarding Pain Intervention(s): Monitored during session      Hand Dominance Right   Extremity/Trunk Assessment Upper Extremity Assessment Upper Extremity Assessment: Overall WFL for tasks assessed   Lower Extremity Assessment Lower Extremity Assessment: Defer to PT evaluation   Cervical / Trunk Assessment Cervical / Trunk Assessment: Other exceptions Cervical / Trunk Exceptions: s/p ACDF   Communication Communication Communication: No difficulties   Cognition Arousal/Alertness: Awake/alert Behavior During Therapy: WFL for tasks assessed/performed Overall Cognitive Status: Within Functional Limits for tasks assessed                                                      Home Living Family/patient expects to be discharged to:: Private residence Living Arrangements: Alone Available Help at Discharge: Friend(s);Available PRN/intermittently Type of Home: House Home Access: Ramped entrance     Home Layout: One level     Bathroom Shower/Tub: Occupational psychologist: Handicapped height     Home Equipment: None          Prior Functioning/Environment Level of Independence:  Independent        Comments: drives, retired        OT Problem List: Pain      OT Treatment/Interventions:      OT Goals(Current goals can be found in the care plan section) Acute Rehab OT Goals Patient Stated Goal: to go home OT Goal Formulation: With patient Time For Goal Achievement: 01/01/21 Potential to Achieve Goals: Good  OT Frequency:     Barriers to D/C:  none noted          Co-evaluation              AM-PAC OT "6 Clicks" Daily Activity     Outcome Measure Help from another person eating meals?: None Help from another person taking care of personal grooming?: None Help from another person toileting, which includes using toliet, bedpan, or urinal?: None Help from another person bathing (including washing, rinsing, drying)?: None Help from another person to put on and taking off regular upper  body clothing?: None Help from another person to put on and taking off regular lower body clothing?: None 6 Click Score: 24   End of Session Equipment Utilized During Treatment: Cervical collar  Activity Tolerance: Patient tolerated treatment well Patient left: in bed;with call bell/phone within reach  OT Visit Diagnosis: Pain Pain - Right/Left:  (cervical)                Time: 0820-0829 OT Time Calculation (min): 9 min Charges:  OT General Charges $OT Visit: 1 Visit OT Evaluation $OT Eval Low Complexity: 1 Low  01/01/2021  Rich, OTR/L  Acute Rehabilitation Services  Office:  [phone]   Erica Calhoun 01/01/2021, 9:08 AM

## 2021-01-01 NOTE — Discharge Summary (Signed)
Physician Discharge Summary  Patient ID: Erica Calhoun MRN: [id number] DOB/AGE: April 08, 1951 70 y.o.  Admit date: 12/31/2020 Discharge date: 01/01/2021  Admission Diagnoses:  Spinal stenosis, cervical  Discharge Diagnoses:  Same Active Problems:   Spinal stenosis of cervical region   Discharged Condition: Stable  Hospital Course:  Erica Calhoun is a 70 y.o. female who was admitted for the below procedure. There were no post operative complications. At time of discharge, pain was well controlled, ambulating with Pt/OT, tolerating po, voiding normal. Ready for discharge.  Treatments: Surgery Anterior cervical discectomy fusion C3-4 utilizing the globus [place] titanium cage packed with locally harvested autograft mixed with vivigen 2.  Anterior cervical plating utilizing the globus extend plating system 3.  Expiration fusion and cutting of the old plate superiorly removing the screws in C4 to create space for the new plate.  Discharge Exam: Blood pressure (!) 148/64, pulse 63, temperature (!) 97.5 F (36.4 C), temperature source Oral, resp. rate 18, height 5\' 3"  (1.6 m), weight 90.7 kg, SpO2 94 %. Awake, alert, oriented Speech fluent, appropriate CN grossly intact 5/5 BUE/BLE Wound c/d/i  Disposition: Discharge disposition: 01-Home or Self Care       Discharge Instructions    Call MD for:  difficulty breathing, headache or visual disturbances   Complete by: As directed    Call MD for:  persistant dizziness or light-headedness   Complete by: As directed    Call MD for:  redness, tenderness, or signs of infection (pain, swelling, redness, odor or green/yellow discharge around incision site)   Complete by: As directed    Call MD for:  severe uncontrolled pain   Complete by: As directed    Call MD for:  temperature >100.4   Complete by: As directed    Diet - low sodium heart healthy   Complete by: As directed    Driving Restrictions   Complete by: As directed     Do not drive until given clearance.   Increase activity slowly   Complete by: As directed    Lifting restrictions   Complete by: As directed    Do not lift anything >10lbs. Avoid bending and twisting in awkward positions. Avoid bending at the back.   May shower / Bathe   Complete by: As directed    In 24 hours. Okay to wash wound with warm soapy water. Avoid scrubbing the wound. Pat dry.   Remove dressing in 48 hours   Complete by: As directed      Allergies as of 01/01/2021      Reactions   Penicillins Shortness Of Breath, Swelling, Rash, Other (See Comments)   Has patient had a PCN reaction causing immediate rash, facial/tongue/throat swelling, SOB or lightheadedness with hypotension:Yes Has patient had a PCN reaction causing severe rash involving mucus membranes or skin necrosis: No Has patient had a PCN reaction that required hospitalization No Has patient had a PCN reaction occurring within the last 10 years: No If all of the above answers are "NO", then may proceed with Cephalosporin use.   Adhesive [tape] Other (See Comments)   Blisters skin - MUST USE PAPER TAPE   Aspirin Other (See Comments)   In high doses causes ears rings   Codeine Other (See Comments)   headache   Morphine Other (See Comments)   headache      Medication List    TAKE these medications   aspirin EC 81 MG tablet Take 1 tablet (81 mg total) by mouth  at bedtime. Swallow whole. Start taking on: Jan 06, 2021 What changed: These instructions start on Jan 06, 2021. If you are unsure what to do until then, ask your doctor or other care provider.   Benefiber Powd Take 1 Scoop by mouth in the morning. Mixed with miralax   carboxymethylcellulose 0.5 % Soln Commonly known as: REFRESH PLUS Apply 1-2 drops to eye 2 (two) times daily as needed (dry/irritated eyes.).   COSAMIN DS PO Take 1 capsule by mouth in the morning and at bedtime.   cyclobenzaprine 10 MG tablet Commonly known as: FLEXERIL Take 1  tablet (10 mg total) by mouth 3 (three) times daily as needed for muscle spasms.   ezetimibe 10 MG tablet Commonly known as: ZETIA Take 10 mg by mouth in the morning.   finasteride 5 MG tablet Commonly known as: PROSCAR Take 5 mg by mouth in the morning.   fluticasone 50 MCG/ACT nasal spray Commonly known as: FLONASE Place 1 spray into both nostrils daily as needed for allergies (sinus issues.).   HAIR VITAMINS PO Take 1 capsule by mouth in the morning and at bedtime. Nutrafol   hydrochlorothiazide 12.5 MG capsule Commonly known as: MICROZIDE Take 12.5 mg by mouth every evening.   ketoconazole 2 % shampoo Commonly known as: NIZORAL Apply 1 application topically 2 (two) times a week.   levothyroxine 100 MCG tablet Commonly known as: SYNTHROID Take 100 mcg by mouth every other day. In the morning on an empty stomach   levothyroxine 88 MCG tablet Commonly known as: SYNTHROID Take 88 mcg by mouth every other day. In the morning on an empty stomach   losartan 100 MG tablet Commonly known as: COZAAR Take 100 mg by mouth every evening.   metoprolol succinate 25 MG 24 hr tablet Commonly known as: TOPROL-XL Take 25 mg by mouth in the morning.   polyethylene glycol 17 g packet Commonly known as: MIRALAX / GLYCOLAX Take 17 g by mouth 2 (two) times daily. What changed: when to take this   traMADol 50 MG tablet Commonly known as: ULTRAM Take 1 tablet (50 mg total) by mouth every 6 (six) hours as needed (pain.).   triamcinolone cream 0.1 % Commonly known as: KENALOG Apply 1 application topically daily as needed (skin irritation/rash).   vitamin C 1000 MG tablet Take 1,000 mg by mouth 2 (two) times daily.   Vitamin D (Ergocalciferol) 1.25 MG (50000 UNIT) Caps capsule Commonly known as: DRISDOL Take 50,000 Units by mouth every Friday.       Follow-up Information    Kary Kos, MD Follow up.   Specialty: Neurosurgery Contact information: [address] [place] [address] [phone]               Signed: Traci Sermon 01/01/2021, 7:14 AM

## 2021-01-03 ENCOUNTER — Encounter (HOSPITAL_COMMUNITY): Payer: Self-pay | Admitting: Neurosurgery

## 2021-01-04 ENCOUNTER — Other Ambulatory Visit (HOSPITAL_COMMUNITY): Payer: Medicare Other

## 2021-02-03 DIAGNOSIS — M5440 Lumbago with sciatica, unspecified side: Secondary | ICD-10-CM | POA: Diagnosis not present

## 2021-02-03 DIAGNOSIS — Z6836 Body mass index (BMI) 36.0-36.9, adult: Secondary | ICD-10-CM | POA: Diagnosis not present

## 2021-02-03 DIAGNOSIS — I10 Essential (primary) hypertension: Secondary | ICD-10-CM | POA: Diagnosis not present

## 2021-02-03 DIAGNOSIS — M542 Cervicalgia: Secondary | ICD-10-CM | POA: Diagnosis not present

## 2021-03-30 DIAGNOSIS — M40204 Unspecified kyphosis, thoracic region: Secondary | ICD-10-CM | POA: Diagnosis not present

## 2021-03-30 DIAGNOSIS — M47814 Spondylosis without myelopathy or radiculopathy, thoracic region: Secondary | ICD-10-CM | POA: Diagnosis not present

## 2021-03-30 DIAGNOSIS — R079 Chest pain, unspecified: Secondary | ICD-10-CM | POA: Diagnosis not present

## 2021-03-30 DIAGNOSIS — M5125 Other intervertebral disc displacement, thoracolumbar region: Secondary | ICD-10-CM | POA: Diagnosis not present

## 2021-03-30 DIAGNOSIS — M5124 Other intervertebral disc displacement, thoracic region: Secondary | ICD-10-CM | POA: Diagnosis not present

## 2021-04-05 DIAGNOSIS — M1990 Unspecified osteoarthritis, unspecified site: Secondary | ICD-10-CM | POA: Diagnosis not present

## 2021-04-05 DIAGNOSIS — E063 Autoimmune thyroiditis: Secondary | ICD-10-CM | POA: Diagnosis not present

## 2021-04-05 DIAGNOSIS — Z139 Encounter for screening, unspecified: Secondary | ICD-10-CM | POA: Diagnosis not present

## 2021-04-05 DIAGNOSIS — R21 Rash and other nonspecific skin eruption: Secondary | ICD-10-CM | POA: Diagnosis not present

## 2021-04-05 DIAGNOSIS — Z79899 Other long term (current) drug therapy: Secondary | ICD-10-CM | POA: Diagnosis not present

## 2021-04-05 DIAGNOSIS — R011 Cardiac murmur, unspecified: Secondary | ICD-10-CM | POA: Diagnosis not present

## 2021-04-05 DIAGNOSIS — E118 Type 2 diabetes mellitus with unspecified complications: Secondary | ICD-10-CM | POA: Diagnosis not present

## 2021-04-05 DIAGNOSIS — Z9181 History of falling: Secondary | ICD-10-CM | POA: Diagnosis not present

## 2021-04-05 DIAGNOSIS — Z1331 Encounter for screening for depression: Secondary | ICD-10-CM | POA: Diagnosis not present

## 2021-04-05 DIAGNOSIS — E785 Hyperlipidemia, unspecified: Secondary | ICD-10-CM | POA: Diagnosis not present

## 2021-04-05 DIAGNOSIS — E559 Vitamin D deficiency, unspecified: Secondary | ICD-10-CM | POA: Diagnosis not present

## 2021-04-05 DIAGNOSIS — Z6836 Body mass index (BMI) 36.0-36.9, adult: Secondary | ICD-10-CM | POA: Diagnosis not present

## 2021-04-21 DIAGNOSIS — M5440 Lumbago with sciatica, unspecified side: Secondary | ICD-10-CM | POA: Diagnosis not present

## 2021-05-17 DIAGNOSIS — I2510 Atherosclerotic heart disease of native coronary artery without angina pectoris: Secondary | ICD-10-CM | POA: Diagnosis not present

## 2021-05-17 DIAGNOSIS — Z713 Dietary counseling and surveillance: Secondary | ICD-10-CM | POA: Diagnosis not present

## 2021-05-17 DIAGNOSIS — Z6836 Body mass index (BMI) 36.0-36.9, adult: Secondary | ICD-10-CM | POA: Diagnosis not present

## 2021-05-17 DIAGNOSIS — I10 Essential (primary) hypertension: Secondary | ICD-10-CM | POA: Diagnosis not present

## 2021-05-17 DIAGNOSIS — E1159 Type 2 diabetes mellitus with other circulatory complications: Secondary | ICD-10-CM | POA: Diagnosis not present

## 2021-05-17 DIAGNOSIS — E039 Hypothyroidism, unspecified: Secondary | ICD-10-CM | POA: Diagnosis not present

## 2021-05-17 DIAGNOSIS — E785 Hyperlipidemia, unspecified: Secondary | ICD-10-CM | POA: Diagnosis not present

## 2021-07-21 DIAGNOSIS — I10 Essential (primary) hypertension: Secondary | ICD-10-CM | POA: Diagnosis not present

## 2021-07-21 DIAGNOSIS — M5440 Lumbago with sciatica, unspecified side: Secondary | ICD-10-CM | POA: Diagnosis not present

## 2021-07-21 DIAGNOSIS — Z6836 Body mass index (BMI) 36.0-36.9, adult: Secondary | ICD-10-CM | POA: Diagnosis not present

## 2021-07-25 DIAGNOSIS — I10 Essential (primary) hypertension: Secondary | ICD-10-CM | POA: Diagnosis not present

## 2021-07-25 DIAGNOSIS — M8580 Other specified disorders of bone density and structure, unspecified site: Secondary | ICD-10-CM | POA: Diagnosis not present

## 2021-07-25 DIAGNOSIS — E785 Hyperlipidemia, unspecified: Secondary | ICD-10-CM | POA: Diagnosis not present

## 2021-07-25 DIAGNOSIS — I2510 Atherosclerotic heart disease of native coronary artery without angina pectoris: Secondary | ICD-10-CM | POA: Diagnosis not present

## 2021-07-25 DIAGNOSIS — E1159 Type 2 diabetes mellitus with other circulatory complications: Secondary | ICD-10-CM | POA: Diagnosis not present

## 2021-08-02 ENCOUNTER — Other Ambulatory Visit: Payer: Self-pay | Admitting: Internal Medicine

## 2021-08-02 DIAGNOSIS — M8580 Other specified disorders of bone density and structure, unspecified site: Secondary | ICD-10-CM

## 2021-11-07 ENCOUNTER — Other Ambulatory Visit (HOSPITAL_COMMUNITY): Payer: Medicare Other

## 2021-11-22 DIAGNOSIS — I10 Essential (primary) hypertension: Secondary | ICD-10-CM | POA: Diagnosis not present

## 2021-11-22 DIAGNOSIS — E1159 Type 2 diabetes mellitus with other circulatory complications: Secondary | ICD-10-CM | POA: Diagnosis not present

## 2021-11-22 DIAGNOSIS — E785 Hyperlipidemia, unspecified: Secondary | ICD-10-CM | POA: Diagnosis not present

## 2021-11-22 DIAGNOSIS — M5136 Other intervertebral disc degeneration, lumbar region: Secondary | ICD-10-CM | POA: Diagnosis not present

## 2021-11-22 DIAGNOSIS — I7121 Aneurysm of the ascending aorta, without rupture: Secondary | ICD-10-CM | POA: Diagnosis not present

## 2021-11-22 DIAGNOSIS — I2510 Atherosclerotic heart disease of native coronary artery without angina pectoris: Secondary | ICD-10-CM | POA: Diagnosis not present

## 2021-11-22 DIAGNOSIS — M8580 Other specified disorders of bone density and structure, unspecified site: Secondary | ICD-10-CM | POA: Diagnosis not present

## 2021-11-22 DIAGNOSIS — I700 Atherosclerosis of aorta: Secondary | ICD-10-CM | POA: Diagnosis not present

## 2021-12-05 ENCOUNTER — Encounter: Payer: Self-pay | Admitting: Cardiology

## 2021-12-05 ENCOUNTER — Ambulatory Visit (HOSPITAL_COMMUNITY): Payer: Medicare Other | Attending: Cardiology

## 2021-12-05 DIAGNOSIS — I77810 Thoracic aortic ectasia: Secondary | ICD-10-CM

## 2021-12-05 DIAGNOSIS — I77819 Aortic ectasia, unspecified site: Secondary | ICD-10-CM | POA: Diagnosis not present

## 2021-12-05 DIAGNOSIS — I350 Nonrheumatic aortic (valve) stenosis: Secondary | ICD-10-CM

## 2021-12-05 DIAGNOSIS — E785 Hyperlipidemia, unspecified: Secondary | ICD-10-CM

## 2021-12-05 DIAGNOSIS — I700 Atherosclerosis of aorta: Secondary | ICD-10-CM

## 2021-12-05 LAB — ECHOCARDIOGRAM COMPLETE
AR max vel: 1.48 cm2
AV Area VTI: 1.7 cm2
AV Area mean vel: 1.36 cm2
AV Mean grad: 11 mmHg
AV Peak grad: 20.6 mmHg
Ao pk vel: 2.27 m/s
Area-P 1/2: 2.12 cm2
P 1/2 time: 818 msec
S' Lateral: 2.4 cm

## 2022-01-03 ENCOUNTER — Encounter: Payer: Self-pay | Admitting: Pharmacist

## 2022-01-03 ENCOUNTER — Telehealth: Payer: Self-pay | Admitting: Pharmacist

## 2022-01-03 ENCOUNTER — Ambulatory Visit (INDEPENDENT_AMBULATORY_CARE_PROVIDER_SITE_OTHER): Payer: Medicare Other | Admitting: Pharmacist

## 2022-01-03 DIAGNOSIS — E785 Hyperlipidemia, unspecified: Secondary | ICD-10-CM | POA: Diagnosis not present

## 2022-01-03 MED ORDER — REPATHA SURECLICK 140 MG/ML ~~LOC~~ SOAJ: 1.0000 "pen " | SUBCUTANEOUS | 11 refills | Status: DC

## 2022-01-03 NOTE — Telephone Encounter (Addendum)
Repatha prior authorization approved through 01/03/23. Rx sent to pharmacy. Copay $535 likely due to deductible for branded medications, will call pt to discuss. ?

## 2022-01-03 NOTE — Telephone Encounter (Signed)
Spoke with pt, enrolled her in [employer]. Info sent to her via MyChart message. She sees her PCP every 3 months who will check follow up lipids (lives an hr from clinic). ?

## 2022-01-03 NOTE — Progress Notes (Signed)
Patient ID: Erica Calhoun                 DOB: 1951/01/19                    MRN: [id number] ? ? ? ? ?HPI: ?Erica Calhoun is a 71 y.o. female patient referred to lipid clinic by Dr Radford Pax. PMH is significant for ASCVD with cardiac catheterization March 2018 showing a 10% mid LAD, distal LAD and mid left circumflex , 1-39% bilateral carotid artery stenosis on 12/2020 ultrasound, HLD, HTN, DM (A1c 6.6% 11/2021), obesity, and fibromyalgia. ? ?Pt presents today for follow up. Reports she tried atorvastatin in the past about 10 years ago which caused her A1c to increase. She does not wish to retry statin therapy. Is tolerating her ezetimibe well. Also has taken OTC fish oil or krill oil - reports she changed from fish oil to krill oil between her 2 most recent lipid panels when her LDL improved. ? ?Current Medications: ezetimibe '10mg'$  daily ?Intolerances: atorvastatin - hyperglycemia ?Risk Factors: CAD, DM, HTN ?LDL goal: <70 ? ?Diet: Low carb and low fat, high protein and greens ? ?Exercise: Yard work, has DDD ? ?Family History: Bladder Cancer in her brother and father; Colon cancer in her maternal grandmother; Goiter in her mother.  ? ?Social History: Denies tobacco, drug and alcohol use. ? ?Labs: ?11/22/21: TC 253, TG 236, HDL 52, LDL 154 (ezetimibe '10mg'$  daily) ?07/25/21: TC 273, TG 102, HDL 48, LDL 205 (ezetimibe '10mg'$  daily) ? ?Past Medical History:  ?Diagnosis Date  ? Acquired dilation of ascending aorta and aortic root (HCC)   ? 46m by echo 12/2020 and 4cm by Chest CTA 12/2020   ? Aortic atherosclerosis ([place]   ? Aortic insufficiency   ? mild AI by echo 12/2021  ? Aortic stenosis   ? mild by echo 12/2021  ? Arthritis   ? cervical and lower back   ? Carotid artery stenosis   ? 1-39% bilateral carotid artery stenosis by dopplers 12/2020  ? Chest pain   ? s/p LHC 11/28/16: 10% mLAd, dLAD, mCx, EF 60%, very mild AS (peak grad 16) - cardiologist Dr. TWynonia Lawman ? Dyslipidemia   ? Fibromyalgia   ? Heart murmur   ? History of  hiatal hernia   ? History of recurrent UTIs   ? Hypertension   ? Hypothyroidism   ? Osteopenia   ? Pneumonia   ? hx/1/17  ? Sinusitis   ? Ureter obstruction   ? right side, back in 1980's    No problems since  ? ? ?Current Outpatient Medications on File Prior to Visit  ?Medication Sig Dispense Refill  ? Ascorbic Acid (VITAMIN C) 1000 MG tablet Take 1,000 mg by mouth 2 (two) times daily.    ? aspirin EC 81 MG tablet Take 1 tablet (81 mg total) by mouth at bedtime. Swallow whole. 30 tablet 11  ? carboxymethylcellulose (REFRESH PLUS) 0.5 % SOLN Apply 1-2 drops to eye 2 (two) times daily as needed (dry/irritated eyes.).    ? cyclobenzaprine (FLEXERIL) 10 MG tablet Take 1 tablet (10 mg total) by mouth 3 (three) times daily as needed for muscle spasms. 60 tablet 2  ? ezetimibe (ZETIA) 10 MG tablet Take 10 mg by mouth in the morning.    ? finasteride (PROSCAR) 5 MG tablet Take 5 mg by mouth in the morning.    ? fluticasone (FLONASE) 50 MCG/ACT nasal spray Place 1 spray into both  nostrils daily as needed for allergies (sinus issues.).    ? Glucosamine-Chondroitin (COSAMIN DS PO) Take 1 capsule by mouth in the morning and at bedtime.    ? hydrochlorothiazide (MICROZIDE) 12.5 MG capsule Take 12.5 mg by mouth every evening.    ? ketoconazole (NIZORAL) 2 % shampoo Apply 1 application topically 2 (two) times a week.    ? levothyroxine (SYNTHROID) 100 MCG tablet Take 100 mcg by mouth every other day. In the morning on an empty stomach    ? levothyroxine (SYNTHROID, LEVOTHROID) 88 MCG tablet Take 88 mcg by mouth every other day. In the morning on an empty stomach  12  ? losartan (COZAAR) 100 MG tablet Take 100 mg by mouth every evening.    ? metoprolol succinate (TOPROL-XL) 25 MG 24 hr tablet Take 25 mg by mouth in the morning.    ? Multiple Vitamins-Minerals (HAIR VITAMINS PO) Take 1 capsule by mouth in the morning and at bedtime. Nutrafol    ? polyethylene glycol (MIRALAX / GLYCOLAX) packet Take 17 g by mouth 2 (two) times  daily. (Patient taking differently: Take 17 g by mouth in the morning.) 14 each 0  ? traMADol (ULTRAM) 50 MG tablet Take 1 tablet (50 mg total) by mouth every 6 (six) hours as needed (pain.). 60 tablet 0  ? triamcinolone cream (KENALOG) 0.1 % Apply 1 application topically daily as needed (skin irritation/rash).    ? Vitamin D, Ergocalciferol, (DRISDOL) 50000 units CAPS capsule Take 50,000 Units by mouth every Friday.    ? Wheat Dextrin (BENEFIBER) POWD Take 1 Scoop by mouth in the morning. Mixed with miralax    ? ?No current facility-administered medications on file prior to visit.  ? ? ?Allergies  ?Allergen Reactions  ? Penicillins Shortness Of Breath, Swelling, Rash and Other (See Comments)  ?  Has patient had a PCN reaction causing immediate rash, facial/tongue/throat swelling, SOB or lightheadedness with hypotension:Yes ?Has patient had a PCN reaction causing severe rash involving mucus membranes or skin necrosis: No ?Has patient had a PCN reaction that required hospitalization No ?Has patient had a PCN reaction occurring within the last 10 years: No ?If all of the above answers are "NO", then may proceed with Cephalosporin use. ?  ? Adhesive [Tape] Other (See Comments)  ?  Blisters skin - MUST USE PAPER TAPE  ? Aspirin Other (See Comments)  ?  In high doses causes ears rings ?  ? Codeine Other (See Comments)  ?  headache  ? Morphine Other (See Comments)  ?  headache  ? ? ?Assessment/Plan: ? ?1. Hyperlipidemia - LDL 154 above goal < 70 due to aortic atherosclerosis, currently taking ezetimibe '10mg'$  daily. Pt experienced hyperglycemia on atorvastatin and is adamant about not rechallenging with another statin. Discussed other lipid lowering options including PCSK9i, Nexlizet, and Leqvio. Pt prefers to try PCSK9i. Discussed that insurance may not cover this without statin rechallenge, in which case will pursue Leqvio coverage instead. Will follow up with pt once coverage determination is known. ? ?[employer] E. Orhan Mayorga,  PharmD, BCACP, CPP ?[facility] [place], [place] [address] ?Phone: [phone]; Fax: [phone] ?01/03/2022 1:29 PM ? ? ? ?

## 2022-01-03 NOTE — Patient Instructions (Addendum)
Your LDL cholesterol is 154 and your goal is < 70 ? ?I will submit information to your insurance for Repatha or Praluent and let you know when I hear back.  ?  ?They are subcutaneous injections given once every 2 weeks in the fatty tissue of your stomach or upper outer thigh. Store the medication in the fridge. You can let your dose warm up to room temperature for 30 minutes before injecting if you prefer. They lower your LDL cholesterol by 60% and helps to lower your chance of having a heart attack or stroke. ? ?I'll call you when I hear back from your insurance. If they do not cover this medication, I will look into coverage for Leqvio. This is an injection given at [facility] at month 0, 3, then every 6 months. It lowers your LDL cholesterol by 50% ? ?

## 2022-01-13 ENCOUNTER — Other Ambulatory Visit: Payer: Medicare Other

## 2022-02-17 ENCOUNTER — Ambulatory Visit
Admission: RE | Admit: 2022-02-17 | Discharge: 2022-02-17 | Disposition: A | Discharge status: Home / Self Care | Payer: Medicare Other | Source: Ambulatory Visit | Attending: Internal Medicine | Admitting: Internal Medicine

## 2022-02-17 DIAGNOSIS — M85832 Other specified disorders of bone density and structure, left forearm: Secondary | ICD-10-CM | POA: Diagnosis not present

## 2022-02-17 DIAGNOSIS — Z780 Asymptomatic menopausal state: Secondary | ICD-10-CM | POA: Diagnosis not present

## 2022-02-17 DIAGNOSIS — M8580 Other specified disorders of bone density and structure, unspecified site: Secondary | ICD-10-CM

## 2022-04-05 DIAGNOSIS — Z1231 Encounter for screening mammogram for malignant neoplasm of breast: Secondary | ICD-10-CM | POA: Diagnosis not present

## 2022-04-05 DIAGNOSIS — Z01419 Encounter for gynecological examination (general) (routine) without abnormal findings: Secondary | ICD-10-CM | POA: Diagnosis not present

## 2022-05-05 DIAGNOSIS — E039 Hypothyroidism, unspecified: Secondary | ICD-10-CM | POA: Diagnosis not present

## 2022-05-05 DIAGNOSIS — I10 Essential (primary) hypertension: Secondary | ICD-10-CM | POA: Diagnosis not present

## 2022-05-05 DIAGNOSIS — W5503XA Scratched by cat, initial encounter: Secondary | ICD-10-CM | POA: Diagnosis not present

## 2022-05-05 DIAGNOSIS — E785 Hyperlipidemia, unspecified: Secondary | ICD-10-CM | POA: Diagnosis not present

## 2022-05-05 DIAGNOSIS — Z0000 Encounter for general adult medical examination without abnormal findings: Secondary | ICD-10-CM | POA: Diagnosis not present

## 2022-05-05 DIAGNOSIS — S61419A Laceration without foreign body of unspecified hand, initial encounter: Secondary | ICD-10-CM | POA: Diagnosis not present

## 2022-05-05 DIAGNOSIS — I700 Atherosclerosis of aorta: Secondary | ICD-10-CM | POA: Diagnosis not present

## 2022-05-05 DIAGNOSIS — I7121 Aneurysm of the ascending aorta, without rupture: Secondary | ICD-10-CM | POA: Diagnosis not present

## 2022-05-05 DIAGNOSIS — M8580 Other specified disorders of bone density and structure, unspecified site: Secondary | ICD-10-CM | POA: Diagnosis not present

## 2022-05-05 DIAGNOSIS — M5136 Other intervertebral disc degeneration, lumbar region: Secondary | ICD-10-CM | POA: Diagnosis not present

## 2022-05-05 DIAGNOSIS — I2510 Atherosclerotic heart disease of native coronary artery without angina pectoris: Secondary | ICD-10-CM | POA: Diagnosis not present

## 2022-05-05 DIAGNOSIS — E1159 Type 2 diabetes mellitus with other circulatory complications: Secondary | ICD-10-CM | POA: Diagnosis not present

## 2022-08-15 DIAGNOSIS — R7989 Other specified abnormal findings of blood chemistry: Secondary | ICD-10-CM | POA: Diagnosis not present

## 2022-08-15 DIAGNOSIS — I350 Nonrheumatic aortic (valve) stenosis: Secondary | ICD-10-CM | POA: Diagnosis not present

## 2022-08-15 DIAGNOSIS — Z86010 Personal history of colonic polyps: Secondary | ICD-10-CM | POA: Diagnosis not present

## 2022-08-15 DIAGNOSIS — E785 Hyperlipidemia, unspecified: Secondary | ICD-10-CM | POA: Diagnosis not present

## 2022-08-15 DIAGNOSIS — E039 Hypothyroidism, unspecified: Secondary | ICD-10-CM | POA: Diagnosis not present

## 2022-08-15 DIAGNOSIS — I6523 Occlusion and stenosis of bilateral carotid arteries: Secondary | ICD-10-CM | POA: Diagnosis not present

## 2022-08-15 DIAGNOSIS — E1159 Type 2 diabetes mellitus with other circulatory complications: Secondary | ICD-10-CM | POA: Diagnosis not present

## 2022-08-15 DIAGNOSIS — I10 Essential (primary) hypertension: Secondary | ICD-10-CM | POA: Diagnosis not present

## 2022-12-14 DIAGNOSIS — I350 Nonrheumatic aortic (valve) stenosis: Secondary | ICD-10-CM | POA: Diagnosis not present

## 2022-12-14 DIAGNOSIS — I10 Essential (primary) hypertension: Secondary | ICD-10-CM | POA: Diagnosis not present

## 2022-12-14 DIAGNOSIS — I7121 Aneurysm of the ascending aorta, without rupture: Secondary | ICD-10-CM | POA: Diagnosis not present

## 2022-12-14 DIAGNOSIS — R490 Dysphonia: Secondary | ICD-10-CM | POA: Diagnosis not present

## 2022-12-14 DIAGNOSIS — M5136 Other intervertebral disc degeneration, lumbar region: Secondary | ICD-10-CM | POA: Diagnosis not present

## 2022-12-14 DIAGNOSIS — E1159 Type 2 diabetes mellitus with other circulatory complications: Secondary | ICD-10-CM | POA: Diagnosis not present

## 2022-12-14 DIAGNOSIS — I700 Atherosclerosis of aorta: Secondary | ICD-10-CM | POA: Diagnosis not present

## 2022-12-14 DIAGNOSIS — I2510 Atherosclerotic heart disease of native coronary artery without angina pectoris: Secondary | ICD-10-CM | POA: Diagnosis not present

## 2022-12-14 DIAGNOSIS — E785 Hyperlipidemia, unspecified: Secondary | ICD-10-CM | POA: Diagnosis not present

## 2022-12-14 DIAGNOSIS — I6523 Occlusion and stenosis of bilateral carotid arteries: Secondary | ICD-10-CM | POA: Diagnosis not present

## 2022-12-15 ENCOUNTER — Other Ambulatory Visit: Payer: Self-pay | Admitting: Internal Medicine

## 2022-12-15 DIAGNOSIS — R490 Dysphonia: Secondary | ICD-10-CM

## 2023-01-08 ENCOUNTER — Ambulatory Visit
Admission: RE | Admit: 2023-01-08 | Discharge: 2023-01-08 | Disposition: A | Discharge status: Home / Self Care | Payer: Medicare Other | Source: Ambulatory Visit | Attending: Internal Medicine | Admitting: Internal Medicine

## 2023-01-08 DIAGNOSIS — E049 Nontoxic goiter, unspecified: Secondary | ICD-10-CM | POA: Diagnosis not present

## 2023-01-08 DIAGNOSIS — R490 Dysphonia: Secondary | ICD-10-CM

## 2023-05-15 DIAGNOSIS — I700 Atherosclerosis of aorta: Secondary | ICD-10-CM | POA: Diagnosis not present

## 2023-05-15 DIAGNOSIS — Z1339 Encounter for screening examination for other mental health and behavioral disorders: Secondary | ICD-10-CM | POA: Diagnosis not present

## 2023-05-15 DIAGNOSIS — E1159 Type 2 diabetes mellitus with other circulatory complications: Secondary | ICD-10-CM | POA: Diagnosis not present

## 2023-05-15 DIAGNOSIS — E785 Hyperlipidemia, unspecified: Secondary | ICD-10-CM | POA: Diagnosis not present

## 2023-05-15 DIAGNOSIS — M8589 Other specified disorders of bone density and structure, multiple sites: Secondary | ICD-10-CM | POA: Diagnosis not present

## 2023-05-15 DIAGNOSIS — M8580 Other specified disorders of bone density and structure, unspecified site: Secondary | ICD-10-CM | POA: Diagnosis not present

## 2023-05-15 DIAGNOSIS — I7121 Aneurysm of the ascending aorta, without rupture: Secondary | ICD-10-CM | POA: Diagnosis not present

## 2023-05-15 DIAGNOSIS — I10 Essential (primary) hypertension: Secondary | ICD-10-CM | POA: Diagnosis not present

## 2023-05-15 DIAGNOSIS — I2510 Atherosclerotic heart disease of native coronary artery without angina pectoris: Secondary | ICD-10-CM | POA: Diagnosis not present

## 2023-05-15 DIAGNOSIS — E039 Hypothyroidism, unspecified: Secondary | ICD-10-CM | POA: Diagnosis not present

## 2023-05-15 DIAGNOSIS — Z1331 Encounter for screening for depression: Secondary | ICD-10-CM | POA: Diagnosis not present

## 2023-05-15 DIAGNOSIS — M5136 Other intervertebral disc degeneration, lumbar region: Secondary | ICD-10-CM | POA: Diagnosis not present

## 2023-05-15 DIAGNOSIS — Z0000 Encounter for general adult medical examination without abnormal findings: Secondary | ICD-10-CM | POA: Diagnosis not present

## 2023-05-15 DIAGNOSIS — Z6836 Body mass index (BMI) 36.0-36.9, adult: Secondary | ICD-10-CM | POA: Diagnosis not present

## 2023-05-15 LAB — LAB REPORT - SCANNED
A1c: 6.9
Albumin, Urine POC: 5.5
Albumin/Creatinine Ratio, Urine, POC: 12
Creatinine, POC: 45.5 mg/dL
EGFR (Non-African Amer.): 82.5

## 2023-05-21 ENCOUNTER — Telehealth: Payer: Self-pay

## 2023-05-21 NOTE — Telephone Encounter (Signed)
-----   Message from Erica Calhoun sent at 05/17/2023  1:43 PM EDT ----- Patient is LDL is extremely high.  Please get her in with lipid clinic ----- Message ----- From: Welton Flakes D Sent: 05/17/2023  11:43 AM EDT To: Quintella Reichert, MD

## 2023-05-21 NOTE — Telephone Encounter (Signed)
Call to patient to advise LDL is very high and Dr. Mayford Knife would like to refer to lipid clinic. No answer, left detailed message per DPR, asked patient to call back to further discuss lipid clinic referral.

## 2023-05-23 DIAGNOSIS — Z1231 Encounter for screening mammogram for malignant neoplasm of breast: Secondary | ICD-10-CM | POA: Diagnosis not present

## 2023-06-25 ENCOUNTER — Telehealth: Payer: Self-pay

## 2023-06-25 DIAGNOSIS — E785 Hyperlipidemia, unspecified: Secondary | ICD-10-CM

## 2023-06-25 NOTE — Telephone Encounter (Signed)
-----   Message from Armanda Magic sent at 05/17/2023  1:43 PM EDT ----- Patient is LDL is extremely high.  Please get her in with lipid clinic ----- Message ----- From: Welton Flakes D Sent: 05/17/2023  11:43 AM EDT To: Quintella Reichert, MD

## 2023-06-25 NOTE — Telephone Encounter (Signed)
Call to patient to explain that her LDL was very high and Dr. Mayford Knife would like her to get in with lipid clinic. Patient states she had stopped taking her repatha prior to labs in September because "it wasn't working" but agrees to lipid clinic referral if she can do virtual visits. Referral placed.

## 2023-07-13 ENCOUNTER — Other Ambulatory Visit (HOSPITAL_COMMUNITY): Payer: Self-pay

## 2023-07-13 ENCOUNTER — Telehealth: Payer: Self-pay | Admitting: Pharmacist

## 2023-07-13 ENCOUNTER — Encounter: Payer: Self-pay | Admitting: Student

## 2023-07-13 ENCOUNTER — Telehealth: Payer: Self-pay | Admitting: Pharmacy Technician

## 2023-07-13 ENCOUNTER — Ambulatory Visit: Payer: Medicare Other | Attending: Nurse Practitioner | Admitting: Student

## 2023-07-13 DIAGNOSIS — E785 Hyperlipidemia, unspecified: Secondary | ICD-10-CM

## 2023-07-13 MED ORDER — EZETIMIBE 10 MG PO TABS: 10.0000 mg | ORAL_TABLET | Freq: Every morning | ORAL | 3 refills | Status: AC

## 2023-07-13 NOTE — Progress Notes (Signed)
Patient ID: Erica Calhoun                 DOB: 1950-12-23                    MRN: [id number]     HPI: Erica Calhoun is a 72 y.o. female patient referred to lipid clinic by Dr Mayford Knife. PMH is significant for ASCVD with cardiac catheterization March 2018 showing a 10% mid LAD, distal LAD and mid left circumflex, 1-39% bilateral carotid artery stenosis on 12/2020 ultrasound, HLD, HTN, DM (A1c 6.9% 05/15/23), obesity, and fibromyalgia.  Pt was last seen in lipid clinic in May 2023 via telephone appt. Reported she tried atorvastatin in the past about 10 years ago which caused her A1c to increase. She does not wish to retry statin therapy. Is tolerating her ezetimibe well. Also has taken OTC fish oil or krill oil - reports she changed from fish oil to krill oil between her 2 most recent lipid panels when her LDL improved. Pt opted to start Repatha 140 mg subcutaneous every 2 weeks - PA approved through May 2024, enrolled in the [facility] through May 2024. Pt does not live nearby, so previously did not want to pursue Leqvio - although she does have a medicare supplement insurance plan.   Pt was seen by PCP on 05/15/23. Appears that last fill of Repatha was 07/28/22. She reported that she stopped taking her Repatha because it "was very expensive and not working." She also reported that she had stopped taking some of her blood pressure medications. Her A1c was elevated to 6.9%. PCP attempted to start Ozempic, but pt was above PAP limit and medication was cost-prohibitive. Most recent LDL-C from 05/15/23 elevated to 247 mg/dL, ApoB 425 mg/dL.   Today patient reports doing well. She prefaces conversation by reiterating that she is not willing to retrial a statin due to previous intolerances and concerns listed below. Reported that she tolerated Repatha well, did not miss doses. She did not reach out when her [employer] grant ended, so the medication then became costly. She discontinued both her Repatha and  ezetimibe when her LDL-C increased between Aug 2023 and Dec 2023 from 136 mg/dL to 956 mg/dL. She would prefer Repatha vs Leqvio, as she does not want to drive to an infusion center for her doses. Discussed option of Nexlizet as well, though pt preferred to return to her prior regimen. She denies missed doses of Repatha or issues with injecting the medication.   Current Medications: none Intolerances: atorvastatin - hyperglycemia. Another primary concern w/statin therapy is family history (liver cancer after taking statin) - not willing to retrial. Risk Factors: CAD, DM, HTN LDL goal: <70 mg/dL ApoB goal: <38 mg/dL  Diet: Low carb and low fat, high protein and greens  Exercise: Yard work, has DDD  Family History: : Father: deceased, bladder CA, CAD, diagnosed with Essential hypertension, Heart disease, Type 2 diabetes mellitus without complication, unspecified whether long term insulin use. Mother: deceased, CAD, COPD, diagnosed with Essential hypertension, Heart disease, Type 2 diabetes mellitus without complication, unspecified whether long term insulin use, Thyroid disease. 3 son(s) , 1 daughter(s) - healthy.   Social History: Denies tobacco, drug and alcohol use.  Labs: 05/15/23 (fasting): TC 326, TG 121, HDL 55, LDL 247, ApoB 183. (no medications) 08/15/22: TC 236, TG 139, HDL 48, LDL 160 (ezetimibe 10 mg daily, Repatha) 04/28/22: TC 211, TG 144, HDL 46, LDL 136 (ezetimibe 10 mg daily, Repatha)  11/22/21: TC 253, TG 236, HDL 52, LDL 154 (ezetimibe 10mg  daily) 07/25/21: TC 273, TG 102, HDL 48, LDL 205 (ezetimibe 10mg  daily)  Past Medical History:  Diagnosis Date   Acquired dilation of ascending aorta and aortic root (HCC)    43mm by echo 12/2020 and 4cm by Chest CTA 12/2020    Aortic atherosclerosis (HCC)    Aortic insufficiency    mild AI by echo 12/2021   Aortic stenosis    mild by echo 12/2021   Arthritis    cervical and lower back    Carotid artery stenosis    1-39% bilateral  carotid artery stenosis by dopplers 12/2020   Chest pain    s/p LHC 11/28/16: 10% mLAd, dLAD, mCx, EF 60%, very mild AS (peak grad 16) - cardiologist Dr. Donnie Aho   Dyslipidemia    Fibromyalgia    Heart murmur    History of hiatal hernia    History of recurrent UTIs    Hypertension    Hypothyroidism    Osteopenia    Pneumonia    hx/1/17   Sinusitis    Ureter obstruction    right side, back in 1980's    No problems since    Current Outpatient Medications on File Prior to Visit  Medication Sig Dispense Refill   amLODipine (NORVASC) 5 MG tablet Take 5 mg by mouth daily.     Ascorbic Acid (VITAMIN C) 1000 MG tablet Take 1,000 mg by mouth 2 (two) times daily.     aspirin EC 81 MG tablet Take 1 tablet (81 mg total) by mouth at bedtime. Swallow whole. 30 tablet 11   cyclobenzaprine (FLEXERIL) 10 MG tablet Take 1 tablet (10 mg total) by mouth 3 (three) times daily as needed for muscle spasms. 60 tablet 2   Glucosamine-Chondroitin (COSAMIN DS PO) Take 1 capsule by mouth in the morning and at bedtime.     hydrochlorothiazide (MICROZIDE) 12.5 MG capsule Take 12.5 mg by mouth every evening.     irbesartan (AVAPRO) 300 MG tablet Take 300 mg by mouth daily.     levothyroxine (SYNTHROID) 100 MCG tablet Take 100 mcg by mouth every other day. In the morning on an empty stomach     metoprolol succinate (TOPROL-XL) 25 MG 24 hr tablet Take 25 mg by mouth in the morning.     traMADol (ULTRAM) 50 MG tablet Take 1 tablet (50 mg total) by mouth every 6 (six) hours as needed (pain.). 60 tablet 0   Vitamin D, Ergocalciferol, (DRISDOL) 50000 units CAPS capsule Take 50,000 Units by mouth every Friday.     carboxymethylcellulose (REFRESH PLUS) 0.5 % SOLN Apply 1-2 drops to eye 2 (two) times daily as needed (dry/irritated eyes.).     Evolocumab (REPATHA SURECLICK) 140 MG/ML SOAJ Inject 1 pen. into the skin every 14 (fourteen) days. (Patient not taking: Reported on 07/13/2023) 2 mL 11   fluticasone (FLONASE) 50 MCG/ACT  nasal spray Place 1 spray into both nostrils daily as needed for allergies (sinus issues.).     ketoconazole (NIZORAL) 2 % shampoo Apply 1 application topically 2 (two) times a week.     levothyroxine (SYNTHROID, LEVOTHROID) 88 MCG tablet Take 88 mcg by mouth every other day. In the morning on an empty stomach  12   Multiple Vitamins-Minerals (HAIR VITAMINS PO) Take 1 capsule by mouth in the morning and at bedtime. Nutrafol     polyethylene glycol (MIRALAX / GLYCOLAX) packet Take 17 g by mouth 2 (two) times daily. (  Patient taking differently: Take 17 g by mouth in the morning.) 14 each 0   triamcinolone cream (KENALOG) 0.1 % Apply 1 application topically daily as needed (skin irritation/rash).     Wheat Dextrin (BENEFIBER) POWD Take 1 Scoop by mouth in the morning. Mixed with miralax     No current facility-administered medications on file prior to visit.    Allergies  Allergen Reactions   Penicillins Shortness Of Breath, Swelling, Rash and Other (See Comments)    Has patient had a PCN reaction causing immediate rash, facial/tongue/throat swelling, SOB or lightheadedness with hypotension:Yes Has patient had a PCN reaction causing severe rash involving mucus membranes or skin necrosis: No Has patient had a PCN reaction that required hospitalization No Has patient had a PCN reaction occurring within the last 10 years: No If all of the above answers are "NO", then may proceed with Cephalosporin use.    Adhesive [Tape] Other (See Comments)    Blisters skin - MUST USE PAPER TAPE   Aspirin Other (See Comments)    In high doses causes ears rings    Codeine Other (See Comments)    headache   Morphine Other (See Comments)    headache    Assessment/Plan:  Hyperlipidemia LDL goal <70 Assessment - LDL-C of 247 mg/dL elevated above goal <11 mg/dL due to aortic atherosclerosis, T2DM, family hx of CAD.  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 Upon review of recent labs, appears that LDL-C did  trend from 154 mg/dL prior to Repatha, down to 136 mg/dL on Repatha, but then increased to 160 mg/dL - at which point she discontinued the medication. It is possible that she does not respond to Repatha or PCSK9 inhibition due to a genetic defect - however, with LDL-C elevation >200 mg/dL after discontinuation, feel that it is appropriate for patient to resume along with ezetimibe. Statin would be first line treatment but pt is unwilling to consider initiation. - Pt is eligible for [employer]. Should consider addition of bempedoic acid (Nexletol) if LDL-C remains above goal at follow-up, or if pt does not respond to PCSK9i. - Pt to have repeat lab work with PCP (Dr. Nadene Rubins) in Dec 2024. Would prefer pt to delay lipid panel until January appt with Dr. Mayford Knife.   Plan - START Repatha (evolocumab) 140 mg subcutaneous every 2 weeks - START ezetimibe 10 mg PO daily - Repeat lipid panel at follow-up with Dr. Mayford Knife in Jan 2025   Total time spent over the phone for this visit was 25-30 min  Nils Pyle, PharmD PGY1 Pharmacy Resident  Carmela Hurt, Pharm.D [facility] A Division of [facility] [address], [place], [place] [address]  Phone: [phone]; Fax: [phone]

## 2023-07-13 NOTE — Telephone Encounter (Signed)
Pharmacy Patient Advocate Encounter   Received notification from Pt Calls Messages that prior authorization for repatha is required/requested.   Insurance verification completed.   The patient is insured through [facility] .   Per test claim: PA required; PA submitted to above mentioned insurance via CoverMyMeds Key/confirmation #/EOC [id number] Status is pending

## 2023-07-13 NOTE — Assessment & Plan Note (Addendum)
Assessment - LDL-C of 247 mg/dL elevated above goal <40 mg/dL due to aortic atherosclerosis, T2DM, family hx of CAD.  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 Upon review of recent labs, appears that LDL-C did trend from 154 mg/dL prior to Repatha, down to 136 mg/dL on Repatha, but then increased to 160 mg/dL - at which point she discontinued the medication. It is possible that she does not respond to Repatha or PCSK9 inhibition due to a genetic defect - however, with LDL-C elevation >200 mg/dL after discontinuation, feel that it is appropriate for patient to resume along with ezetimibe. Statin would be first line treatment but pt is unwilling to consider initiation. - Pt is eligible for [employer]. Should consider addition of bempedoic acid (Nexletol) if LDL-C remains above goal at follow-up, or if pt does not respond to PCSK9i. - Pt to have repeat lab work with PCP (Dr. Nadene Rubins) in Dec 2024. Would prefer pt to delay lipid panel until January appt with Dr. Mayford Knife.   Plan - START Repatha (evolocumab) 140 mg subcutaneous every 2 weeks - START ezetimibe 10 mg PO daily - Repeat lipid panel at follow-up with Dr. Mayford Knife in Jan 2025

## 2023-07-13 NOTE — Telephone Encounter (Signed)
Requested for PA to be initiated for Repatha. Previously approved through 01/03/23.   Nils Pyle, PharmD PGY1 Pharmacy Resident

## 2023-07-16 DIAGNOSIS — E785 Hyperlipidemia, unspecified: Secondary | ICD-10-CM | POA: Diagnosis not present

## 2023-07-16 NOTE — Addendum Note (Signed)
Addended by: Tylene Fantasia on: 07/16/2023 10:17 AM   Modules accepted: Orders

## 2023-07-16 NOTE — Telephone Encounter (Signed)
Spoke to pt, since she did not responded well was on Repatha and ezetimibe when her LDL-C increased between Aug 2023 and Dec 2023 rom 136 mg/dL to 409 mg/dL. Went off of Repatha due to cost.Most recent LDL-C from 05/15/23 elevated to 247 mg/dL, ApoB 811 mg/dL.   Patients may have mutations in PCSK9 or LDLR, or they may have anti-antibodies that cause abnormal effects from the PCSK9 inhibitor. Pt is in agreement to try Nexletol. Will get uric acid lab today and send prescription if lab come back WNL.

## 2023-07-16 NOTE — Telephone Encounter (Signed)
Pharmacy Patient Advocate Encounter  Received notification from [facility] that Prior Authorization for repatha has been DENIED.  See denial reason below. No denial letter attached in CMM. Will attach denial letter to Media tab once received.   PA #/Case ID/Reference #: [id number]

## 2023-07-17 LAB — URIC ACID: Uric Acid: 6.1 mg/dL (ref 3.1–7.9)

## 2023-07-17 MED ORDER — NEXLETOL 180 MG PO TABS: 180.0000 mg | ORAL_TABLET | Freq: Every day | ORAL | 11 refills | Status: DC

## 2023-07-17 NOTE — Addendum Note (Signed)
Addended by: Tylene Fantasia on: 07/17/2023 04:52 PM   Modules accepted: Orders

## 2023-07-18 ENCOUNTER — Telehealth: Payer: Self-pay | Admitting: Pharmacy Technician

## 2023-07-18 ENCOUNTER — Other Ambulatory Visit (HOSPITAL_COMMUNITY): Payer: Self-pay

## 2023-07-18 NOTE — Telephone Encounter (Signed)
Pharmacy Patient Advocate Encounter  Received notification from [facility] that Prior Authorization for nexletol has been APPROVED from 07/18/23 to 09/03/2098. Ran test claim, Copay is $360.70- one month- DEDUCTIBLE. This test claim was processed through [facility]- copay amounts may vary at other pharmacies due to pharmacy/plan contracts, or as the patient moves through the different stages of their insurance plan.   PA #/Case ID/Reference #: [id number]

## 2023-07-18 NOTE — Telephone Encounter (Signed)
Patient enrolled in [facility] grant   [id number]  BIN [id number]  PCN [id number]  PC Group [id number]  Info shared with the pharmacy and patient

## 2023-07-18 NOTE — Telephone Encounter (Signed)
Pharmacy Patient Advocate Encounter   Received notification from Pt Calls Messages that prior authorization for nexletol is required/requested.   Insurance verification completed.   The patient is insured through [facility] .   Per test claim: PA required; PA submitted to above mentioned insurance via CoverMyMeds Key/confirmation #/EOC [id number] Status is pending

## 2023-07-23 NOTE — Telephone Encounter (Signed)
Patient called stating [employer] grant was not working. Called pharmacy. They had old ID number. New number given. Cost now $0.

## 2023-08-21 DIAGNOSIS — E1159 Type 2 diabetes mellitus with other circulatory complications: Secondary | ICD-10-CM | POA: Diagnosis not present

## 2023-08-21 DIAGNOSIS — E785 Hyperlipidemia, unspecified: Secondary | ICD-10-CM | POA: Diagnosis not present

## 2023-08-21 DIAGNOSIS — I7121 Aneurysm of the ascending aorta, without rupture: Secondary | ICD-10-CM | POA: Diagnosis not present

## 2023-08-21 DIAGNOSIS — I700 Atherosclerosis of aorta: Secondary | ICD-10-CM | POA: Diagnosis not present

## 2023-08-21 DIAGNOSIS — I10 Essential (primary) hypertension: Secondary | ICD-10-CM | POA: Diagnosis not present

## 2023-08-21 DIAGNOSIS — I2510 Atherosclerotic heart disease of native coronary artery without angina pectoris: Secondary | ICD-10-CM | POA: Diagnosis not present

## 2023-08-21 DIAGNOSIS — Z6836 Body mass index (BMI) 36.0-36.9, adult: Secondary | ICD-10-CM | POA: Diagnosis not present

## 2023-08-21 DIAGNOSIS — Z713 Dietary counseling and surveillance: Secondary | ICD-10-CM | POA: Diagnosis not present

## 2023-08-22 ENCOUNTER — Telehealth: Payer: Self-pay | Admitting: Pharmacist

## 2023-08-22 DIAGNOSIS — E785 Hyperlipidemia, unspecified: Secondary | ICD-10-CM

## 2023-08-22 NOTE — Telephone Encounter (Signed)
Patient is due for f/u uric acid lab. Will get done either today or tomorrow.

## 2023-08-23 ENCOUNTER — Other Ambulatory Visit: Payer: Self-pay | Admitting: Pharmacist

## 2023-08-23 DIAGNOSIS — E785 Hyperlipidemia, unspecified: Secondary | ICD-10-CM

## 2023-08-23 LAB — LIPID PANEL
Chol/HDL Ratio: 4.1 {ratio} (ref 0.0–4.4)
Cholesterol, Total: 199 mg/dL (ref 100–199)
HDL: 48 mg/dL (ref >39)
LDL Chol Calc (NIH): 127 mg/dL — ABNORMAL HIGH (ref 0–99)
Triglycerides: 137 mg/dL (ref 0–149)
VLDL Cholesterol Cal: 24 mg/dL (ref 5–40)

## 2023-08-24 ENCOUNTER — Telehealth: Payer: Self-pay

## 2023-08-24 DIAGNOSIS — E785 Hyperlipidemia, unspecified: Secondary | ICD-10-CM | POA: Diagnosis not present

## 2023-08-24 NOTE — Telephone Encounter (Signed)
Call to patient to advise lipids not at goal, Dr. Mayford Knife recommends referral to lipid clinic. Patient verbalizes understanding and agrees to plan. Referral placed.

## 2023-08-24 NOTE — Telephone Encounter (Signed)
-----   Message from Armanda Magic sent at 08/23/2023  6:23 AM EST ----- Lipids still not at goal.  Please forward to lipid clinic for further recommendations.

## 2023-08-25 LAB — URIC ACID: Uric Acid: 6.7 mg/dL (ref 3.1–7.9)

## 2023-08-27 NOTE — Telephone Encounter (Signed)
Call to discuss follow up lipid lab. Patient refuse to retry statin and she does not want to try Praluent, as she did not respond to Repatha in the past. She states she just wanted to be on nexletol and Zetia for now and she will be seeing Dr.Turner in Jan she will discuss with her.

## 2023-09-10 ENCOUNTER — Encounter: Payer: Self-pay | Admitting: Cardiology

## 2023-09-10 ENCOUNTER — Ambulatory Visit: Payer: Medicare Other | Attending: Cardiology | Admitting: Cardiology

## 2023-09-10 VITALS — BP 134/72 | HR 64 | Ht 63.0 in | Wt 204.2 lb

## 2023-09-10 DIAGNOSIS — I350 Nonrheumatic aortic (valve) stenosis: Secondary | ICD-10-CM | POA: Diagnosis not present

## 2023-09-10 DIAGNOSIS — I2510 Atherosclerotic heart disease of native coronary artery without angina pectoris: Secondary | ICD-10-CM | POA: Diagnosis not present

## 2023-09-10 DIAGNOSIS — I351 Nonrheumatic aortic (valve) insufficiency: Secondary | ICD-10-CM | POA: Insufficient documentation

## 2023-09-10 DIAGNOSIS — E785 Hyperlipidemia, unspecified: Secondary | ICD-10-CM | POA: Diagnosis not present

## 2023-09-10 DIAGNOSIS — I6523 Occlusion and stenosis of bilateral carotid arteries: Secondary | ICD-10-CM | POA: Insufficient documentation

## 2023-09-10 DIAGNOSIS — I77810 Thoracic aortic ectasia: Secondary | ICD-10-CM | POA: Diagnosis not present

## 2023-09-10 DIAGNOSIS — I2583 Coronary atherosclerosis due to lipid rich plaque: Secondary | ICD-10-CM | POA: Insufficient documentation

## 2023-09-10 DIAGNOSIS — I10 Essential (primary) hypertension: Secondary | ICD-10-CM | POA: Insufficient documentation

## 2023-09-10 NOTE — Patient Instructions (Signed)
 Medication Instructions:  Your physician recommends that you continue on your current medications as directed. Please refer to the Current Medication list given to you today.  *If you need a refill on your cardiac medications before your next appointment, please call your pharmacy*   Lab Work: None.   If you have labs (blood work) drawn today and your tests are completely normal, you will receive your results only by: MyChart Message (if you have MyChart) OR A paper copy in the mail If you have any lab test that is abnormal or we need to change your treatment, we will call you to review the results.   Testing/Procedures: Your physician has requested that you have an echocardiogram. Echocardiography is a painless test that uses sound waves to create images of your heart. It provides your doctor with information about the size and shape of your heart and how well your heart's chambers and valves are working. This procedure takes approximately one hour. There are no restrictions for this procedure. Please do NOT wear cologne, perfume, aftershave, or lotions (deodorant is allowed). Please arrive 15 minutes prior to your appointment time.  Please note: We ask at that you not bring children with you during ultrasound (echo/ vascular) testing. Due to room size and safety concerns, children are not allowed in the ultrasound rooms during exams. Our front office staff cannot provide observation of children in our lobby area while testing is being conducted. An adult accompanying a patient to their appointment will only be allowed in the ultrasound room at the discretion of the ultrasound technician under special circumstances. We apologize for any inconvenience.  Your physician has requested that you have a carotid duplex. This test is an ultrasound of the carotid arteries in your neck. It looks at blood flow through these arteries that supply the brain with blood. Allow one hour for this exam. There are  no restrictions or special instructions.    Follow-Up: At [facility], you and your health needs are our priority.  As part of our continuing mission to provide you with exceptional heart care, we have created designated Provider Care Teams.  These Care Teams include your primary Cardiologist (physician) and Advanced Practice Providers (APPs -  Physician Assistants and Nurse Practitioners) who all work together to provide you with the care you need, when you need it.  We recommend signing up for the patient portal called "MyChart".  Sign up information is provided on this After Visit Summary.  MyChart is used to connect with patients for Virtual Visits (Telemedicine).  Patients are able to view lab/test results, encounter notes, upcoming appointments, etc.  Non-urgent messages can be sent to your provider as well.   To learn more about what you can do with MyChart, go to [email or web address].    Your next appointment:   1 year(s)  Provider:   Armanda Magic, MD

## 2023-09-10 NOTE — Progress Notes (Signed)
 Cardiology Office Note    Date:  09/10/2023   ID:  Erica Calhoun, DOB 1951-03-24, MRN [id number]  PCP:  Abran Ival ORN, FNP  Cardiologist:  Wilbert Bihari, MD   Chief Complaint  Patient presents with   Coronary Artery Disease   Hyperlipidemia   Hypertension    History of Present Illness:  Erica Calhoun is a 73 y.o. female with a history of a ASCAD with cardiac catheterization March 2018 showing a 10% mid LAD, distal LAD and mid left circumflex with normal LV function with EF 60% and very mild aortic stenosis.  She has a history of hyperlipidemia, fibromyalgia, hypertension.  She was on Nexletol , Repatha , Zetia  but apparently her   She is here today for followup and is doing well.  She denies any chest pain or pressure, SOB, DOE, PND, orthopnea, LE edema, dizziness, palpitations or syncope. She says that when she gets up in the am she occasionally will feel like a wet dishrag with some SOB but then goes back to bed and feels fine. This has only occurred 2-3 times in the past 2-3 years. She is compliant with her meds and is tolerating meds with no SE.    Past Medical History:  Diagnosis Date   Acquired dilation of ascending aorta and aortic root (HCC)    43mm by echo 12/2020 and 4cm by Chest CTA 12/2020    Aortic atherosclerosis (HCC)    Aortic insufficiency    mild AI by echo 12/2021   Aortic stenosis    mild by echo 12/2021   Arthritis    cervical and lower back    Carotid artery stenosis    1-39% bilateral carotid artery stenosis by dopplers 12/2020   Chest pain    s/p LHC 11/28/16: 10% mLAd, dLAD, mCx, EF 60%, very mild AS (peak grad 16) - cardiologist Dr. Blanca   Dyslipidemia    Fibromyalgia    Heart murmur    History of hiatal hernia    History of recurrent UTIs    Hypertension    Hypothyroidism    Osteopenia    Pneumonia    hx/1/17   Sinusitis    Ureter obstruction    right side, back in 1980's    No problems since    Past Surgical History:  Procedure  Laterality Date   ANTERIOR CERVICAL DECOMP/DISCECTOMY FUSION N/A 10/02/2016   Procedure: Anterior Cervical Diskectomy and Fusion - Cervical four- Cervical five - Cervical five-Cervical six - Cervical six -Cervical seven;  Surgeon: Arley Helling, MD;  Location: [facility] OR;  Service: Neurosurgery;  Laterality: N/A;   ANTERIOR CERVICAL DECOMP/DISCECTOMY FUSION N/A 12/31/2020   Procedure: Anterior Cervical Decompression/Discectomy Fusion Cervical three-four with removal of hardware Cervical four-five;  Surgeon: Helling Arley, MD;  Location: [facility] OR;  Service: Neurosurgery;  Laterality: N/A;   ANTERIOR LAT LUMBAR FUSION Left 11/05/2017   Procedure: LUMBAR TWO-THREE ANTERIOR LATERAL LUMBAR FUSION WITH LATERAL PLATE;  Surgeon: Helling Arley, MD;  Location: [facility] OR;  Service: Neurosurgery;  Laterality: Left;   APPENDECTOMY  1978   BUNIONECTOMY Bilateral    CARDIAC CATHETERIZATION     2018 with Dr. Blanca   cyst on ovary     left ovary.SABRA GRIMES N/A 01/23/2018   Procedure: CYSTOSCOPY;  Surgeon: Estelle Service, MD;  Location: [facility] ORS;  Service: Gynecology;  Laterality: N/A;   JOINT REPLACEMENT     bilateral total knees Dr. Ernie 06-20-18   LAPAROSCOPIC VAGINAL HYSTERECTOMY WITH SALPINGO OOPHORECTOMY Bilateral 01/23/2018  Procedure: LAPAROSCOPIC ASSISTED VAGINAL HYSTERECTOMY WITH SALPINGO OOPHORECTOMY;  Surgeon: Estelle Service, MD;  Location: [facility] ORS;  Service: Gynecology;  Laterality: Bilateral;   LEFT HEART CATH AND CORONARY ANGIOGRAPHY N/A 11/28/2016   Procedure: Left Heart Cath and Coronary Angiography;  Surgeon: Debby DELENA Sor, MD;  Location: [facility] INVASIVE CV LAB;  Service: Cardiovascular;  Laterality: N/A;   TOTAL KNEE ARTHROPLASTY Bilateral 06/20/2018   Procedure: TOTAL KNEE BILATERAL;  Surgeon: Ernie Cough, MD;  Location: [facility] ORS;  Service: Orthopedics;  Laterality: Bilateral;  2.5 hrs   TUBAL LIGATION     wisdon teeth      Current Medications: Current Meds  Medication Sig   amLODipine (NORVASC) 5 MG tablet  Take 5 mg by mouth daily.   Ascorbic Acid  (VITAMIN C) 1000 MG tablet Take 1,000 mg by mouth 2 (two) times daily.   aspirin  EC 81 MG tablet Take 1 tablet (81 mg total) by mouth at bedtime. Swallow whole.   Bempedoic Acid  (NEXLETOL ) 180 MG TABS Take 1 tablet (180 mg total) by mouth daily.   carboxymethylcellulose (REFRESH PLUS) 0.5 % SOLN Apply 1-2 drops to eye 2 (two) times daily as needed (dry/irritated eyes.).   cyclobenzaprine  (FLEXERIL ) 10 MG tablet Take 1 tablet (10 mg total) by mouth 3 (three) times daily as needed for muscle spasms.   ezetimibe  (ZETIA ) 10 MG tablet Take 1 tablet (10 mg total) by mouth in the morning.   fluticasone  (FLONASE ) 50 MCG/ACT nasal spray Place 1 spray into both nostrils daily as needed for allergies (sinus issues.).   Glucosamine-Chondroitin (COSAMIN DS PO) Take 1 capsule by mouth in the morning and at bedtime.   hydrochlorothiazide  (MICROZIDE ) 12.5 MG capsule Take 12.5 mg by mouth every evening.   irbesartan (AVAPRO) 300 MG tablet Take 300 mg by mouth daily.   levothyroxine  (SYNTHROID ) 100 MCG tablet Take 100 mcg by mouth every other day. In the morning on an empty stomach   metoprolol  succinate (TOPROL -XL) 25 MG 24 hr tablet Take 25 mg by mouth in the morning.   Multiple Vitamins-Minerals (HAIR VITAMINS PO) Take 1 capsule by mouth in the morning and at bedtime. Nutrafol   polyethylene glycol (MIRALAX  / GLYCOLAX ) packet Take 17 g by mouth 2 (two) times daily.   traMADol  (ULTRAM ) 50 MG tablet Take 1 tablet (50 mg total) by mouth every 6 (six) hours as needed (pain.).   triamcinolone  cream (KENALOG ) 0.1 % Apply 1 application topically daily as needed (skin irritation/rash).   Vitamin D , Ergocalciferol , (DRISDOL ) 50000 units CAPS capsule Take 50,000 Units by mouth every Friday.   Wheat Dextrin (BENEFIBER) POWD Take 1 Scoop by mouth in the morning. Mixed with miralax     Allergies:   Penicillins, Adhesive [tape], Aspirin , Codeine, and Morphine   Social History    Socioeconomic History   Marital status: Widowed    Spouse name: Not on file   Number of children: Not on file   Years of education: Not on file   Highest education level: Not on file  Occupational History   Not on file  Tobacco Use   Smoking status: Never   Smokeless tobacco: Never  Vaping Use   Vaping status: Never Used  Substance and Sexual Activity   Alcohol  use: No    Comment: quit 49's age   Drug use: No   Sexual activity: Yes  Other Topics Concern   Not on file  Social History Narrative   Not on file   Social Drivers of Health   Financial  Resource Strain: Not on file  Food Insecurity: Not on file  Transportation Needs: Not on file  Physical Activity: Not on file  Stress: Not on file  Social Connections: Not on file     Family History:  The patient's family history includes Bladder Cancer in her brother and father; Colon cancer in her maternal grandmother; Goiter in her mother.   ROS:   Please see the history of present illness.    ROS All other systems reviewed and are negative.      No data to display           PHYSICAL EXAM:   VS:  BP 134/72   Pulse 64   Ht 5' 3 (1.6 m)   Wt 204 lb 3.2 oz (92.6 kg)   SpO2 97%   BMI 36.17 kg/m    GEN: Well nourished, well developed in no acute distress HEENT: Normal NECK: No JVD; No carotid bruits LYMPHATICS: No lymphadenopathy CARDIAC:RRR, no rubs, gallops 2/6 SM at RUSB>>LLSB RESPIRATORY:  Clear to auscultation without rales, wheezing or rhonchi  ABDOMEN: Soft, non-tender, non-distended MUSCULOSKELETAL:  No edema; No deformity  SKIN: Warm and dry NEUROLOGIC:  Alert and oriented x 3 PSYCHIATRIC:  Normal affect  Wt Readings from Last 3 Encounters:  09/10/23 204 lb 3.2 oz (92.6 kg)  12/31/20 200 lb (90.7 kg)  12/28/20 203 lb 8 oz (92.3 kg)      Studies/Labs Reviewed:   EKG Interpretation Date/Time:  Monday September 10 2023 13:25:57 EST Ventricular Rate:  64 PR Interval:  172 QRS  Duration:  116 QT Interval:  430 QTC Calculation: 443 R Axis:   2  Text Interpretation: Normal sinus rhythm Incomplete right bundle branch block Cannot rule out Inferior infarct (cited on or before 26-Sep-2016) Possible Anterior infarct , age undetermined When compared with ECG of 28-Nov-2016 06:16, No significant change was found Confirmed by Shlomo Corning ([id number]) on 09/10/2023 1:32:08 PM    2D echo 2019 Study Conclusions   - Left ventricle: The cavity size was normal. There was mild focal    basal hypertrophy of the septum. Systolic function was normal.    The estimated ejection fraction was in the range of 60% to 65%.    Wall motion was normal; there were no regional wall motion    abnormalities. Doppler parameters are consistent with abnormal    left ventricular relaxation (grade 1 diastolic dysfunction).    Doppler parameters are consistent with high ventricular filling    pressure.  - Aortic valve: Valve mobility was restricted. There was moderate    stenosis. There was mild regurgitation. Peak velocity (S): 306    cm/s. Mean gradient (S): 23 mm Hg.  - Aorta: Ascending aortic diameter: 41 mm (S).  - Ascending aorta: The ascending aorta was mildly dilated.  - Mitral valve: Calcified annulus. There was mild regurgitation.  - Right ventricle: The cavity size was normal. Wall thickness was    normal.  - Tricuspid valve: There was mild regurgitation.  - Pulmonic valve: There was trivial regurgitation.  Recent Labs: No results found for requested labs within last 365 days.   Lipid Panel    Component Value Date/Time   CHOL 199 08/22/2023 1412   TRIG 137 08/22/2023 1412   HDL 48 08/22/2023 1412   CHOLHDL 4.1 08/22/2023 1412   LDLCALC 127 (H) 08/22/2023 1412    Additional studies/ records that were reviewed today include:  Office notes from Ortho office   ASSESSMENT:    1.  Coronary artery disease due to lipid rich plaque   2. Nonrheumatic aortic valve stenosis   3.  Nonrheumatic aortic valve insufficiency   4. Benign essential HTN   5. Hyperlipidemia LDL goal <70   6. Acquired dilation of ascending aorta and aortic root (HCC)   7. Bilateral carotid artery stenosis       PLAN:  In order of problems listed above:  Nonobstructive CAD  -cardiac cath in 2018 showed minimal nonobstructive CAD with 10% mid LAD, RCA and left circumflex.  LV function was normal. -Continue aspirin  81 mg daily, Toprol -XL 25 mg daily, bempedoic acid  180 mg daily, Zetia  10 mg daily, Repatha  with as needed refills  Aortic stenosis Aortic insufficiency -minimal aortic stenosis noted the time of cath in 2018 with peak aortic valve gradient 16 mmHg.  -repeat echo 2019 with moderate AS with mean AVG -repeat echo 2023 with EF 60 to 65% with mild aortic insufficiency and mild AS with mean aortic valve gradient 11 mmHg -Repeat 2D echo  HTN  -BP controlled on exam -Continue prescription drug managed with amlodipine 5 mg daily, HCTZ 12.5 mg daily, Avapro 300 mg daily, Toprol -XL 25 mg daily with as needed refills -I will get a copy of her last labs from her PCP  Hyperlipidemia  -LDL goal < 70 -I have personally reviewed and interpreted outside labs performed by patient's PCP which showed LDL 127 and HDL 48 on 08/22/2023 ALT 24 on 05/15/2023 -She is supposed to be on Zetia  10 mg daily, Repatha  and Nexlitol180 mg daily but stopped the Repatha  because her lipids did not respond -I have recommended that she try Praluent >>she is seeing the PharmD 10/11/23  Dilated ascending aorta -41mm by echo 2019 -39 mm by echo 2023 -BP is well controlled -Repeat 2D echo  Carotid artery bruits Bilateral carotid artery stenosis -Carotid artery Dopplers 2022 showed 1 to 39% bilateral carotid artery stenosis -Repeat Dopplers  Followup with me in 1 year  Medication Adjustments/Labs and Tests Ordered: Current medicines are reviewed at length with the patient today.  Concerns regarding  medicines are outlined above.  Medication changes, Labs and Tests ordered today are listed in the Patient Instructions below.  There are no Patient Instructions on file for this visit.   Signed, Wilbert Bihari, MD  09/10/2023 1:34 PM    [facility] [address] [place], [place], [place]  [address] Phone: [phone]; Fax: [phone]

## 2023-09-10 NOTE — Addendum Note (Signed)
 Addended by: Luellen Pucker on: 09/10/2023 01:45 PM   Modules accepted: Orders

## 2023-09-14 ENCOUNTER — Ambulatory Visit: Payer: Medicare Other | Admitting: Cardiology

## 2023-10-01 ENCOUNTER — Telehealth: Payer: Self-pay | Admitting: Cardiology

## 2023-10-01 ENCOUNTER — Other Ambulatory Visit: Payer: Self-pay

## 2023-10-01 MED ORDER — NEXLETOL 180 MG PO TABS: 180.0000 mg | ORAL_TABLET | Freq: Every day | ORAL | 3 refills | Status: DC

## 2023-10-01 NOTE — Telephone Encounter (Signed)
*  STAT* If patient is at the pharmacy, call can be transferred to refill team.   1. Which medications need to be refilled? (please list name of each medication and dose if known)   Bempedoic Acid (NEXLETOL) 180 MG TABS   2. Would you like to learn more about the convenience, safety, & potential cost savings by using the [facility]?   3. Are you open to using the [facility] (Type [facility]. ).   4. Which pharmacy/location (including street and city if local pharmacy) is medication to be sent to?  [facility] - [place], [place] - [address]   5. Do they need a 30 day or 90 day supply?   90 day  Caller Mikle Bosworth) stated patient's refill prescription needs to go to [facility].  Caller stated patient still has medication left.

## 2023-10-11 ENCOUNTER — Telehealth: Payer: Self-pay | Admitting: Pharmacist

## 2023-10-11 ENCOUNTER — Other Ambulatory Visit (HOSPITAL_COMMUNITY): Payer: Self-pay

## 2023-10-11 ENCOUNTER — Ambulatory Visit: Payer: Medicare Other | Attending: Cardiology | Admitting: Student

## 2023-10-11 ENCOUNTER — Telehealth: Payer: Self-pay | Admitting: Pharmacy Technician

## 2023-10-11 DIAGNOSIS — E785 Hyperlipidemia, unspecified: Secondary | ICD-10-CM

## 2023-10-11 NOTE — Telephone Encounter (Signed)
 Praluent  PA requested

## 2023-10-11 NOTE — Progress Notes (Signed)
 Patient ID: Erica Calhoun                 DOB: November 14, 1950                    MRN: [id number]    This was telephone visit  HPI: Erica Calhoun is a 73 y.o. female patient referred to lipid clinic by Dr Shlomo. PMH is significant for ASCVD with cardiac catheterization March 2018 showing a 10% mid LAD, distal LAD and mid left circumflex, 1-39% bilateral carotid artery stenosis on 12/2020 ultrasound, HLD, HTN, DM (A1c 6.9% 05/15/23), obesity, and fibromyalgia.  Pt was last seen in lipid clinic in May 2023 via telephone appt. Reported she tried atorvastatin in the past about 10 years ago which caused her A1c to increase. She does not wish to retry statin therapy. Is tolerating her ezetimibe  well. Also has taken OTC fish oil or krill oil - reports she changed from fish oil to krill oil between her 2 most recent lipid panels when her LDL improved. Pt opted to start Repatha  140 mg subcutaneous every 2 weeks - PA approved through May 2024, enrolled in the [facility] through May 2024. Pt does not live nearby, so previously did not want to pursue Leqvio - although she does have a medicare supplement insurance plan.   Pt was seen by PCP on 05/15/23. Appears that last fill of Repatha  was 07/28/22. She reported that she stopped taking her Repatha  because it was very expensive and not working. She also reported that she had stopped taking some of her blood pressure medications. Her A1c was elevated to 6.9%. PCP attempted to start Ozempic, but pt was above PAP limit and medication was cost-prohibitive. Most recent LDL-C from 05/15/23 elevated to 247 mg/dL, ApoB 816 mg/dL. Phone visit in Nov 2024 with Pharm D patient reported she was compliant with Repatha  but her LDLc went up from 136 to 160 between aug to Dec 2023.  Today patient reports she has been tolerating Nexletol  and Zetia  well. She would like to get updated baseline Lab which she could get done tomorrow. As Repatha  non -responder she may not responses to  Praluent  but willing to try.   Current Medications: Nexletol  180 mg daily and Zetia  10 mg daily  Intolerances: atorvastatin - hyperglycemia. Another primary concern w/statin therapy is family history (liver cancer after taking statin) - not willing to retrial. Risk Factors: CAD, DM, HTN LDL goal: <70 mg/dL ApoB goal: <19 mg/dL  Diet: Low carb and low fat, high protein and greens  Exercise: not much due to chronic back pain   Family History: : Father: deceased, bladder CA, CAD, diagnosed with Essential hypertension, Heart disease, Type 2 diabetes mellitus without complication, unspecified whether long term insulin use. Mother: deceased, CAD, COPD, diagnosed with Essential hypertension, Heart disease, Type 2 diabetes mellitus without complication, unspecified whether long term insulin use, Thyroid  disease. 3 son(s) , 1 daughter(s) - healthy.   Social History: Denies tobacco, drug and alcohol  use.  Labs: 08/22/2023 fasting TC 199, TG 137, HDL 48, LDLc 127 ( 1 month of Nexlizet 180 mg daily with Zetia  10 mg daily) 05/15/23 (fasting): TC 326, TG 121, HDL 55, LDL 247, ApoB 183. (no medications) 08/15/22: TC 236, TG 139, HDL 48, LDL 160 (ezetimibe  10 mg daily, Repatha ) 04/28/22: TC 211, TG 144, HDL 46, LDL 136 (ezetimibe  10 mg daily, Repatha ) 11/22/21: TC 253, TG 236, HDL 52, LDL 154 (ezetimibe  10mg  daily) 07/25/21: TC 273, TG  102, HDL 48, LDL 205 (ezetimibe  10mg  daily)  Past Medical History:  Diagnosis Date   Acquired dilation of ascending aorta and aortic root (HCC)    43mm by echo 12/2020 and 4cm by Chest CTA 12/2020    Aortic atherosclerosis (HCC)    Aortic insufficiency    mild AI by echo 12/2021   Aortic stenosis    mild by echo 12/2021   Arthritis    cervical and lower back    Carotid artery stenosis    1-39% bilateral carotid artery stenosis by dopplers 12/2020   Chest pain    s/p LHC 11/28/16: 10% mLAd, dLAD, mCx, EF 60%, very mild AS (peak grad 16) - cardiologist Dr. Blanca    Dyslipidemia    Fibromyalgia    Heart murmur    History of hiatal hernia    History of recurrent UTIs    Hypertension    Hypothyroidism    Osteopenia    Pneumonia    hx/1/17   Sinusitis    Ureter obstruction    right side, back in 1980's    No problems since    Current Outpatient Medications on File Prior to Visit  Medication Sig Dispense Refill   amLODipine (NORVASC) 5 MG tablet Take 5 mg by mouth daily.     Ascorbic Acid  (VITAMIN C) 1000 MG tablet Take 1,000 mg by mouth 2 (two) times daily.     aspirin  EC 81 MG tablet Take 1 tablet (81 mg total) by mouth at bedtime. Swallow whole. 30 tablet 11   Bempedoic Acid  (NEXLETOL ) 180 MG TABS Take 1 tablet (180 mg total) by mouth daily. 90 tablet 3   carboxymethylcellulose (REFRESH PLUS) 0.5 % SOLN Apply 1-2 drops to eye 2 (two) times daily as needed (dry/irritated eyes.).     cyclobenzaprine  (FLEXERIL ) 10 MG tablet Take 1 tablet (10 mg total) by mouth 3 (three) times daily as needed for muscle spasms. 60 tablet 2   ezetimibe  (ZETIA ) 10 MG tablet Take 1 tablet (10 mg total) by mouth in the morning. 90 tablet 3   fluticasone  (FLONASE ) 50 MCG/ACT nasal spray Place 1 spray into both nostrils daily as needed for allergies (sinus issues.).     Glucosamine-Chondroitin (COSAMIN DS PO) Take 1 capsule by mouth in the morning and at bedtime.     hydrochlorothiazide  (MICROZIDE ) 12.5 MG capsule Take 12.5 mg by mouth every evening.     irbesartan (AVAPRO) 300 MG tablet Take 300 mg by mouth daily.     levothyroxine  (SYNTHROID ) 100 MCG tablet Take 100 mcg by mouth every other day. In the morning on an empty stomach     metoprolol  succinate (TOPROL -XL) 25 MG 24 hr tablet Take 25 mg by mouth in the morning.     Multiple Vitamins-Minerals (HAIR VITAMINS PO) Take 1 capsule by mouth in the morning and at bedtime. Nutrafol     polyethylene glycol (MIRALAX  / GLYCOLAX ) packet Take 17 g by mouth 2 (two) times daily. 14 each 0   traMADol  (ULTRAM ) 50 MG tablet Take 1  tablet (50 mg total) by mouth every 6 (six) hours as needed (pain.). 60 tablet 0   triamcinolone  cream (KENALOG ) 0.1 % Apply 1 application topically daily as needed (skin irritation/rash).     Vitamin D , Ergocalciferol , (DRISDOL ) 50000 units CAPS capsule Take 50,000 Units by mouth every Friday.     Wheat Dextrin (BENEFIBER) POWD Take 1 Scoop by mouth in the morning. Mixed with miralax      No current facility-administered  medications on file prior to visit.    Allergies  Allergen Reactions   Penicillins Shortness Of Breath, Swelling, Rash and Other (See Comments)    Has patient had a PCN reaction causing immediate rash, facial/tongue/throat swelling, SOB or lightheadedness with hypotension:Yes Has patient had a PCN reaction causing severe rash involving mucus membranes or skin necrosis: No Has patient had a PCN reaction that required hospitalization No Has patient had a PCN reaction occurring within the last 10 years: No If all of the above answers are NO, then may proceed with Cephalosporin use.    Adhesive [Tape] Other (See Comments)    Blisters skin - MUST USE PAPER TAPE   Aspirin  Other (See Comments)    In high doses causes ears rings    Codeine Other (See Comments)    headache   Morphine Other (See Comments)    headache    Assessment/Plan:  Hyperlipidemia LDL goal <70 Assessment:  LDL goal: <70 mg/dl last LDLc  872 mg/dl (87/7975)typoz on Nexletol  180 mg and Zetia  10 mg daily  Tolerates current medications well without side effects  Did not responded well on Repatha  - compliant with Repatha  but her LDLc went up from 136 to 160 between Aug to Dec 2023. Follows heart healthy diet and unable to do exercise due to chronic back pain   Plan: Continue taking current medications (Nexletol  180 mg daily and Zetia  10 mg daily ) Will apply for PA for Praluent  150 mg Q14 days  Lipid lab due in 2-3 months after starting PCSK9i    Total time spent over the phone for this visit was  25-30 min    Robbi Blanch, [place].D [facility] A Division of [facility] [address], [place], [place] [address]  Phone: [phone]; Fax: [phone]

## 2023-10-11 NOTE — Telephone Encounter (Signed)
 Pharmacy Patient Advocate Encounter   Received notification from Pt Calls Messages that prior authorization for Praluent  75MG /ML auto-injectors is required/requested.   Insurance verification completed.   The patient is insured through [facility] .   Per test claim: PA required; PA submitted to above mentioned insurance via CoverMyMeds Key/confirmation #/EOC [id number] Status is pending

## 2023-10-11 NOTE — Assessment & Plan Note (Signed)
 Assessment:  LDL goal: <70 mg/dl last LDLc  872 mg/dl (87/7975)typoz on Nexletol  180 mg and Zetia  10 mg daily  Tolerates current medications well without side effects  Did not responded well on Repatha  - compliant with Repatha  but her LDLc went up from 136 to 160 between Aug to Dec 2023. Follows heart healthy diet and unable to do exercise due to chronic back pain   Plan: Continue taking current medications (Nexletol  180 mg daily and Zetia  10 mg daily ) Will apply for PA for Praluent  150 mg Q14 days  Lipid lab due in 2-3 months after starting PCSK9i

## 2023-10-12 ENCOUNTER — Ambulatory Visit (HOSPITAL_BASED_OUTPATIENT_CLINIC_OR_DEPARTMENT_OTHER)
Admission: RE | Admit: 2023-10-12 | Discharge: 2023-10-12 | Disposition: A | Discharge status: Home / Self Care | Payer: Medicare Other | Source: Ambulatory Visit | Attending: Cardiology | Admitting: Cardiology

## 2023-10-12 ENCOUNTER — Ambulatory Visit (HOSPITAL_COMMUNITY)
Admission: RE | Admit: 2023-10-12 | Discharge: 2023-10-12 | Disposition: A | Discharge status: Home / Self Care | Payer: Medicare Other | Source: Ambulatory Visit | Attending: Cardiology | Admitting: Cardiology

## 2023-10-12 ENCOUNTER — Other Ambulatory Visit (HOSPITAL_COMMUNITY): Payer: Self-pay

## 2023-10-12 DIAGNOSIS — I350 Nonrheumatic aortic (valve) stenosis: Secondary | ICD-10-CM | POA: Diagnosis not present

## 2023-10-12 DIAGNOSIS — I6523 Occlusion and stenosis of bilateral carotid arteries: Secondary | ICD-10-CM | POA: Insufficient documentation

## 2023-10-12 DIAGNOSIS — E785 Hyperlipidemia, unspecified: Secondary | ICD-10-CM | POA: Diagnosis not present

## 2023-10-12 LAB — ECHOCARDIOGRAM COMPLETE
AR max vel: 1.44 cm2
AV Area VTI: 1.64 cm2
AV Area mean vel: 1.5 cm2
AV Mean grad: 9.7 mmHg
AV Peak grad: 18.4 mmHg
AV Vena cont: 0.23 cm
Ao pk vel: 2.14 m/s
Area-P 1/2: 3.34 cm2
MV M vel: 1.26 m/s
MV Peak grad: 6.4 mmHg
P 1/2 time: 428 ms
S' Lateral: 2.45 cm

## 2023-10-12 MED ORDER — PRALUENT 150 MG/ML ~~LOC~~ SOAJ: 150.0000 mg | SUBCUTANEOUS | 2 refills | Status: DC

## 2023-10-12 NOTE — Telephone Encounter (Signed)
 Patient made aware of approval, will be starting Praluent  150 mg from next week, F/U lab due on April 23,2024 - ordered place and patient is aware to go for f/u lab on April 23.

## 2023-10-12 NOTE — Telephone Encounter (Signed)
 Pharmacy Patient Advocate Encounter  Received notification from [facility] that Prior Authorization for Praluent  75MG /ML auto-injectors  has been APPROVED from 09/27/23 to until further notice. Ran test claim, Copay is $244.06. This test claim was processed through [facility]- copay amounts may vary at other pharmacies due to pharmacy/plan contracts, or as the patient moves through the different stages of their insurance plan.   PA #/Case ID/Reference #: [id number]

## 2023-10-12 NOTE — Addendum Note (Signed)
 Addended by: Donell Sliwinski K on: 10/12/2023 04:44 PM   Modules accepted: Orders

## 2023-10-13 LAB — LIPID PANEL
Chol/HDL Ratio: 4.2 {ratio} (ref 0.0–4.4)
Cholesterol, Total: 211 mg/dL — ABNORMAL HIGH (ref 100–199)
HDL: 50 mg/dL (ref >39)
LDL Chol Calc (NIH): 137 mg/dL — ABNORMAL HIGH (ref 0–99)
Triglycerides: 135 mg/dL (ref 0–149)
VLDL Cholesterol Cal: 24 mg/dL (ref 5–40)

## 2023-10-14 ENCOUNTER — Encounter: Payer: Self-pay | Admitting: Cardiology

## 2023-10-14 DIAGNOSIS — I050 Rheumatic mitral stenosis: Secondary | ICD-10-CM | POA: Insufficient documentation

## 2023-10-15 ENCOUNTER — Telehealth: Payer: Self-pay

## 2023-10-15 DIAGNOSIS — I350 Nonrheumatic aortic (valve) stenosis: Secondary | ICD-10-CM

## 2023-10-15 DIAGNOSIS — I050 Rheumatic mitral stenosis: Secondary | ICD-10-CM

## 2023-10-15 NOTE — Telephone Encounter (Signed)
-----   Message from Gaylyn Keas sent at 10/14/2023  7:51 PM EST ----- 1-39% right and 40-59% left carotid artery stenosis by dopplers 10/2023

## 2023-10-15 NOTE — Telephone Encounter (Signed)
 Call to patient to discuss results of echo, carotid us , and lipid panel.   Discussed progression of carotid disease and Dr. Charl Concha recommendation that Pharm D escalate management of lipids. Patient also states she is aware of elevated LDL and has not missed any doses of her medications. She states she has already been contacted by Pharm D, who has changed her repatha  to Praluent .   Discussed Echo resutls, patient verbalizes understanding that  Echo showed normal heart function EF 65-70% and increased stiffness of heart muscle called diastolic dysfunction.  There is calcification of the mitral valve with moderate mitral stenosis and aortic stenosis and mild to moderately leaky AV. Mildly enlarged ascending aorta.       Patient agrees to repeat Echo in 1 year.

## 2023-12-11 DIAGNOSIS — E041 Nontoxic single thyroid nodule: Secondary | ICD-10-CM | POA: Diagnosis not present

## 2023-12-11 DIAGNOSIS — M51361 Other intervertebral disc degeneration, lumbar region with lower extremity pain only: Secondary | ICD-10-CM | POA: Diagnosis not present

## 2023-12-11 DIAGNOSIS — E1159 Type 2 diabetes mellitus with other circulatory complications: Secondary | ICD-10-CM | POA: Diagnosis not present

## 2023-12-11 DIAGNOSIS — I10 Essential (primary) hypertension: Secondary | ICD-10-CM | POA: Diagnosis not present

## 2023-12-11 DIAGNOSIS — Z6836 Body mass index (BMI) 36.0-36.9, adult: Secondary | ICD-10-CM | POA: Diagnosis not present

## 2023-12-11 DIAGNOSIS — E785 Hyperlipidemia, unspecified: Secondary | ICD-10-CM | POA: Diagnosis not present

## 2023-12-11 DIAGNOSIS — I2510 Atherosclerotic heart disease of native coronary artery without angina pectoris: Secondary | ICD-10-CM | POA: Diagnosis not present

## 2023-12-11 DIAGNOSIS — I700 Atherosclerosis of aorta: Secondary | ICD-10-CM | POA: Diagnosis not present

## 2023-12-11 DIAGNOSIS — I7121 Aneurysm of the ascending aorta, without rupture: Secondary | ICD-10-CM | POA: Diagnosis not present

## 2023-12-12 ENCOUNTER — Other Ambulatory Visit: Payer: Self-pay | Admitting: Internal Medicine

## 2023-12-12 DIAGNOSIS — E041 Nontoxic single thyroid nodule: Secondary | ICD-10-CM

## 2023-12-18 ENCOUNTER — Ambulatory Visit
Admission: RE | Admit: 2023-12-18 | Discharge: 2023-12-18 | Disposition: A | Discharge status: Home / Self Care | Source: Ambulatory Visit | Attending: Internal Medicine

## 2023-12-18 DIAGNOSIS — E042 Nontoxic multinodular goiter: Secondary | ICD-10-CM | POA: Diagnosis not present

## 2023-12-18 DIAGNOSIS — E041 Nontoxic single thyroid nodule: Secondary | ICD-10-CM

## 2023-12-27 ENCOUNTER — Encounter: Payer: Self-pay | Admitting: Cardiology

## 2023-12-28 NOTE — Telephone Encounter (Signed)
 Spoke to pt, will be going for lab on Monday December 31, 2023 [facility] [place] location.

## 2023-12-31 ENCOUNTER — Other Ambulatory Visit: Payer: Self-pay | Admitting: Cardiology

## 2023-12-31 DIAGNOSIS — E785 Hyperlipidemia, unspecified: Secondary | ICD-10-CM

## 2024-01-01 ENCOUNTER — Other Ambulatory Visit: Payer: Self-pay | Admitting: Internal Medicine

## 2024-01-01 DIAGNOSIS — E041 Nontoxic single thyroid nodule: Secondary | ICD-10-CM

## 2024-01-01 LAB — LIPID PANEL
Chol/HDL Ratio: 2.6 ratio (ref 0.0–4.4)
Cholesterol, Total: 128 mg/dL (ref 100–199)
HDL: 50 mg/dL (ref >39)
LDL Chol Calc (NIH): 55 mg/dL (ref 0–99)
Triglycerides: 130 mg/dL (ref 0–149)
VLDL Cholesterol Cal: 23 mg/dL (ref 5–40)

## 2024-01-30 ENCOUNTER — Other Ambulatory Visit (HOSPITAL_COMMUNITY)
Admission: RE | Admit: 2024-01-30 | Discharge: 2024-01-30 | Disposition: A | Discharge status: Home / Self Care | Source: Ambulatory Visit | Attending: Internal Medicine | Admitting: Internal Medicine

## 2024-01-30 ENCOUNTER — Ambulatory Visit
Admission: RE | Admit: 2024-01-30 | Discharge: 2024-01-30 | Disposition: A | Discharge status: Home / Self Care | Source: Ambulatory Visit | Attending: Internal Medicine | Admitting: Internal Medicine

## 2024-01-30 DIAGNOSIS — E041 Nontoxic single thyroid nodule: Secondary | ICD-10-CM

## 2024-02-01 LAB — CYTOLOGY - NON PAP

## 2024-05-16 DIAGNOSIS — Z0000 Encounter for general adult medical examination without abnormal findings: Secondary | ICD-10-CM | POA: Diagnosis not present

## 2024-05-16 DIAGNOSIS — E785 Hyperlipidemia, unspecified: Secondary | ICD-10-CM | POA: Diagnosis not present

## 2024-05-16 DIAGNOSIS — E039 Hypothyroidism, unspecified: Secondary | ICD-10-CM | POA: Diagnosis not present

## 2024-05-16 DIAGNOSIS — E1159 Type 2 diabetes mellitus with other circulatory complications: Secondary | ICD-10-CM | POA: Diagnosis not present

## 2024-05-22 DIAGNOSIS — Z6836 Body mass index (BMI) 36.0-36.9, adult: Secondary | ICD-10-CM | POA: Diagnosis not present

## 2024-05-22 DIAGNOSIS — I7121 Aneurysm of the ascending aorta, without rupture: Secondary | ICD-10-CM | POA: Diagnosis not present

## 2024-05-22 DIAGNOSIS — E1159 Type 2 diabetes mellitus with other circulatory complications: Secondary | ICD-10-CM | POA: Diagnosis not present

## 2024-05-22 DIAGNOSIS — Z0000 Encounter for general adult medical examination without abnormal findings: Secondary | ICD-10-CM | POA: Diagnosis not present

## 2024-05-22 DIAGNOSIS — M5136 Other intervertebral disc degeneration, lumbar region with discogenic back pain only: Secondary | ICD-10-CM | POA: Diagnosis not present

## 2024-05-22 DIAGNOSIS — Z1331 Encounter for screening for depression: Secondary | ICD-10-CM | POA: Diagnosis not present

## 2024-05-22 DIAGNOSIS — I6523 Occlusion and stenosis of bilateral carotid arteries: Secondary | ICD-10-CM | POA: Diagnosis not present

## 2024-05-22 DIAGNOSIS — Z1339 Encounter for screening examination for other mental health and behavioral disorders: Secondary | ICD-10-CM | POA: Diagnosis not present

## 2024-05-22 DIAGNOSIS — R0683 Snoring: Secondary | ICD-10-CM | POA: Diagnosis not present

## 2024-05-22 DIAGNOSIS — I350 Nonrheumatic aortic (valve) stenosis: Secondary | ICD-10-CM | POA: Diagnosis not present

## 2024-05-22 DIAGNOSIS — R82998 Other abnormal findings in urine: Secondary | ICD-10-CM | POA: Diagnosis not present

## 2024-05-22 DIAGNOSIS — I2510 Atherosclerotic heart disease of native coronary artery without angina pectoris: Secondary | ICD-10-CM | POA: Diagnosis not present

## 2024-05-22 DIAGNOSIS — I10 Essential (primary) hypertension: Secondary | ICD-10-CM | POA: Diagnosis not present

## 2024-05-22 DIAGNOSIS — E785 Hyperlipidemia, unspecified: Secondary | ICD-10-CM | POA: Diagnosis not present

## 2024-05-26 ENCOUNTER — Telehealth: Payer: Self-pay | Admitting: Pharmacy Technician

## 2024-05-26 ENCOUNTER — Telehealth: Payer: Self-pay | Admitting: Pharmacist

## 2024-05-26 NOTE — Telephone Encounter (Signed)
   Patient Advocate Encounter   The patient was approved for a [employer] grant that will help cover the cost of praluent  Total amount awarded, 2500.  Effective: 06/12/24 - 06/11/25   APW:[id number] ERW:[id number] Hmnle:[id number] PI:[id number] [employer] ID: [id number]   Pharmacy provided with approval and processing information. Patient informed via mychart

## 2024-05-30 NOTE — Telephone Encounter (Signed)
 HW grant renewed

## 2024-06-19 ENCOUNTER — Ambulatory Visit: Payer: Self-pay | Admitting: Neurology

## 2024-06-19 ENCOUNTER — Encounter: Payer: Self-pay | Admitting: Neurology

## 2024-06-19 VITALS — BP 135/69 | HR 80 | Ht 63.0 in | Wt 205.6 lb

## 2024-06-19 DIAGNOSIS — I350 Nonrheumatic aortic (valve) stenosis: Secondary | ICD-10-CM | POA: Diagnosis not present

## 2024-06-19 DIAGNOSIS — E669 Obesity, unspecified: Secondary | ICD-10-CM

## 2024-06-19 DIAGNOSIS — R0683 Snoring: Secondary | ICD-10-CM

## 2024-06-19 DIAGNOSIS — G4700 Insomnia, unspecified: Secondary | ICD-10-CM | POA: Diagnosis not present

## 2024-06-19 DIAGNOSIS — R351 Nocturia: Secondary | ICD-10-CM

## 2024-06-19 DIAGNOSIS — G4719 Other hypersomnia: Secondary | ICD-10-CM | POA: Diagnosis not present

## 2024-06-19 DIAGNOSIS — Z9189 Other specified personal risk factors, not elsewhere classified: Secondary | ICD-10-CM

## 2024-06-19 NOTE — Progress Notes (Deleted)
 Patient is here for sleep - fatigue, snoring has gotten worse, husband in the past to her she stopped breathing in her sleep  ESS- 19  FSS - 63

## 2024-06-19 NOTE — Patient Instructions (Signed)

## 2024-06-19 NOTE — Progress Notes (Addendum)
 Subjective:    Patient ID: DEJANIQUE RUEHL is a 73 y.o. female.  HPI    True Mar, MD, PhD [facility] [address] [address] [place], [place] [address]  Dear Dr. Stephane,  I saw your patient, Glenis Musolf, upon your kind request in my sleep clinic today for initial consultation of her sleep disorder, in particular, concern for underlying obstructive sleep apnea.  The patient is unaccompanied today.  As you know, Ms. Crunk is a 73 year old female with an underlying medical history of degenerative disc disease, arthritis with status post bilateral knee replacements, reflux disease, diabetes, hypertension, hyperlipidemia, thoracic and aortic aneurysm, aortic stenosis, carotid artery stenosis, hypothyroidism, constipation, and obesity, who reports snoring and excessive daytime somnolence.  Her Epworth sleepiness score is 19 out of 24, fatigue severity score is 63 out of 63.  Her symptoms have become worse over the past year.  She has a longstanding history of snoring.  Her husband had mentioned it to her in the past.  She is widowed and lives alone, she has 4 grown children, she has 1 elderly dog in the household and 2 cats.  She would not be able to leave the dog alone overnight as the dog has separation anxiety and seizures.  She is a restless sleeper and wakes up in the middle of the night, also has trouble going to sleep.  She sometimes takes PM type medication or generic Benadryl .  She goes to bed somewhere between 10 and midnight and rise time is between 7 and 9.  She has nocturia about twice per average night.  She denies recurrent nocturnal or morning headaches.  She is a non-smoker.  She drinks caffeine in the form of coffee and green tea, 1 serving each on average.  She does not drink any alcohol .  She is not aware of any family history of sleep apnea but her father snored.  She does not have a TV in her bedroom.  She has had weight fluctuation, currently  working on weight loss.  Her Past Medical History Is Significant For: Past Medical History:  Diagnosis Date   Acquired dilation of ascending aorta and aortic root (HCC    42mm ascending aorta by echo 10/2023   Aortic atherosclerosis    Aortic insufficiency    mild to moderate by echo 10/2023   Aortic stenosis    mild by echo 10/2023   Arthritis    cervical and lower back    Carotid artery stenosis    1-39% right and 40-59% left carotid artery stenosis by dopplers 10/2023   Chest pain    s/p LHC 11/28/16: 10% mLAd, dLAD, mCx, EF 60%, very mild AS (peak grad 16) - cardiologist Dr. Blanca   Dyslipidemia    Fibromyalgia    Heart murmur    History of hiatal hernia    History of recurrent UTIs    Hypertension    Hypothyroidism    Mitral stenosis    mild by echo 10/2023 mean MVG   Osteopenia    Pneumonia    hx/1/17   Sinusitis    Ureter obstruction    right side, back in 1980's    No problems since    Her Past Surgical History Is Significant For: Past Surgical History:  Procedure Laterality Date   ANTERIOR CERVICAL DECOMP/DISCECTOMY FUSION N/A 10/02/2016   Procedure: Anterior Cervical Diskectomy and Fusion - Cervical four- Cervical five - Cervical five-Cervical six - Cervical six -Cervical seven;  Surgeon: Arley Helling, MD;  Location: [facility] OR;  Service: Neurosurgery;  Laterality: N/A;   ANTERIOR CERVICAL DECOMP/DISCECTOMY FUSION N/A 12/31/2020   Procedure: Anterior Cervical Decompression/Discectomy Fusion Cervical three-four with removal of hardware Cervical four-five;  Surgeon: Helling Arley, MD;  Location: [facility] OR;  Service: Neurosurgery;  Laterality: N/A;   ANTERIOR LAT LUMBAR FUSION Left 11/05/2017   Procedure: LUMBAR TWO-THREE ANTERIOR LATERAL LUMBAR FUSION WITH LATERAL PLATE;  Surgeon: Helling Arley, MD;  Location: [facility] OR;  Service: Neurosurgery;  Laterality: Left;   APPENDECTOMY  1978   BUNIONECTOMY Bilateral    CARDIAC CATHETERIZATION     2018 with Dr. Blanca   cyst on ovary     left  ovary.SABRA GRIMES N/A 01/23/2018   Procedure: CYSTOSCOPY;  Surgeon: Estelle Service, MD;  Location: [facility] ORS;  Service: Gynecology;  Laterality: N/A;   JOINT REPLACEMENT     bilateral total knees Dr. Ernie 06-20-18   LAPAROSCOPIC VAGINAL HYSTERECTOMY WITH SALPINGO OOPHORECTOMY Bilateral 01/23/2018   Procedure: LAPAROSCOPIC ASSISTED VAGINAL HYSTERECTOMY WITH SALPINGO OOPHORECTOMY;  Surgeon: Estelle Service, MD;  Location: [facility] ORS;  Service: Gynecology;  Laterality: Bilateral;   LEFT HEART CATH AND CORONARY ANGIOGRAPHY N/A 11/28/2016   Procedure: Left Heart Cath and Coronary Angiography;  Surgeon: Debby DELENA Sor, MD;  Location: [facility] INVASIVE CV LAB;  Service: Cardiovascular;  Laterality: N/A;   TOTAL KNEE ARTHROPLASTY Bilateral 06/20/2018   Procedure: TOTAL KNEE BILATERAL;  Surgeon: Ernie Cough, MD;  Location: [facility] ORS;  Service: Orthopedics;  Laterality: Bilateral;  2.5 hrs   TUBAL LIGATION     wisdon teeth      Her Family History Is Significant For: Family History  Problem Relation Age of Onset   Goiter Mother    Bladder Cancer Father    Bladder Cancer Brother    Colon cancer Maternal Grandmother    Stomach cancer Neg Hx    Rectal cancer Neg Hx    Esophageal cancer Neg Hx    Pancreatic cancer Neg Hx    Sleep apnea Neg Hx     Her Social History Is Significant For: Social History   Socioeconomic History   Marital status: Widowed    Spouse name: Not on file   Number of children: Not on file   Years of education: Not on file   Highest education level: Not on file  Occupational History   Not on file  Tobacco Use   Smoking status: Never   Smokeless tobacco: Never  Vaping Use   Vaping status: Never Used  Substance and Sexual Activity   Alcohol  use: No    Comment: quit 67's age   Drug use: No   Sexual activity: Yes  Other Topics Concern   Not on file  Social History Narrative   1 cup of coffee in the morning, and 1 cup of green or black tea in the evening    Social  Drivers of Corporate investment banker Strain: Not on file  Food Insecurity: Not on file  Transportation Needs: Not on file  Physical Activity: Not on file  Stress: Not on file  Social Connections: Not on file    Her Allergies Are:  Allergies  Allergen Reactions   Penicillins Shortness Of Breath, Swelling, Rash and Other (See Comments)    Has patient had a PCN reaction causing immediate rash, facial/tongue/throat swelling, SOB or lightheadedness with hypotension:Yes Has patient had a PCN reaction causing severe rash involving mucus membranes or skin necrosis: No Has patient had a PCN  reaction that required hospitalization No Has patient had a PCN reaction occurring within the last 10 years: No If all of the above answers are NO, then may proceed with Cephalosporin use.    Adhesive [Tape] Other (See Comments)    Blisters skin - MUST USE PAPER TAPE   Other Other (See Comments)    adhesive tape   Aspirin  Other (See Comments)    In high doses causes ears rings    Codeine Other (See Comments)    headache   Morphine Other (See Comments)    headache  :   Her Current Medications Are:  Outpatient Encounter Medications as of 06/19/2024  Medication Sig   Alirocumab  (PRALUENT ) 150 MG/ML SOAJ INJECT 1 ML UNDER THE SKIN EVERY 14 DAYS   amLODipine (NORVASC) 5 MG tablet Take 5 mg by mouth daily.   Ascorbic Acid  (VITAMIN C) 1000 MG tablet Take 1,000 mg by mouth 2 (two) times daily.   aspirin  EC 81 MG tablet Take 1 tablet (81 mg total) by mouth at bedtime. Swallow whole.   Bempedoic Acid  (NEXLETOL ) 180 MG TABS Take 1 tablet (180 mg total) by mouth daily.   carboxymethylcellulose (REFRESH PLUS) 0.5 % SOLN Apply 1-2 drops to eye 2 (two) times daily as needed (dry/irritated eyes.).   cyclobenzaprine  (FLEXERIL ) 10 MG tablet Take 1 tablet (10 mg total) by mouth 3 (three) times daily as needed for muscle spasms.   ezetimibe  (ZETIA ) 10 MG tablet Take 1 tablet (10 mg total) by mouth in the  morning.   fluticasone  (FLONASE ) 50 MCG/ACT nasal spray Place 1 spray into both nostrils daily as needed for allergies (sinus issues.).   Glucosamine-Chondroitin (COSAMIN DS PO) Take 1 capsule by mouth in the morning and at bedtime.   hydrochlorothiazide  (MICROZIDE ) 12.5 MG capsule Take 12.5 mg by mouth every evening.   irbesartan (AVAPRO) 300 MG tablet Take 300 mg by mouth daily.   levothyroxine  (SYNTHROID ) 100 MCG tablet Take 100 mcg by mouth every other day. In the morning on an empty stomach   metFORMIN (GLUCOPHAGE-XR) 500 MG 24 hr tablet Take 500 mg by mouth. 2 tablets in the evening   metoprolol  succinate (TOPROL -XL) 25 MG 24 hr tablet Take 25 mg by mouth in the morning.   Multiple Vitamins-Minerals (HAIR VITAMINS PO) Take 1 capsule by mouth in the morning and at bedtime. Nutrafol   omeprazole (PRILOSEC) 20 MG capsule 1 capsule 1/2 to 1 hour before morning meal Orally Once a day   polyethylene glycol (MIRALAX  / GLYCOLAX ) packet Take 17 g by mouth 2 (two) times daily.   spironolactone (ALDACTONE) 25 MG tablet Take 25 mg by mouth daily.   traMADol  (ULTRAM ) 50 MG tablet Take 1 tablet (50 mg total) by mouth every 6 (six) hours as needed (pain.).   triamcinolone  cream (KENALOG ) 0.1 % Apply 1 application topically daily as needed (skin irritation/rash).   Vitamin D , Ergocalciferol , (DRISDOL ) 50000 units CAPS capsule Take 50,000 Units by mouth every Friday.   Wheat Dextrin (BENEFIBER) POWD Take 1 Scoop by mouth in the morning. Mixed with miralax    Zinc 50 MG TABS 1 tablet Orally Once a day   No facility-administered encounter medications on file as of 06/19/2024.  :   Review of Systems:  Out of a complete 14 point review of systems, all are reviewed and negative with the exception of these symptoms as listed below:   Review of Systems  Neurological:        Patient is here for sleep -  fatigue, snoring has gotten worse, husband in the past to her she stopped breathing in her sleep  ESS- 19   FSS - 63    Objective:  Neurological Exam  Physical Exam Physical Examination:   Vitals:   06/19/24 1347  BP: 135/69  Pulse: 80    General Examination: The patient is a very pleasant 73 y.o. female in no acute distress. She appears well-developed and well-nourished and well groomed.   HEENT: Normocephalic, atraumatic, pupils are equal, round and reactive to light, extraocular tracking is good without limitation to gaze excursion or nystagmus noted. No photophobia.  Corrective eye glasses in place. Hearing is grossly intact.  Face is symmetric with normal facial animation. Speech is clear without dysarthria. There is no hypophonia. There is no lip, neck/head, jaw or voice tremor. Neck is supple with full range of passive and active motion. There are no carotid bruits on auscultation.  Airway/Oropharynx exam reveals: mild mouth dryness, adequate dental hygiene and moderate airway crowding, due to small airway entry, tonsils on the smaller side, slightly wider uvula, Mallampati class III, neck circumference 16-1/4 inches, minimal overbite noted, slight crossbite.  Tongue protrudes centrally and palate elevates symmetrically.  Chest: Clear to auscultation without wheezing, rhonchi or crackles noted.  Heart: S1+S2+0, regular with 3/6 systolic murmur noted.   Abdomen: Soft, non-tender and non-distended.  Extremities: There is no pitting edema in the distal lower extremities bilaterally.   Skin: Warm and dry without trophic changes noted.   Musculoskeletal: exam reveals no obvious joint deformities.   Neurologically:  Mental status: The patient is awake, alert and oriented in all 4 spheres. Her immediate and remote memory, attention, language skills and fund of knowledge are appropriate. There is no evidence of aphasia, agnosia, apraxia or anomia. Speech is clear with normal prosody and enunciation. Thought process is linear. Mood is normal and affect is normal.  Cranial nerves II -  XII are as described above under HEENT exam.  Motor exam: Normal bulk, strength and tone is noted. There is no obvious action or resting tremor.  Fine motor skills and coordination: Intact grossly.  Cerebellar testing: No dysmetria or intention tremor. There is no truncal or gait ataxia.  Sensory exam: intact to light touch in the upper and lower extremities.  Gait, station and balance: She stands easily. No veering to one side is noted. No leaning to one side is noted. Posture is age-appropriate and stance is narrow based. Gait shows normal stride length and normal pace. No problems turning are noted.   Assessment and Plan:  In summary, BERMA HARTS is a very pleasant 73 y.o.-year old female with an underlying medical history of degenerative disc disease, arthritis with status post bilateral knee replacements, reflux disease, diabetes, hypertension, hyperlipidemia, thoracic and aortic aneurysm, aortic stenosis, carotid artery stenosis, hypothyroidism, constipation, and obesity, whose history and physical exam are concerning for sleep disordered breathing, particularly obstructive sleep apnea (OSA).  While a laboratory attended sleep study is typically considered gold standard for evaluation of sleep disordered breathing, we mutually agreed to proceed with a home sleep test at this time especially since she cannot leave her elderly dog home alone.  She has no one that would be able to take care of her pets.   I had a long chat with the patient about my findings and the diagnosis of sleep apnea, particularly OSA, its prognosis and treatment options. We talked about medical/conservative treatments, surgical interventions and non-pharmacological approaches for symptom control. I explained,  in particular, the risks and ramifications of untreated moderate to severe OSA, especially with respect to developing cardiovascular disease down the road, including congestive heart failure (CHF), difficult to treat  hypertension, cardiac arrhythmias (particularly A-fib), neurovascular complications including TIA, stroke and dementia. Even type 2 diabetes has, in part, been linked to untreated OSA. Symptoms of untreated OSA may include (but may not be limited to) daytime sleepiness, nocturia (i.e. frequent nighttime urination), memory problems, mood irritability and suboptimally controlled or worsening mood disorder such as depression and/or anxiety, lack of energy, lack of motivation, physical discomfort, as well as recurrent headaches, especially morning or nocturnal headaches. We talked about the importance of maintaining a healthy lifestyle and striving for healthy weight. In addition, we talked about the importance of striving for and maintaining good sleep hygiene. I recommended a sleep study at this time. I outlined the differences between a laboratory attended sleep study which is considered more comprehensive and accurate over the option of a home sleep test (HST); the latter may lead to underestimation of sleep disordered breathing in some instances and does not help with diagnosing upper airway resistance syndrome and is not accurate enough to diagnose primary central sleep apnea typically. I outlined possible surgical and non-surgical treatment options of OSA, including the use of a positive airway pressure (PAP) device (i.e. CPAP, AutoPAP/APAP or BiPAP in certain circumstances), a custom-made dental device (aka oral appliance, which would require a referral to a specialist dentist or orthodontist typically, and is generally speaking not considered for patients with full dentures or edentulous state), upper airway surgical options, such as traditional UPPP (which is not considered a first-line treatment) or the Inspire device (hypoglossal nerve stimulator, which would involve a referral for consultation with an ENT surgeon, after careful selection, following inclusion criteria - also not first-line treatment). I  explained the PAP treatment option to the patient in detail, as this is generally considered first-line treatment.  The patient indicated that she would be willing to try PAP therapy, if the need arises. I explained the importance of being compliant with PAP treatment, not only for insurance purposes but primarily to improve patient's symptoms symptoms, and for the patient's long term health benefit, including to reduce Her cardiovascular risks longer-term.    We will pick up our discussion about the next steps and treatment options after testing.  We will keep her posted as to the test results by phone call and/or MyChart messaging where possible.  We will plan to follow-up in sleep clinic accordingly as well.  I answered all her questions today and the patient was in agreement.   I encouraged her to call with any interim questions, concerns, problems or updates or email us  through MyChart.  Generally speaking, sleep test authorizations may take up to 2 weeks, sometimes less, sometimes longer, the patient is encouraged to get in touch with us  if they do not hear back from the sleep lab staff directly within the next 2 weeks.  Thank you very much for allowing me to participate in the care of this nice patient. If I can be of any further assistance to you please do not hesitate to call me at [phone].  Sincerely,   True Mar, MD, PhD

## 2024-07-02 ENCOUNTER — Ambulatory Visit: Admitting: Neurology

## 2024-07-02 DIAGNOSIS — G4734 Idiopathic sleep related nonobstructive alveolar hypoventilation: Secondary | ICD-10-CM

## 2024-07-02 DIAGNOSIS — G4733 Obstructive sleep apnea (adult) (pediatric): Secondary | ICD-10-CM

## 2024-07-02 DIAGNOSIS — Z9189 Other specified personal risk factors, not elsewhere classified: Secondary | ICD-10-CM

## 2024-07-02 DIAGNOSIS — G4700 Insomnia, unspecified: Secondary | ICD-10-CM

## 2024-07-02 DIAGNOSIS — R351 Nocturia: Secondary | ICD-10-CM

## 2024-07-02 DIAGNOSIS — G4719 Other hypersomnia: Secondary | ICD-10-CM

## 2024-07-02 DIAGNOSIS — E669 Obesity, unspecified: Secondary | ICD-10-CM

## 2024-07-02 DIAGNOSIS — R0683 Snoring: Secondary | ICD-10-CM

## 2024-07-02 DIAGNOSIS — I350 Nonrheumatic aortic (valve) stenosis: Secondary | ICD-10-CM

## 2024-07-03 NOTE — Progress Notes (Unsigned)
 SABRA

## 2024-07-04 ENCOUNTER — Ambulatory Visit: Payer: Self-pay | Admitting: Neurology

## 2024-07-04 DIAGNOSIS — G4733 Obstructive sleep apnea (adult) (pediatric): Secondary | ICD-10-CM

## 2024-07-04 DIAGNOSIS — G4734 Idiopathic sleep related nonobstructive alveolar hypoventilation: Secondary | ICD-10-CM

## 2024-07-04 NOTE — Procedures (Signed)
 [facility]  HOME SLEEP TEST (Watch PAT) REPORT  STUDY DATE: 07/02/2024  DOB: Jan 21, 1951  MRN: [id number]  ORDERING CLINICIAN: True Mar, MD, PhD   REFERRING CLINICIAN: Stephane Leita DEL, MD   CLINICAL INFORMATION/HISTORY (obtained from visit note dated 06/19/2024): 72 year old female with an underlying medical history of degenerative disc disease, arthritis with status post bilateral knee replacements, reflux disease, diabetes, hypertension, hyperlipidemia, thoracic and aortic aneurysm, aortic stenosis, carotid artery stenosis, hypothyroidism, constipation, and obesity, who reports snoring and excessive daytime somnolence.   Epworth sleepiness score: 19/24.  BMI: 36.4 kg/m  FINDINGS:   Sleep Summary:   Total Recording Time (hours, min): 6 hours, 59 min  Total Sleep Time (hours, min):  6 hours, 24 min  Percent REM (%):    30.3%   Respiratory Indices:   Calculated pAHI (per hour):  22.2/hour         REM pAHI:    39.8/hour       NREM pAHI: 14.4/hour  Central pAHI: 0.5/hour  Oxygen Saturation Statistics:    Oxygen Saturation (%) Mean: 92%   Minimum oxygen saturation (%):                 80%   O2 Saturation Range (%): 80-99%    O2 Saturation (minutes) <=88%: 15.7 min  Pulse Rate Statistics:   Pulse Mean (bpm):    63/min    Pulse Range (50-89/min)   IMPRESSION:   1. OSA (obstructive sleep apnea) 2. Nocturnal Hypoxemia   RECOMMENDATION:  This home sleep test demonstrates moderate  obstructive sleep apnea with a total AHI of 22.2/hour and O2 nadir of 80% with significant time below or at 88% saturation of over 15 minutes for the study, indicating nocturnal hypoxemia. Snoring was detected, in the moderate to loud range.  Treatment with positive airway pressure is recommended. The patient will be advised to proceed with an autoPAP titration/trial at home. A laboratory attended titration study can be considered in the future for optimization  of treatment settings and to improve tolerance and compliance, if needed, down the road. Alternative treatment options are limited secondary to the degree of the patient's sleep disordered breathing, but may include surgical treatment with an implantable hypoglossal nerve stimulator (in carefully selected candidates, meeting criteria).  Concomitant weight loss is recommended (where clinically appropriate).  Please note, that untreated obstructive sleep apnea may carry additional perioperative morbidity. Patients with significant obstructive sleep apnea should receive perioperative PAP therapy and the surgeons and particularly the anesthesiologist should be informed of the diagnosis and the severity of the sleep disordered breathing. The patient should be cautioned not to drive, work at heights, or operate dangerous or heavy equipment when tired or sleepy. Review and reiteration of good sleep hygiene measures should be pursued with any patient. Other causes of the patient's symptoms, including circadian rhythm disturbances, an underlying mood disorder, medication effect and/or an underlying medical problem cannot be ruled out based on this test. Clinical correlation is recommended.  The patient and her referring provider will be notified of the test results. The patient will be seen in follow up in sleep clinic at [facility].  I certify that I have reviewed the raw data recording prior to the issuance of this report in accordance with the standards of the American Academy of Sleep Medicine (AASM).  INTERPRETING PHYSICIAN:   True Mar, MD, PhD Medical Director, [facility]) Diplomat, ABPN (Neurology and Sleep)   [facility] [phone] 3rd  [address], [address] [place], [place] [address] [phone]

## 2024-07-08 NOTE — Telephone Encounter (Signed)
-----   Message from True Mar sent at 07/04/2024 12:49 PM EDT ----- Patient referred by PCP, seen by me on 06/19/2024, patient had a HST on 07/02/2024.    Please call and notify the patient that the recent home sleep test showed obstructive sleep apnea in the moderate range. I recommend treatment in the form of autoPAP, which means, that we don't have  to bring her in for a sleep study with CPAP, but will let her start using a so called autoPAP machine at home, which is a CPAP-like machine with self-adjusting pressures. We will send the order to a  local DME company (of her choice, or as per insurance requirement). The DME representative will fit her with a mask, educate her on how to use the machine, how to put the mask on, etc. I have placed  an order in the chart. Please send the order, talk to patient, send report to referring MD. We will need a FU in sleep clinic for 10 weeks post-PAP set up, please arrange that with me or one of our  NPs. Also reinforce the need for compliance with treatment. Thanks,   True Mar, MD, PhD [facility])    ----- Message ----- From: Mar True, MD Sent: 07/04/2024  12:47 PM EDT To: True Mar, MD

## 2024-07-08 NOTE — Telephone Encounter (Signed)
 Spoke with patient and discussed sleep study results as noted below by Dr Buck. The patient verbalized understanding and agreed to autopap setup. We discussed the insurance compliance requirements which includes using the machine at least 4 hours at night and also being seen between 30 and 90 days after setup. Pt was scheduled for an initial f/u on 09/18/24 at 1245 pm arrive at 1230. She preferred to use a DME close to home. Will refer to [facility] as they have an [place] location. Pt was advised to watch for a call within 1 week and to call us  if she doesn't hear by then. Pt thanked me for the call.   Referral sent to [facility]. Sleep study report sent to referring provider.

## 2024-07-10 NOTE — Telephone Encounter (Signed)
 New, Maryella Shivers, Otilio Jefferson, RN; Alain Honey; Jeris Penta, [place]; 1 other Received, thank you!

## 2024-08-04 ENCOUNTER — Telehealth: Payer: Self-pay | Admitting: Neurology

## 2024-08-04 NOTE — Telephone Encounter (Signed)
 Patient left a voicemail on the phone stating she has been using the CPAP for about a week now and needs to make a f/u appt.

## 2024-08-04 NOTE — Telephone Encounter (Signed)
 Pt is already scheduled. Called and reminded pt of date and time. Pt verbalized appreciation.

## 2024-08-07 DIAGNOSIS — Z01419 Encounter for gynecological examination (general) (routine) without abnormal findings: Secondary | ICD-10-CM | POA: Diagnosis not present

## 2024-08-07 DIAGNOSIS — Z1231 Encounter for screening mammogram for malignant neoplasm of breast: Secondary | ICD-10-CM | POA: Diagnosis not present

## 2024-08-12 ENCOUNTER — Telehealth: Payer: Self-pay | Admitting: Neurology

## 2024-08-12 DIAGNOSIS — E1159 Type 2 diabetes mellitus with other circulatory complications: Secondary | ICD-10-CM | POA: Diagnosis not present

## 2024-08-12 NOTE — Telephone Encounter (Signed)
 Pt called wanting to know if her Initial Cpap appt can be Virtual due to having a long way to drive to get to the office. Please advise.

## 2024-08-13 ENCOUNTER — Encounter: Payer: Self-pay | Admitting: Cardiology

## 2024-08-13 NOTE — Telephone Encounter (Signed)
 Is it okay to make pt's Initial CPAP F/U a VV?

## 2024-08-20 NOTE — Telephone Encounter (Signed)
 Called pt back and let her know that her insurance won't cover for her to have a VV and she would have to come in office. Pt verbalized understanding and Thanked me for calling.

## 2024-09-16 ENCOUNTER — Ambulatory Visit: Admitting: Physician Assistant

## 2024-09-17 NOTE — Progress Notes (Signed)
 SABRA

## 2024-09-18 ENCOUNTER — Encounter: Payer: Self-pay | Admitting: Neurology

## 2024-09-18 ENCOUNTER — Ambulatory Visit: Admitting: Neurology

## 2024-09-18 VITALS — BP 132/78 | HR 70 | Ht 63.0 in | Wt 206.0 lb

## 2024-09-18 DIAGNOSIS — G4733 Obstructive sleep apnea (adult) (pediatric): Secondary | ICD-10-CM

## 2024-09-18 NOTE — Progress Notes (Signed)
 "  Patient: Erica Calhoun Date of Birth: April 11, 1951  Reason for Visit: Follow up History from: Patient Primary Neurologist: Buck  ASSESSMENT AND PLAN 74 y.o. year old female   1.  OSA on CPAP  - Current pressure 6-12 cmH2O, AHI 17.8, leak 49. - I will turn the pressure range down to 6-10 cmH2O - She just had a mask refit - I will pull a download in 1 month to review the data - She is frustrated with CPAP but is encouraged how much better she is feeling.  Motivated to continue nightly use minimum 4 hours - Indicates there is no way she would be able to do a CPAP titration if needed as she has an elderly dog - Follow-up in 6 months  HISTORY OF PRESENT ILLNESS: Today 09/18/24 HST 07/02/2024 showed moderate obstructive sleep apnea with a total AHI of 22.2/hour and O2 nadir of 80% with significant time below or at 88% saturation of over 15 minutes for the study, indicating nocturnal hypoxemia. 100% compliance, 6-12 cm water , AHI 17.8, leak 49. Setup 07/28/24. Has tried 2 different mask, right now FFM under the nose. Has only been using for 1 week. 1st mask was leaking a lot. Stress with using CPAP, DME kept calling reporting leak/usage issues. Sleeping better, waking up less at night, has energy during the day. Other than being frustrated with CPAP process, pleased with how CPAP is making her feel. ESS 4.  HISTORY  06/19/24 Dr. Buck: I saw your patient, Erica Calhoun, upon your kind request in my sleep clinic today for initial consultation of her sleep disorder, in particular, concern for underlying obstructive sleep apnea.  The patient is unaccompanied today.  As you know, Erica Calhoun is a 74 year old female with an underlying medical history of degenerative disc disease, arthritis with status post bilateral knee replacements, reflux disease, diabetes, hypertension, hyperlipidemia, thoracic and aortic aneurysm, aortic stenosis, carotid artery stenosis, hypothyroidism, constipation, and  obesity, who reports snoring and excessive daytime somnolence.  Her Epworth sleepiness score is 19 out of 24, fatigue severity score is 63 out of 63.  Her symptoms have become worse over the past year.  She has a longstanding history of snoring.  Her husband had mentioned it to her in the past.  She is widowed and lives alone, she has 4 grown children, she has 1 elderly dog in the household and 2 cats.  She would not be able to leave the dog alone overnight as the dog has separation anxiety and seizures.  She is a restless sleeper and wakes up in the middle of the night, also has trouble going to sleep.  She sometimes takes PM type medication or generic Benadryl .  She goes to bed somewhere between 10 and midnight and rise time is between 7 and 9.  She has nocturia about twice per average night.  She denies recurrent nocturnal or morning headaches.  She is a non-smoker.  She drinks caffeine in the form of coffee and green tea, 1 serving each on average.  She does not drink any alcohol .  She is not aware of any family history of sleep apnea but her father snored.  She does not have a TV in her bedroom.  She has had weight fluctuation, currently working on weight loss.   REVIEW OF SYSTEMS: Out of a complete 14 system review of symptoms, the patient complains only of the following symptoms, and all other reviewed systems are negative.  See HPI  ALLERGIES: Allergies[1]  HOME MEDICATIONS: Outpatient Medications Prior to Visit  Medication Sig Dispense Refill   Alirocumab  (PRALUENT ) 150 MG/ML SOAJ INJECT 1 ML UNDER THE SKIN EVERY 14 DAYS 6 mL 3   amLODipine (NORVASC) 5 MG tablet Take 5 mg by mouth daily.     Ascorbic Acid  (VITAMIN C) 1000 MG tablet Take 1,000 mg by mouth 2 (two) times daily.     aspirin  EC 81 MG tablet Take 1 tablet (81 mg total) by mouth at bedtime. Swallow whole. 30 tablet 11   Bempedoic Acid  (NEXLETOL ) 180 MG TABS Take 1 tablet (180 mg total) by mouth daily. 90 tablet 3    carboxymethylcellulose (REFRESH PLUS) 0.5 % SOLN Apply 1-2 drops to eye 2 (two) times daily as needed (dry/irritated eyes.).     cyclobenzaprine  (FLEXERIL ) 10 MG tablet Take 1 tablet (10 mg total) by mouth 3 (three) times daily as needed for muscle spasms. 60 tablet 2   ezetimibe  (ZETIA ) 10 MG tablet Take 1 tablet (10 mg total) by mouth in the morning. 90 tablet 3   fluticasone  (FLONASE ) 50 MCG/ACT nasal spray Place 1 spray into both nostrils daily as needed for allergies (sinus issues.).     Glucosamine-Chondroitin (COSAMIN DS PO) Take 1 capsule by mouth in the morning and at bedtime.     hydrochlorothiazide  (MICROZIDE ) 12.5 MG capsule Take 12.5 mg by mouth every evening.     irbesartan (AVAPRO) 300 MG tablet Take 300 mg by mouth daily.     levothyroxine  (SYNTHROID ) 100 MCG tablet Take 100 mcg by mouth every other day. In the morning on an empty stomach     metFORMIN (GLUCOPHAGE-XR) 500 MG 24 hr tablet Take 500 mg by mouth. 2 tablets in the evening     metoprolol  succinate (TOPROL -XL) 25 MG 24 hr tablet Take 25 mg by mouth in the morning.     Multiple Vitamins-Minerals (HAIR VITAMINS PO) Take 1 capsule by mouth in the morning and at bedtime. Nutrafol     omeprazole (PRILOSEC) 20 MG capsule 1 capsule 1/2 to 1 hour before morning meal Orally Once a day     polyethylene glycol (MIRALAX  / GLYCOLAX ) packet Take 17 g by mouth 2 (two) times daily. 14 each 0   spironolactone (ALDACTONE) 25 MG tablet Take 25 mg by mouth daily.     traMADol  (ULTRAM ) 50 MG tablet Take 1 tablet (50 mg total) by mouth every 6 (six) hours as needed (pain.). 60 tablet 0   triamcinolone  cream (KENALOG ) 0.1 % Apply 1 application topically daily as needed (skin irritation/rash).     Wheat Dextrin (BENEFIBER) POWD Take 1 Scoop by mouth in the morning. Mixed with miralax      Zinc 50 MG TABS 1 tablet Orally Once a day     Vitamin D , Ergocalciferol , (DRISDOL ) 50000 units CAPS capsule Take 50,000 Units by mouth every Friday. (Patient  not taking: Reported on 09/18/2024)     No facility-administered medications prior to visit.    PAST MEDICAL HISTORY: Past Medical History:  Diagnosis Date   Acquired dilation of ascending aorta and aortic root (HCC    42mm ascending aorta by echo 10/2023   Aortic atherosclerosis    Aortic insufficiency    mild to moderate by echo 10/2023   Aortic stenosis    mild by echo 10/2023   Arthritis    cervical and lower back    Carotid artery stenosis    1-39% right and 40-59% left carotid artery stenosis by dopplers 10/2023   Chest  pain    s/p LHC 11/28/16: 10% mLAd, dLAD, mCx, EF 60%, very mild AS (peak grad 16) - cardiologist Dr. Blanca   Dyslipidemia    Fibromyalgia    Heart murmur    History of hiatal hernia    History of recurrent UTIs    Hypertension    Hypothyroidism    Mitral stenosis    mild by echo 10/2023 mean MVG   Osteopenia    Pneumonia    hx/1/17   Sinusitis    Ureter obstruction    right side, back in 1980's    No problems since    PAST SURGICAL HISTORY: Past Surgical History:  Procedure Laterality Date   ANTERIOR CERVICAL DECOMP/DISCECTOMY FUSION N/A 10/02/2016   Procedure: Anterior Cervical Diskectomy and Fusion - Cervical four- Cervical five - Cervical five-Cervical six - Cervical six -Cervical seven;  Surgeon: Arley Helling, MD;  Location: [facility] OR;  Service: Neurosurgery;  Laterality: N/A;   ANTERIOR CERVICAL DECOMP/DISCECTOMY FUSION N/A 12/31/2020   Procedure: Anterior Cervical Decompression/Discectomy Fusion Cervical three-four with removal of hardware Cervical four-five;  Surgeon: Helling Arley, MD;  Location: [facility] OR;  Service: Neurosurgery;  Laterality: N/A;   ANTERIOR LAT LUMBAR FUSION Left 11/05/2017   Procedure: LUMBAR TWO-THREE ANTERIOR LATERAL LUMBAR FUSION WITH LATERAL PLATE;  Surgeon: Helling Arley, MD;  Location: [facility] OR;  Service: Neurosurgery;  Laterality: Left;   APPENDECTOMY  1978   BUNIONECTOMY Bilateral    CARDIAC CATHETERIZATION     2018 with Dr. Blanca    cyst on ovary     left ovary.SABRA GRIMES N/A 01/23/2018   Procedure: CYSTOSCOPY;  Surgeon: Estelle Service, MD;  Location: [facility] ORS;  Service: Gynecology;  Laterality: N/A;   JOINT REPLACEMENT     bilateral total knees Dr. Ernie 06-20-18   LAPAROSCOPIC VAGINAL HYSTERECTOMY WITH SALPINGO OOPHORECTOMY Bilateral 01/23/2018   Procedure: LAPAROSCOPIC ASSISTED VAGINAL HYSTERECTOMY WITH SALPINGO OOPHORECTOMY;  Surgeon: Estelle Service, MD;  Location: [facility] ORS;  Service: Gynecology;  Laterality: Bilateral;   LEFT HEART CATH AND CORONARY ANGIOGRAPHY N/A 11/28/2016   Procedure: Left Heart Cath and Coronary Angiography;  Surgeon: Debby DELENA Sor, MD;  Location: [facility] INVASIVE CV LAB;  Service: Cardiovascular;  Laterality: N/A;   TOTAL KNEE ARTHROPLASTY Bilateral 06/20/2018   Procedure: TOTAL KNEE BILATERAL;  Surgeon: Ernie Cough, MD;  Location: [facility] ORS;  Service: Orthopedics;  Laterality: Bilateral;  2.5 hrs   TUBAL LIGATION     wisdon teeth      FAMILY HISTORY: Family History  Problem Relation Age of Onset   Goiter Mother    Bladder Cancer Father    Bladder Cancer Brother    Colon cancer Maternal Grandmother    Stomach cancer Neg Hx    Rectal cancer Neg Hx    Esophageal cancer Neg Hx    Pancreatic cancer Neg Hx    Sleep apnea Neg Hx     SOCIAL HISTORY: Social History   Socioeconomic History   Marital status: Widowed    Spouse name: Not on file   Number of children: Not on file   Years of education: Not on file   Highest education level: Not on file  Occupational History   Not on file  Tobacco Use   Smoking status: Never   Smokeless tobacco: Never  Vaping Use   Vaping status: Never Used  Substance and Sexual Activity   Alcohol  use: No    Comment: quit 25's age   Drug use: No   Sexual activity: Yes  Other Topics Concern   Not on file  Social History Narrative   1 cup of coffee in the morning, and 1 cup of green or black tea in the evening    Social Drivers of Health    Tobacco Use: Low Risk (09/18/2024)   Patient History    Smoking Tobacco Use: Never    Smokeless Tobacco Use: Never    Passive Exposure: Not on file  Financial Resource Strain: Not on file  Food Insecurity: Not on file  Transportation Needs: Not on file  Physical Activity: Not on file  Stress: Not on file  Social Connections: Not on file  Intimate Partner Violence: Not on file  Depression (EYV7-0): Not on file  Alcohol  Screen: Not on file  Housing: Not on file  Utilities: Not on file  Health Literacy: Not on file   PHYSICAL EXAM  Vitals:   09/18/24 1247  BP: 132/78  Pulse: 70  SpO2: 98%  Weight: 206 lb (93.4 kg)  Height: 5' 3 (1.6 m)   Body mass index is 36.49 kg/m.  Generalized: Well developed, in no acute distress  Neurological examination  Mentation: Alert oriented to time, place, history taking. Follows all commands speech and language fluent Cranial nerve II-XII: Pupils were equal round reactive to light.  Motor: The motor testing reveals 5 over 5 strength of all 4 extremities. Good symmetric motor tone is noted throughout.  Gait and station: Gait is normal.    DIAGNOSTIC DATA (LABS, IMAGING, TESTING) - I reviewed patient records, labs, notes, testing and imaging myself where available.  Lab Results  Component Value Date   WBC 7.3 12/28/2020   HGB 13.8 12/28/2020   HCT 40.9 12/28/2020   MCV 87.8 12/28/2020   PLT 211 12/28/2020      Component Value Date/Time   NA 138 12/28/2020 1343   K 4.2 12/28/2020 1343   CL 102 12/28/2020 1343   CO2 29 12/28/2020 1343   GLUCOSE 113 (H) 12/28/2020 1343   BUN 12 12/28/2020 1343   CREATININE 0.60 12/28/2020 1343   CALCIUM 9.6 12/28/2020 1343   PROT 7.0 01/14/2018 1125   ALBUMIN 4.3 01/14/2018 1125   AST 21 01/14/2018 1125   ALT 21 12/15/2020 1050   ALKPHOS 78 01/14/2018 1125   BILITOT 0.6 01/14/2018 1125   GFRNONAA 82.5 05/15/2023 1139   GFRAA >60 06/22/2018 0505   Lab Results  Component Value Date   CHOL  128 12/31/2023   HDL 50 12/31/2023   LDLCALC 55 12/31/2023   TRIG 130 12/31/2023   CHOLHDL 2.6 12/31/2023   No results found for: HGBA1C No results found for: VITAMINB12 No results found for: TSH  Lauraine Born, AGNP-C, DNP 09/18/2024, 1:11 PM [facility] [address] [place], [place] [address] [phone]      [1]  Allergies Allergen Reactions   Penicillins Shortness Of Breath, Swelling, Rash and Other (See Comments)    Has patient had a PCN reaction causing immediate rash, facial/tongue/throat swelling, SOB or lightheadedness with hypotension:Yes Has patient had a PCN reaction causing severe rash involving mucus membranes or skin necrosis: No Has patient had a PCN reaction that required hospitalization No Has patient had a PCN reaction occurring within the last 10 years: No If all of the above answers are NO, then may proceed with Cephalosporin use.    Adhesive [Tape] Other (See Comments)    Blisters skin - MUST USE PAPER TAPE   Other Other (See Comments)    adhesive  tape   Aspirin  Other (See Comments)    In high doses causes ears rings    Codeine Other (See Comments)    headache   Morphine Other (See Comments)    headache   "

## 2024-09-18 NOTE — Patient Instructions (Signed)
 Nice to see you today.  I turned down the pressure range on your CPAP.  If you are continuing to see that your machine is showing a leak please let me know.  Avoid supine sleep.  I will pull a download in 1 month and reach out to you.  Otherwise follow-up in 6 months.  Thanks

## 2024-10-06 ENCOUNTER — Ambulatory Visit

## 2024-10-06 DIAGNOSIS — I350 Nonrheumatic aortic (valve) stenosis: Secondary | ICD-10-CM

## 2024-10-06 DIAGNOSIS — I050 Rheumatic mitral stenosis: Secondary | ICD-10-CM | POA: Diagnosis not present

## 2024-10-06 LAB — ECHOCARDIOGRAM COMPLETE
AR max vel: 1.36 cm2
AV Area VTI: 1.48 cm2
AV Area mean vel: 1.37 cm2
AV Mean grad: 10.8 mmHg
AV Peak grad: 20.4 mmHg
AV Vena cont: 0.6 cm
Ao pk vel: 2.26 m/s
Area-P 1/2: 1.88 cm2
MV VTI: 1.23 cm2
P 1/2 time: 629 ms
S' Lateral: 2.8 cm

## 2024-10-07 ENCOUNTER — Encounter: Payer: Self-pay | Admitting: Cardiology

## 2024-10-07 ENCOUNTER — Ambulatory Visit: Payer: Self-pay | Admitting: Cardiology

## 2024-10-08 ENCOUNTER — Other Ambulatory Visit: Payer: Self-pay

## 2024-10-08 DIAGNOSIS — I350 Nonrheumatic aortic (valve) stenosis: Secondary | ICD-10-CM

## 2024-10-09 ENCOUNTER — Other Ambulatory Visit: Payer: Self-pay | Admitting: *Deleted

## 2024-10-09 ENCOUNTER — Other Ambulatory Visit: Payer: Self-pay | Admitting: Cardiology

## 2024-10-09 MED ORDER — NEXLETOL 180 MG PO TABS: 180.0000 mg | ORAL_TABLET | Freq: Every day | ORAL | 0 refills | Status: AC

## 2025-04-21 ENCOUNTER — Ambulatory Visit: Admitting: Neurology
